# Patient Record
Sex: Male | Born: 1940 | Race: White | Hispanic: No | Marital: Married | Smoking: Former smoker
Health system: Southern US, Community
[De-identification: ages and names within clinical notes are randomized; demographics above are authoritative.]

## PROBLEM LIST (undated history)

## (undated) DIAGNOSIS — K219 Gastro-esophageal reflux disease without esophagitis: Secondary | ICD-10-CM

## (undated) DIAGNOSIS — K222 Esophageal obstruction: Secondary | ICD-10-CM

## (undated) DIAGNOSIS — M1990 Unspecified osteoarthritis, unspecified site: Secondary | ICD-10-CM

## (undated) DIAGNOSIS — M8580 Other specified disorders of bone density and structure, unspecified site: Secondary | ICD-10-CM

## (undated) DIAGNOSIS — K635 Polyp of colon: Secondary | ICD-10-CM

## (undated) DIAGNOSIS — G4730 Sleep apnea, unspecified: Secondary | ICD-10-CM

## (undated) DIAGNOSIS — H269 Unspecified cataract: Secondary | ICD-10-CM

## (undated) DIAGNOSIS — G2581 Restless legs syndrome: Secondary | ICD-10-CM

## (undated) DIAGNOSIS — G43909 Migraine, unspecified, not intractable, without status migrainosus: Secondary | ICD-10-CM

## (undated) DIAGNOSIS — K649 Unspecified hemorrhoids: Secondary | ICD-10-CM

## (undated) DIAGNOSIS — C4490 Unspecified malignant neoplasm of skin, unspecified: Secondary | ICD-10-CM

## (undated) DIAGNOSIS — E785 Hyperlipidemia, unspecified: Secondary | ICD-10-CM

## (undated) DIAGNOSIS — D509 Iron deficiency anemia, unspecified: Secondary | ICD-10-CM

## (undated) HISTORY — DX: Migraine, unspecified, not intractable, without status migrainosus: G43.909

## (undated) HISTORY — DX: Iron deficiency anemia, unspecified: D50.9

## (undated) HISTORY — DX: Gastro-esophageal reflux disease without esophagitis: K21.9

## (undated) HISTORY — DX: Other specified disorders of bone density and structure, unspecified site: M85.80

## (undated) HISTORY — DX: Unspecified cataract: H26.9

## (undated) HISTORY — DX: Unspecified hemorrhoids: K64.9

## (undated) HISTORY — PX: OTHER SURGICAL HISTORY: SHX169

## (undated) HISTORY — DX: Unspecified malignant neoplasm of skin, unspecified: C44.90

## (undated) HISTORY — PX: EYE SURGERY: SHX253

## (undated) HISTORY — PX: BREAST BIOPSY: SHX20

## (undated) HISTORY — DX: Restless legs syndrome: G25.81

## (undated) HISTORY — DX: Hyperlipidemia, unspecified: E78.5

## (undated) HISTORY — PX: ROTATOR CUFF REPAIR: SHX139

## (undated) HISTORY — PX: INGUINAL HERNIA REPAIR: SUR1180

## (undated) HISTORY — DX: Esophageal obstruction: K22.2

## (undated) HISTORY — DX: Polyp of colon: K63.5

## (undated) HISTORY — DX: Unspecified osteoarthritis, unspecified site: M19.90

---

## 1998-11-13 DIAGNOSIS — K635 Polyp of colon: Secondary | ICD-10-CM

## 1998-11-13 HISTORY — DX: Polyp of colon: K63.5

## 2004-11-13 DIAGNOSIS — K649 Unspecified hemorrhoids: Secondary | ICD-10-CM

## 2004-11-13 HISTORY — DX: Unspecified hemorrhoids: K64.9

## 2006-03-20 DIAGNOSIS — G2581 Restless legs syndrome: Secondary | ICD-10-CM

## 2006-03-20 DIAGNOSIS — D509 Iron deficiency anemia, unspecified: Secondary | ICD-10-CM

## 2006-03-20 HISTORY — DX: Iron deficiency anemia, unspecified: D50.9

## 2006-03-20 HISTORY — DX: Restless legs syndrome: G25.81

## 2011-01-02 DIAGNOSIS — H269 Unspecified cataract: Secondary | ICD-10-CM

## 2011-01-02 HISTORY — DX: Unspecified cataract: H26.9

## 2012-01-04 DIAGNOSIS — C4490 Unspecified malignant neoplasm of skin, unspecified: Secondary | ICD-10-CM

## 2012-01-04 HISTORY — DX: Unspecified malignant neoplasm of skin, unspecified: C44.90

## 2012-01-08 DIAGNOSIS — Z125 Encounter for screening for malignant neoplasm of prostate: Secondary | ICD-10-CM | POA: Diagnosis not present

## 2012-01-08 DIAGNOSIS — D509 Iron deficiency anemia, unspecified: Secondary | ICD-10-CM | POA: Diagnosis not present

## 2012-01-08 DIAGNOSIS — E7800 Pure hypercholesterolemia, unspecified: Secondary | ICD-10-CM | POA: Diagnosis not present

## 2012-01-24 DIAGNOSIS — K219 Gastro-esophageal reflux disease without esophagitis: Secondary | ICD-10-CM | POA: Diagnosis not present

## 2012-01-24 DIAGNOSIS — J3489 Other specified disorders of nose and nasal sinuses: Secondary | ICD-10-CM | POA: Diagnosis not present

## 2012-01-24 DIAGNOSIS — M899 Disorder of bone, unspecified: Secondary | ICD-10-CM | POA: Diagnosis not present

## 2012-01-24 DIAGNOSIS — D509 Iron deficiency anemia, unspecified: Secondary | ICD-10-CM | POA: Diagnosis not present

## 2012-01-24 DIAGNOSIS — M949 Disorder of cartilage, unspecified: Secondary | ICD-10-CM | POA: Diagnosis not present

## 2012-01-24 DIAGNOSIS — E7800 Pure hypercholesterolemia, unspecified: Secondary | ICD-10-CM | POA: Diagnosis not present

## 2012-01-24 DIAGNOSIS — R209 Unspecified disturbances of skin sensation: Secondary | ICD-10-CM | POA: Diagnosis not present

## 2012-02-08 DIAGNOSIS — D485 Neoplasm of uncertain behavior of skin: Secondary | ICD-10-CM | POA: Diagnosis not present

## 2012-02-08 DIAGNOSIS — L578 Other skin changes due to chronic exposure to nonionizing radiation: Secondary | ICD-10-CM | POA: Diagnosis not present

## 2012-02-08 DIAGNOSIS — L821 Other seborrheic keratosis: Secondary | ICD-10-CM | POA: Diagnosis not present

## 2012-02-08 DIAGNOSIS — L570 Actinic keratosis: Secondary | ICD-10-CM | POA: Diagnosis not present

## 2012-02-12 DIAGNOSIS — L570 Actinic keratosis: Secondary | ICD-10-CM | POA: Diagnosis not present

## 2012-02-12 DIAGNOSIS — D2330 Other benign neoplasm of skin of unspecified part of face: Secondary | ICD-10-CM | POA: Diagnosis not present

## 2012-02-29 DIAGNOSIS — L570 Actinic keratosis: Secondary | ICD-10-CM | POA: Diagnosis not present

## 2012-04-11 DIAGNOSIS — M545 Low back pain: Secondary | ICD-10-CM | POA: Diagnosis not present

## 2012-04-11 DIAGNOSIS — M999 Biomechanical lesion, unspecified: Secondary | ICD-10-CM | POA: Diagnosis not present

## 2012-04-29 DIAGNOSIS — M999 Biomechanical lesion, unspecified: Secondary | ICD-10-CM | POA: Diagnosis not present

## 2012-04-29 DIAGNOSIS — M546 Pain in thoracic spine: Secondary | ICD-10-CM | POA: Diagnosis not present

## 2012-05-28 DIAGNOSIS — M818 Other osteoporosis without current pathological fracture: Secondary | ICD-10-CM | POA: Diagnosis not present

## 2012-05-28 DIAGNOSIS — Z7983 Long term (current) use of bisphosphonates: Secondary | ICD-10-CM | POA: Diagnosis not present

## 2012-05-28 DIAGNOSIS — Z5181 Encounter for therapeutic drug level monitoring: Secondary | ICD-10-CM | POA: Diagnosis not present

## 2012-05-28 DIAGNOSIS — M8440XA Pathological fracture, unspecified site, initial encounter for fracture: Secondary | ICD-10-CM | POA: Diagnosis not present

## 2012-05-28 DIAGNOSIS — Z713 Dietary counseling and surveillance: Secondary | ICD-10-CM | POA: Diagnosis not present

## 2012-05-28 DIAGNOSIS — M8448XA Pathological fracture, other site, initial encounter for fracture: Secondary | ICD-10-CM | POA: Diagnosis not present

## 2012-06-22 DIAGNOSIS — S61209A Unspecified open wound of unspecified finger without damage to nail, initial encounter: Secondary | ICD-10-CM | POA: Diagnosis not present

## 2012-07-01 DIAGNOSIS — T148XXA Other injury of unspecified body region, initial encounter: Secondary | ICD-10-CM | POA: Diagnosis not present

## 2012-07-01 DIAGNOSIS — T63481A Toxic effect of venom of other arthropod, accidental (unintentional), initial encounter: Secondary | ICD-10-CM | POA: Diagnosis not present

## 2012-07-01 DIAGNOSIS — T6391XA Toxic effect of contact with unspecified venomous animal, accidental (unintentional), initial encounter: Secondary | ICD-10-CM | POA: Diagnosis not present

## 2012-07-01 DIAGNOSIS — T63461A Toxic effect of venom of wasps, accidental (unintentional), initial encounter: Secondary | ICD-10-CM | POA: Diagnosis not present

## 2012-07-15 DIAGNOSIS — Z7982 Long term (current) use of aspirin: Secondary | ICD-10-CM | POA: Diagnosis not present

## 2012-07-15 DIAGNOSIS — M79609 Pain in unspecified limb: Secondary | ICD-10-CM | POA: Diagnosis not present

## 2012-07-15 DIAGNOSIS — X58XXXA Exposure to other specified factors, initial encounter: Secondary | ICD-10-CM | POA: Diagnosis not present

## 2012-07-15 DIAGNOSIS — S5000XA Contusion of unspecified elbow, initial encounter: Secondary | ICD-10-CM | POA: Diagnosis not present

## 2012-07-15 DIAGNOSIS — I10 Essential (primary) hypertension: Secondary | ICD-10-CM | POA: Diagnosis not present

## 2012-07-15 DIAGNOSIS — S40029A Contusion of unspecified upper arm, initial encounter: Secondary | ICD-10-CM | POA: Diagnosis not present

## 2012-07-15 DIAGNOSIS — Z79899 Other long term (current) drug therapy: Secondary | ICD-10-CM | POA: Diagnosis not present

## 2012-08-05 DIAGNOSIS — D509 Iron deficiency anemia, unspecified: Secondary | ICD-10-CM | POA: Diagnosis not present

## 2012-08-05 DIAGNOSIS — Z125 Encounter for screening for malignant neoplasm of prostate: Secondary | ICD-10-CM | POA: Diagnosis not present

## 2012-08-05 DIAGNOSIS — C4442 Squamous cell carcinoma of skin of scalp and neck: Secondary | ICD-10-CM | POA: Diagnosis not present

## 2012-08-05 DIAGNOSIS — E559 Vitamin D deficiency, unspecified: Secondary | ICD-10-CM | POA: Diagnosis not present

## 2012-08-05 DIAGNOSIS — K219 Gastro-esophageal reflux disease without esophagitis: Secondary | ICD-10-CM | POA: Diagnosis not present

## 2012-08-05 DIAGNOSIS — E7800 Pure hypercholesterolemia, unspecified: Secondary | ICD-10-CM | POA: Diagnosis not present

## 2012-08-07 DIAGNOSIS — L578 Other skin changes due to chronic exposure to nonionizing radiation: Secondary | ICD-10-CM | POA: Diagnosis not present

## 2012-08-07 DIAGNOSIS — L570 Actinic keratosis: Secondary | ICD-10-CM | POA: Diagnosis not present

## 2012-08-07 DIAGNOSIS — L821 Other seborrheic keratosis: Secondary | ICD-10-CM | POA: Diagnosis not present

## 2012-08-07 DIAGNOSIS — D485 Neoplasm of uncertain behavior of skin: Secondary | ICD-10-CM | POA: Diagnosis not present

## 2012-08-09 DIAGNOSIS — L988 Other specified disorders of the skin and subcutaneous tissue: Secondary | ICD-10-CM | POA: Diagnosis not present

## 2012-09-03 DIAGNOSIS — H26499 Other secondary cataract, unspecified eye: Secondary | ICD-10-CM | POA: Diagnosis not present

## 2012-09-03 DIAGNOSIS — H43819 Vitreous degeneration, unspecified eye: Secondary | ICD-10-CM | POA: Diagnosis not present

## 2012-09-03 DIAGNOSIS — H04129 Dry eye syndrome of unspecified lacrimal gland: Secondary | ICD-10-CM | POA: Diagnosis not present

## 2012-09-03 DIAGNOSIS — D3130 Benign neoplasm of unspecified choroid: Secondary | ICD-10-CM | POA: Diagnosis not present

## 2012-09-13 DIAGNOSIS — Z23 Encounter for immunization: Secondary | ICD-10-CM | POA: Diagnosis not present

## 2012-12-10 DIAGNOSIS — R209 Unspecified disturbances of skin sensation: Secondary | ICD-10-CM | POA: Diagnosis not present

## 2013-01-23 DIAGNOSIS — L578 Other skin changes due to chronic exposure to nonionizing radiation: Secondary | ICD-10-CM | POA: Diagnosis not present

## 2013-01-31 DIAGNOSIS — M546 Pain in thoracic spine: Secondary | ICD-10-CM | POA: Diagnosis not present

## 2013-01-31 DIAGNOSIS — M999 Biomechanical lesion, unspecified: Secondary | ICD-10-CM | POA: Diagnosis not present

## 2013-02-11 DIAGNOSIS — L570 Actinic keratosis: Secondary | ICD-10-CM | POA: Diagnosis not present

## 2013-04-29 DIAGNOSIS — M546 Pain in thoracic spine: Secondary | ICD-10-CM | POA: Diagnosis not present

## 2013-04-29 DIAGNOSIS — M999 Biomechanical lesion, unspecified: Secondary | ICD-10-CM | POA: Diagnosis not present

## 2013-05-15 DIAGNOSIS — N63 Unspecified lump in unspecified breast: Secondary | ICD-10-CM | POA: Diagnosis not present

## 2013-05-15 DIAGNOSIS — M84429A Pathological fracture, unspecified humerus, initial encounter for fracture: Secondary | ICD-10-CM | POA: Diagnosis not present

## 2013-05-15 DIAGNOSIS — R079 Chest pain, unspecified: Secondary | ICD-10-CM | POA: Diagnosis not present

## 2013-05-15 DIAGNOSIS — M818 Other osteoporosis without current pathological fracture: Secondary | ICD-10-CM | POA: Diagnosis not present

## 2013-05-19 LAB — HM DEXA SCAN

## 2013-05-22 DIAGNOSIS — R079 Chest pain, unspecified: Secondary | ICD-10-CM | POA: Diagnosis not present

## 2013-05-26 DIAGNOSIS — H43819 Vitreous degeneration, unspecified eye: Secondary | ICD-10-CM | POA: Diagnosis not present

## 2013-05-29 DIAGNOSIS — Z5181 Encounter for therapeutic drug level monitoring: Secondary | ICD-10-CM | POA: Diagnosis not present

## 2013-05-29 DIAGNOSIS — Z7983 Long term (current) use of bisphosphonates: Secondary | ICD-10-CM | POA: Diagnosis not present

## 2013-05-29 DIAGNOSIS — M810 Age-related osteoporosis without current pathological fracture: Secondary | ICD-10-CM | POA: Diagnosis not present

## 2013-05-29 DIAGNOSIS — M8448XA Pathological fracture, other site, initial encounter for fracture: Secondary | ICD-10-CM | POA: Diagnosis not present

## 2013-06-05 DIAGNOSIS — M810 Age-related osteoporosis without current pathological fracture: Secondary | ICD-10-CM | POA: Diagnosis not present

## 2013-06-05 DIAGNOSIS — M8448XA Pathological fracture, other site, initial encounter for fracture: Secondary | ICD-10-CM | POA: Diagnosis not present

## 2013-06-05 DIAGNOSIS — Z7983 Long term (current) use of bisphosphonates: Secondary | ICD-10-CM | POA: Diagnosis not present

## 2013-06-16 DIAGNOSIS — D509 Iron deficiency anemia, unspecified: Secondary | ICD-10-CM | POA: Diagnosis not present

## 2013-06-16 DIAGNOSIS — E7800 Pure hypercholesterolemia, unspecified: Secondary | ICD-10-CM | POA: Diagnosis not present

## 2013-06-16 DIAGNOSIS — Z125 Encounter for screening for malignant neoplasm of prostate: Secondary | ICD-10-CM | POA: Diagnosis not present

## 2013-06-16 DIAGNOSIS — K219 Gastro-esophageal reflux disease without esophagitis: Secondary | ICD-10-CM | POA: Diagnosis not present

## 2013-06-24 DIAGNOSIS — M999 Biomechanical lesion, unspecified: Secondary | ICD-10-CM | POA: Diagnosis not present

## 2013-06-24 DIAGNOSIS — M545 Low back pain: Secondary | ICD-10-CM | POA: Diagnosis not present

## 2013-07-01 DIAGNOSIS — H43819 Vitreous degeneration, unspecified eye: Secondary | ICD-10-CM | POA: Diagnosis not present

## 2013-07-01 DIAGNOSIS — H04129 Dry eye syndrome of unspecified lacrimal gland: Secondary | ICD-10-CM | POA: Diagnosis not present

## 2013-07-01 DIAGNOSIS — H01009 Unspecified blepharitis unspecified eye, unspecified eyelid: Secondary | ICD-10-CM | POA: Diagnosis not present

## 2013-07-08 DIAGNOSIS — E7800 Pure hypercholesterolemia, unspecified: Secondary | ICD-10-CM | POA: Diagnosis not present

## 2013-07-08 DIAGNOSIS — Z125 Encounter for screening for malignant neoplasm of prostate: Secondary | ICD-10-CM | POA: Diagnosis not present

## 2013-07-28 DIAGNOSIS — L821 Other seborrheic keratosis: Secondary | ICD-10-CM | POA: Diagnosis not present

## 2013-07-28 DIAGNOSIS — D485 Neoplasm of uncertain behavior of skin: Secondary | ICD-10-CM | POA: Diagnosis not present

## 2013-07-28 DIAGNOSIS — L578 Other skin changes due to chronic exposure to nonionizing radiation: Secondary | ICD-10-CM | POA: Diagnosis not present

## 2013-07-28 DIAGNOSIS — L570 Actinic keratosis: Secondary | ICD-10-CM | POA: Diagnosis not present

## 2013-07-31 DIAGNOSIS — M999 Biomechanical lesion, unspecified: Secondary | ICD-10-CM | POA: Diagnosis not present

## 2013-07-31 DIAGNOSIS — M545 Low back pain: Secondary | ICD-10-CM | POA: Diagnosis not present

## 2013-08-01 DIAGNOSIS — L570 Actinic keratosis: Secondary | ICD-10-CM | POA: Diagnosis not present

## 2013-08-19 DIAGNOSIS — Z23 Encounter for immunization: Secondary | ICD-10-CM | POA: Diagnosis not present

## 2013-10-13 ENCOUNTER — Encounter: Payer: Self-pay | Admitting: Adult Health

## 2013-10-13 ENCOUNTER — Ambulatory Visit (INDEPENDENT_AMBULATORY_CARE_PROVIDER_SITE_OTHER): Payer: Medicare Other | Admitting: Adult Health

## 2013-10-13 VITALS — BP 116/78 | HR 73 | Temp 97.5°F | Resp 14 | Ht 70.0 in | Wt 213.0 lb

## 2013-10-13 DIAGNOSIS — E785 Hyperlipidemia, unspecified: Secondary | ICD-10-CM | POA: Diagnosis not present

## 2013-10-13 DIAGNOSIS — Z7689 Persons encountering health services in other specified circumstances: Secondary | ICD-10-CM | POA: Insufficient documentation

## 2013-10-13 MED ORDER — CVS OMEGA-3 PO CAPS: ORAL_CAPSULE | ORAL | Status: DC

## 2013-10-13 NOTE — Assessment & Plan Note (Addendum)
Reviewed past medical, surgical, family and social hx. Request records from previous PCP in [place]. Normal exam.

## 2013-10-13 NOTE — Progress Notes (Signed)
Subjective:    Patient ID: Jamie Zhang, male    DOB: 1941/10/16, 72 y.o.   MRN: [id number]  HPI  [name] presents to clinic to establish care. He recently moved to this area from [place] with his wife. Currently feeling well. Reports hx of mild HLD.    Past Medical History  Diagnosis Date  . Cataract 01/02/2011    Bilateral - s/p excision  . Skin cancer 01/04/2012    Basal cell nose, squamous cell scalp - Dr. Herma Ard  . Iron deficiency anemia 03/20/2006    Donating too much blood  . Migraine     No longer has problems  . HLD (hyperlipidemia)   . Colon polyps 2000    Hyperplastic - Repeat colonoscopy 2015  . Schatzki's ring   . GERD (gastroesophageal reflux disease)     Pantoprazole  . Osteopenia     on Fosamax  . Hemorrhoids 2006  . RLS (restless legs syndrome) 03/20/2006    2/2 iron deficiency     Past Surgical History  Procedure Laterality Date  . Cataract surgery Bilateral   . Rotator cuff repair Left   . Breast biopsy Left     benign  . Inguinal hernia repair Left      Family History  Problem Relation Age of Onset  . Alcohol abuse Mother   . Cancer Father 66    Throat Cancer - died  . Alcohol abuse Father   . Cancer Sister 56    Breast Cancer     History   Social History  . Marital Status: Married    Spouse Name: N/A    Number of Children: 2  . Years of Education: 12   Occupational History  . Manager     Worked for [employer]  . Real Estate   . Manager for [employer]    Social History Main Topics  . Smoking status: Former Smoker -- 2.00 packs/day for 21 years    Types: Cigarettes  . Smokeless tobacco: Not on file  . Alcohol Use: No     Comment: Occasional wine glass  . Drug Use: No  . Sexual Activity: Not on file   Other Topics Concern  . Not on file   Social History Narrative   [name] grew up in [place], [place]. He joined the [employer] right out of [employer] and served for 2.5 years then was active in [employer]. 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 He then worked in [employer] and afterward became a Production designer, theatre/television/film for [employer]. He recently moved to [place] with his wife. They have two daughters that are still living in [place]. As a hobby he enjoys woodwork mainly with cabinet work.      Review of Systems  Constitutional: Negative.   HENT: Negative.   Eyes: Negative.   Respiratory: Negative.   Cardiovascular: Negative.   Gastrointestinal: Negative.   Endocrine: Negative.   Genitourinary: Negative.   Musculoskeletal: Negative.   Skin: Negative.   Allergic/Immunologic: Negative.   Neurological: Negative.   Hematological: Negative.   Psychiatric/Behavioral: Negative.        Objective:   Physical Exam  Constitutional: He is oriented to person, place, and time. He appears well-developed and well-nourished. No distress.  HENT:  Head: Normocephalic and atraumatic.  Nose: Nose normal.  Mouth/Throat: Oropharynx is clear and moist.  Neck: Normal range of motion. Neck supple.  Cardiovascular:  Normal rate, regular rhythm, normal heart sounds and intact distal pulses.  Exam reveals no gallop and no friction rub.   No murmur heard. Pulmonary/Chest: Effort normal and breath sounds normal. No respiratory distress. He has no wheezes. He has no rales.  Abdominal: Soft. Bowel sounds are normal.  Musculoskeletal: Normal range of motion. He exhibits no edema.  Neurological: He is alert and oriented to person, place, and time.  Skin: Skin is warm and dry.  Psychiatric: He has a normal mood and affect. His behavior is normal. Thought content normal.   BP 116/78  Pulse 73  Temp(Src) 97.5 F (36.4 C) (Oral)  Resp 14  Ht 5\' 10"  (1.778 m)  Wt 213 lb (96.616 kg)  BMI 30.56 kg/m2  SpO2 97%        Assessment & Plan:

## 2013-10-13 NOTE — Patient Instructions (Signed)
   Thank you for choosing [facility] [facility] for your health care needs.  Below is information on activating your MyChart [employer]. The activation code is located at the end of this form.  Please return in February for your cholesterol check.

## 2013-10-13 NOTE — Assessment & Plan Note (Signed)
Labs done in August 2014 shows mild elevation of triglycerides. Start omega 3 fatty acid. Return in February for follow up and lab check of cholesterol. Continue to follow healthy, low saturated fat diet. Avoid fried foods, concentrated sweets.

## 2013-10-13 NOTE — Progress Notes (Signed)
Pre visit review using our clinic review tool, if applicable. No additional management support is needed unless otherwise documented below in the visit note. 

## 2013-10-15 DIAGNOSIS — D485 Neoplasm of uncertain behavior of skin: Secondary | ICD-10-CM | POA: Diagnosis not present

## 2013-10-15 DIAGNOSIS — Z85828 Personal history of other malignant neoplasm of skin: Secondary | ICD-10-CM | POA: Diagnosis not present

## 2013-10-15 DIAGNOSIS — L570 Actinic keratosis: Secondary | ICD-10-CM | POA: Diagnosis not present

## 2013-11-28 DIAGNOSIS — H43399 Other vitreous opacities, unspecified eye: Secondary | ICD-10-CM | POA: Diagnosis not present

## 2013-12-15 ENCOUNTER — Telehealth: Payer: Self-pay | Admitting: *Deleted

## 2013-12-15 ENCOUNTER — Encounter: Payer: Self-pay | Admitting: Adult Health

## 2013-12-15 ENCOUNTER — Other Ambulatory Visit (INDEPENDENT_AMBULATORY_CARE_PROVIDER_SITE_OTHER): Payer: Medicare Other

## 2013-12-15 DIAGNOSIS — E785 Hyperlipidemia, unspecified: Secondary | ICD-10-CM

## 2013-12-15 LAB — LIPID PANEL
CHOLESTEROL: 191 mg/dL (ref 0–200)
HDL: 49 mg/dL (ref >39.00)
LDL Cholesterol: 104 mg/dL — ABNORMAL HIGH (ref 0–99)
TRIGLYCERIDES: 189 mg/dL — AB (ref 0.0–149.0)
Total CHOL/HDL Ratio: 4
VLDL: 37.8 mg/dL (ref 0.0–40.0)

## 2013-12-15 NOTE — Telephone Encounter (Signed)
Noted and done

## 2013-12-15 NOTE — Telephone Encounter (Signed)
Patient came in this morning, what labs does he need?

## 2013-12-15 NOTE — Telephone Encounter (Signed)
Lipids. Thank you

## 2013-12-19 ENCOUNTER — Encounter: Payer: Self-pay | Admitting: Adult Health

## 2013-12-20 ENCOUNTER — Other Ambulatory Visit: Payer: Self-pay | Admitting: Adult Health

## 2013-12-20 DIAGNOSIS — K649 Unspecified hemorrhoids: Secondary | ICD-10-CM

## 2013-12-22 DIAGNOSIS — C44611 Basal cell carcinoma of skin of unspecified upper limb, including shoulder: Secondary | ICD-10-CM | POA: Diagnosis not present

## 2013-12-23 ENCOUNTER — Encounter: Payer: Self-pay | Admitting: Adult Health

## 2013-12-25 DIAGNOSIS — K644 Residual hemorrhoidal skin tags: Secondary | ICD-10-CM | POA: Diagnosis not present

## 2013-12-25 DIAGNOSIS — R198 Other specified symptoms and signs involving the digestive system and abdomen: Secondary | ICD-10-CM | POA: Diagnosis not present

## 2013-12-29 ENCOUNTER — Ambulatory Visit (INDEPENDENT_AMBULATORY_CARE_PROVIDER_SITE_OTHER): Payer: Medicare Other | Admitting: Adult Health

## 2013-12-29 ENCOUNTER — Encounter: Payer: Self-pay | Admitting: Adult Health

## 2013-12-29 VITALS — BP 128/94 | HR 64 | Temp 97.9°F | Resp 14 | Wt 214.0 lb

## 2013-12-29 DIAGNOSIS — M25559 Pain in unspecified hip: Secondary | ICD-10-CM

## 2013-12-29 DIAGNOSIS — M7070 Other bursitis of hip, unspecified hip: Secondary | ICD-10-CM | POA: Insufficient documentation

## 2013-12-29 DIAGNOSIS — M25551 Pain in right hip: Secondary | ICD-10-CM

## 2013-12-29 NOTE — Progress Notes (Signed)
Patient ID: Jamie Zhang, male   DOB: Sep 07, 1941, 73 y.o.   MRN: [id number]    Subjective:    Patient ID: Jamie Zhang, male    DOB: Mar 22, 1941, 73 y.o.   MRN: [id number]  HPI  Jamie Zhang presents with 1 month history of right hip pain, buttocks. He saw his chiropractor who recommended he be evaluated for possible referral to Ortho. He denies injury to the area. There is slight radiation of pain in the buttocks area. Has taken tylenol and some ibuprofen. He reports currently not taking any medications.   Past Medical History  Diagnosis Date  . Cataract 01/02/2011    Bilateral - s/p excision  . Skin cancer 01/04/2012    Basal cell nose, squamous cell scalp - Dr. Ula Lingo  . Iron deficiency anemia 03/20/2006    Donating too much blood  . Migraine     No longer has problems  . HLD (hyperlipidemia)   . Colon polyps 2000    Hyperplastic - Repeat colonoscopy 2015  . Schatzki's ring   . GERD (gastroesophageal reflux disease)     Pantoprazole  . Osteopenia     on Fosamax  . Hemorrhoids 2006  . RLS (restless legs syndrome) 03/20/2006    2/2 iron deficiency    Current Outpatient Prescriptions on File Prior to Visit  Medication Sig Dispense Refill  . alendronate (FOSAMAX) 70 MG tablet Take 70 mg by mouth once a week. Take with a full glass of water on an empty stomach.      . cholecalciferol (VITAMIN D) 400 UNITS TABS tablet Take 2,000 Units by mouth daily.      . Flax Oil-Fish Oil-Borage Oil ([facility] OMEGA-3) CAPS Take per instructions  30 capsule  6  . fluticasone (FLONASE) 50 MCG/ACT nasal spray Place into both nostrils daily.      . pantoprazole (PROTONIX) 40 MG tablet Take 40 mg by mouth daily.       No current facility-administered medications on file prior to visit.     Review of Systems  Musculoskeletal:       Right hip, buttocks pain  All other systems reviewed and are negative.       Objective:  BP 128/94  Pulse 64  Temp(Src) 97.9 F (36.6 C) (Oral)  Resp 14  Wt 214 lb (97.07  kg)  SpO2 95%   Physical Exam  Constitutional: He is oriented to person, place, and time. No distress.  Cardiovascular: Normal rate and regular rhythm.   Pulmonary/Chest: Effort normal. No respiratory distress.  Musculoskeletal: Normal range of motion. He exhibits tenderness. He exhibits no edema.  Point tenderness right, middle, lateral side of buttocks, greater trochanter area  Neurological: He is alert and oriented to person, place, and time.  Skin: Skin is warm and dry.  Psychiatric: He has a normal mood and affect. His behavior is normal. Judgment and thought content normal.       Assessment & Plan:    1. Right hip pain Suspect piriformis syndrome vs. Greater trochanter bursitis. Was seen by his chiropractor who recommended he be evaluated for referral to Ortho. Refer to Dr. Sharlet Salina. Instructed pt to apply ice, take ibuprofen for inflammation, piriformis stretches. Appt with Dr. Sharlet Salina on March 2 at 8 am.

## 2013-12-29 NOTE — Patient Instructions (Signed)
  Apply ice to the affected area 3-4 times a day. Do this for 15 minutes at a time. You may alternate with heat  Ibuprofen 400 mg every 6 hours as needed for pain or discomfort.  We have set you up to see Dr. Sharlet Salina. Please see Amber for the appointment details.  Stretching exercises to help release the Piriformis muscle

## 2013-12-29 NOTE — Progress Notes (Signed)
Pre visit review using our clinic review tool, if applicable. No additional management support is needed unless otherwise documented below in the visit note. 

## 2014-01-13 DIAGNOSIS — M76899 Other specified enthesopathies of unspecified lower limb, excluding foot: Secondary | ICD-10-CM | POA: Diagnosis not present

## 2014-01-14 ENCOUNTER — Other Ambulatory Visit: Payer: Self-pay | Admitting: *Deleted

## 2014-01-14 MED ORDER — PANTOPRAZOLE SODIUM 40 MG PO TBEC: 40.0000 mg | DELAYED_RELEASE_TABLET | Freq: Every day | ORAL | Status: DC

## 2014-01-14 MED ORDER — ALENDRONATE SODIUM 70 MG PO TABS: 70.0000 mg | ORAL_TABLET | ORAL | Status: DC

## 2014-01-14 MED ORDER — FLUTICASONE PROPIONATE 50 MCG/ACT NA SUSP: 2.0000 | Freq: Every day | NASAL | Status: DC

## 2014-01-28 ENCOUNTER — Other Ambulatory Visit: Payer: Self-pay | Admitting: *Deleted

## 2014-01-28 MED ORDER — FLUTICASONE PROPIONATE 50 MCG/ACT NA SUSP
2.0000 | Freq: Every day | NASAL | Status: DC
Start: 2014-01-28 — End: 2014-06-24

## 2014-01-28 MED ORDER — PANTOPRAZOLE SODIUM 40 MG PO TBEC: 40.0000 mg | DELAYED_RELEASE_TABLET | Freq: Every day | ORAL | Status: DC

## 2014-01-28 MED ORDER — ALENDRONATE SODIUM 70 MG PO TABS: 70.0000 mg | ORAL_TABLET | ORAL | Status: DC

## 2014-03-13 ENCOUNTER — Emergency Department: Payer: Self-pay | Admitting: Emergency Medicine

## 2014-03-13 DIAGNOSIS — I714 Abdominal aortic aneurysm, without rupture, unspecified: Secondary | ICD-10-CM | POA: Diagnosis not present

## 2014-03-13 DIAGNOSIS — N201 Calculus of ureter: Secondary | ICD-10-CM | POA: Diagnosis not present

## 2014-03-13 DIAGNOSIS — Z79899 Other long term (current) drug therapy: Secondary | ICD-10-CM | POA: Diagnosis not present

## 2014-03-13 DIAGNOSIS — K429 Umbilical hernia without obstruction or gangrene: Secondary | ICD-10-CM | POA: Diagnosis not present

## 2014-03-13 DIAGNOSIS — I10 Essential (primary) hypertension: Secondary | ICD-10-CM | POA: Diagnosis not present

## 2014-03-13 LAB — COMPREHENSIVE METABOLIC PANEL
ANION GAP: 7 (ref 7–16)
AST: 21 U/L (ref 15–37)
Albumin: 3.8 g/dL (ref 3.4–5.0)
Alkaline Phosphatase: 53 U/L
BUN: 24 mg/dL — ABNORMAL HIGH (ref 7–18)
Bilirubin,Total: 1.5 mg/dL — ABNORMAL HIGH (ref 0.2–1.0)
CALCIUM: 9.1 mg/dL (ref 8.5–10.1)
CREATININE: 1.15 mg/dL (ref 0.60–1.30)
Chloride: 108 mmol/L — ABNORMAL HIGH (ref 98–107)
Co2: 25 mmol/L (ref 21–32)
EGFR (Non-African Amer.): >60
GLUCOSE: 153 mg/dL — AB (ref 65–99)
Osmolality: 286 (ref 275–301)
POTASSIUM: 3.8 mmol/L (ref 3.5–5.1)
SGPT (ALT): 38 U/L (ref 12–78)
Sodium: 140 mmol/L (ref 136–145)
TOTAL PROTEIN: 7.1 g/dL (ref 6.4–8.2)

## 2014-03-13 LAB — URINALYSIS, COMPLETE
Bacteria: NONE SEEN
Bilirubin,UR: NEGATIVE
GLUCOSE, UR: NEGATIVE mg/dL (ref 0–75)
KETONE: NEGATIVE
Leukocyte Esterase: NEGATIVE
Nitrite: NEGATIVE
PH: 6 (ref 4.5–8.0)
Protein: NEGATIVE
RBC,UR: 98 /HPF (ref 0–5)
SPECIFIC GRAVITY: 1.02 (ref 1.003–1.030)
SQUAMOUS EPITHELIAL: NONE SEEN
WBC UR: 1 /HPF (ref 0–5)

## 2014-03-13 LAB — CBC WITH DIFFERENTIAL/PLATELET
Basophil #: 0.1 10*3/uL (ref 0.0–0.1)
Basophil %: 1 %
EOS PCT: 0.9 %
Eosinophil #: 0.1 10*3/uL (ref 0.0–0.7)
HCT: 42.5 % (ref 40.0–52.0)
HGB: 14.6 g/dL (ref 13.0–18.0)
LYMPHS PCT: 12.9 %
Lymphocyte #: 1.1 10*3/uL (ref 1.0–3.6)
MCH: 32.1 pg (ref 26.0–34.0)
MCHC: 34.5 g/dL (ref 32.0–36.0)
MCV: 93 fL (ref 80–100)
Monocyte #: 0.6 x10 3/mm (ref 0.2–1.0)
Monocyte %: 6.8 %
NEUTROS PCT: 78.4 %
Neutrophil #: 6.4 10*3/uL (ref 1.4–6.5)
Platelet: 204 10*3/uL (ref 150–440)
RBC: 4.56 10*6/uL (ref 4.40–5.90)
RDW: 14 % (ref 11.5–14.5)
WBC: 8.2 10*3/uL (ref 3.8–10.6)

## 2014-03-13 LAB — LIPASE, BLOOD: Lipase: 228 U/L (ref 73–393)

## 2014-03-16 DIAGNOSIS — Z85828 Personal history of other malignant neoplasm of skin: Secondary | ICD-10-CM | POA: Diagnosis not present

## 2014-03-16 DIAGNOSIS — C44611 Basal cell carcinoma of skin of unspecified upper limb, including shoulder: Secondary | ICD-10-CM | POA: Diagnosis not present

## 2014-03-16 DIAGNOSIS — D485 Neoplasm of uncertain behavior of skin: Secondary | ICD-10-CM | POA: Diagnosis not present

## 2014-03-20 ENCOUNTER — Ambulatory Visit: Payer: No Typology Code available for payment source | Admitting: Adult Health

## 2014-04-01 ENCOUNTER — Encounter: Payer: Self-pay | Admitting: Adult Health

## 2014-04-01 ENCOUNTER — Ambulatory Visit (INDEPENDENT_AMBULATORY_CARE_PROVIDER_SITE_OTHER): Payer: Medicare Other | Admitting: Adult Health

## 2014-04-01 ENCOUNTER — Other Ambulatory Visit: Payer: Medicare Other

## 2014-04-01 VITALS — BP 116/68 | HR 69 | Temp 98.1°F | Resp 14 | Ht 70.0 in | Wt 223.8 lb

## 2014-04-01 DIAGNOSIS — I714 Abdominal aortic aneurysm, without rupture, unspecified: Secondary | ICD-10-CM | POA: Insufficient documentation

## 2014-04-01 DIAGNOSIS — E781 Pure hyperglyceridemia: Secondary | ICD-10-CM | POA: Diagnosis not present

## 2014-04-01 DIAGNOSIS — R209 Unspecified disturbances of skin sensation: Secondary | ICD-10-CM | POA: Diagnosis not present

## 2014-04-01 DIAGNOSIS — G629 Polyneuropathy, unspecified: Secondary | ICD-10-CM | POA: Insufficient documentation

## 2014-04-01 DIAGNOSIS — R202 Paresthesia of skin: Secondary | ICD-10-CM

## 2014-04-01 NOTE — Patient Instructions (Signed)
  Return for fasting blood work.  We will contact you with results once they are available

## 2014-04-01 NOTE — Progress Notes (Signed)
   Subjective:    Patient ID: Jamie Zhang, male    DOB: August 19, 1941, 73 y.o.   MRN: [id number]  HPI Pt is a pleasant 73 y/o male who presents for f/u HLD. His triglycerides were elevated in December. He was instructed to watch diet and increase physical activity. Reports visit to the ED on 03/13/14 for kidney stone which he passed. CT scan incidental finding 3.7 x 3.9 cm abdominal aortic aneurysm and atherosclerotic calcifications within the abdominal aorta and iliac vessels.   Per medical records from previous PCP in [place], pt with hypercalciuria. He has been taking calcium supplements with vitamin D 2/2 hx of osteoporosis. Up until now pt had not had any hx of kidney stones.  He reports having tingling of both feet. No hx of diabetes. Has had hx of iron deficiency anemia in the past 2/2 frequent blood donation. He has not been donating as often.   Review of Systems  Constitutional: Negative.   HENT: Negative.   Eyes: Negative.   Respiratory: Negative.   Cardiovascular: Negative.   Gastrointestinal: Negative.   Endocrine: Negative.   Genitourinary: Negative.   Musculoskeletal: Negative.   Skin: Negative.   Allergic/Immunologic: Negative.   Neurological: Negative.  Numbness: tingling in extremities.  Hematological: Negative.   Psychiatric/Behavioral: Negative.        Objective:   Physical Exam  Constitutional: He is oriented to person, place, and time. No distress.  Overweight, pleasant 73 y/o male  HENT:  Head: Normocephalic and atraumatic.  Eyes: Conjunctivae and EOM are normal.  Cardiovascular: Normal rate, regular rhythm, normal heart sounds and intact distal pulses.  Exam reveals no gallop and no friction rub.   No murmur heard. Pulmonary/Chest: Effort normal and breath sounds normal. No respiratory distress. He has no wheezes. He has no rales.  Musculoskeletal: Normal range of motion.  Neurological: He is alert and oriented to person, place, and time.  Skin: Skin is  warm and dry.  Psychiatric: He has a normal mood and affect. His behavior is normal. Judgment and thought content normal.   BP 116/68  Pulse 69  Temp(Src) 98.1 F (36.7 C) (Oral)  Resp 14  Ht 5\' 10"  (1.778 m)  Wt 223 lb 12 oz (101.492 kg)  BMI 32.10 kg/m2  SpO2 97%     Assessment & Plan:   1. Hypertriglyceridemia Recent vacation with reported dietary indiscretions. He is not fasting this morning. He will return for lipid panel.  - Lipid panel; Future  2. Tingling in extremities Hx of iron deficiency. I will check labs and if indicated check his iron and ferritin. Check b12 - CBC with Differential - Vitamin B12 - Comprehensive metabolic panel  3. Abdominal aortic aneurysm Incidental finding of a 3.7 x 3.9 cm aneurysm. Surveillance every 6-12 months with 4cm aneurysm. His is just shy of this size. Ultrasound/CT in 6 months. Aggressive CV risk management - statin and beta blocker if his b/p and pulse will tolerate. Continue to follow.

## 2014-04-01 NOTE — Progress Notes (Signed)
Pre visit review using our clinic review tool, if applicable. No additional management support is needed unless otherwise documented below in the visit note. 

## 2014-04-03 ENCOUNTER — Other Ambulatory Visit (INDEPENDENT_AMBULATORY_CARE_PROVIDER_SITE_OTHER): Payer: Medicare Other

## 2014-04-03 DIAGNOSIS — E781 Pure hyperglyceridemia: Secondary | ICD-10-CM

## 2014-04-03 LAB — CBC WITH DIFFERENTIAL/PLATELET
Basophils Absolute: 0.1 10*3/uL (ref 0.0–0.1)
Basophils Relative: 0.8 % (ref 0.0–3.0)
EOS ABS: 0.1 10*3/uL (ref 0.0–0.7)
Eosinophils Relative: 1.8 % (ref 0.0–5.0)
HEMATOCRIT: 46.2 % (ref 39.0–52.0)
HEMOGLOBIN: 15.6 g/dL (ref 13.0–17.0)
LYMPHS ABS: 1.5 10*3/uL (ref 0.7–4.0)
Lymphocytes Relative: 17.7 % (ref 12.0–46.0)
MCHC: 33.8 g/dL (ref 30.0–36.0)
MCV: 93.7 fl (ref 78.0–100.0)
Monocytes Absolute: 0.7 10*3/uL (ref 0.1–1.0)
Monocytes Relative: 8.6 % (ref 3.0–12.0)
NEUTROS ABS: 6 10*3/uL (ref 1.4–7.7)
Neutrophils Relative %: 71.1 % (ref 43.0–77.0)
Platelets: 199 10*3/uL (ref 150.0–400.0)
RBC: 4.92 Mil/uL (ref 4.22–5.81)
RDW: 13.7 % (ref 11.5–15.5)
WBC: 8.4 10*3/uL (ref 4.0–10.5)

## 2014-04-03 LAB — COMPREHENSIVE METABOLIC PANEL
ALK PHOS: 38 U/L — AB (ref 39–117)
ALT: 30 U/L (ref 0–53)
AST: 25 U/L (ref 0–37)
Albumin: 4.3 g/dL (ref 3.5–5.2)
BILIRUBIN TOTAL: 2.5 mg/dL — AB (ref 0.2–1.2)
BUN: 19 mg/dL (ref 6–23)
CALCIUM: 9.4 mg/dL (ref 8.4–10.5)
CO2: 25 mEq/L (ref 19–32)
CREATININE: 1.1 mg/dL (ref 0.4–1.5)
Chloride: 104 mEq/L (ref 96–112)
GFR: 71.04 mL/min (ref >60.00)
Glucose, Bld: 100 mg/dL — ABNORMAL HIGH (ref 70–99)
Potassium: 4.2 mEq/L (ref 3.5–5.1)
Sodium: 139 mEq/L (ref 135–145)
Total Protein: 7.2 g/dL (ref 6.0–8.3)

## 2014-04-03 LAB — LIPID PANEL
Cholesterol: 202 mg/dL — ABNORMAL HIGH (ref 0–200)
HDL: 47.2 mg/dL (ref >39.00)
LDL Cholesterol: 127 mg/dL — ABNORMAL HIGH (ref 0–99)
Total CHOL/HDL Ratio: 4
Triglycerides: 139 mg/dL (ref 0.0–149.0)
VLDL: 27.8 mg/dL (ref 0.0–40.0)

## 2014-04-03 LAB — VITAMIN B12: Vitamin B-12: 502 pg/mL (ref 211–911)

## 2014-04-06 ENCOUNTER — Encounter: Payer: Self-pay | Admitting: Adult Health

## 2014-04-06 ENCOUNTER — Other Ambulatory Visit: Payer: Self-pay | Admitting: Adult Health

## 2014-04-06 DIAGNOSIS — R17 Unspecified jaundice: Secondary | ICD-10-CM

## 2014-04-07 ENCOUNTER — Ambulatory Visit: Payer: Self-pay | Admitting: Gastroenterology

## 2014-04-07 DIAGNOSIS — D126 Benign neoplasm of colon, unspecified: Secondary | ICD-10-CM | POA: Diagnosis not present

## 2014-04-07 DIAGNOSIS — Z09 Encounter for follow-up examination after completed treatment for conditions other than malignant neoplasm: Secondary | ICD-10-CM | POA: Diagnosis not present

## 2014-04-07 DIAGNOSIS — K644 Residual hemorrhoidal skin tags: Secondary | ICD-10-CM | POA: Diagnosis not present

## 2014-04-07 DIAGNOSIS — Z86010 Personal history of colonic polyps: Secondary | ICD-10-CM | POA: Diagnosis not present

## 2014-04-07 DIAGNOSIS — K621 Rectal polyp: Secondary | ICD-10-CM | POA: Diagnosis not present

## 2014-04-07 DIAGNOSIS — M1990 Unspecified osteoarthritis, unspecified site: Secondary | ICD-10-CM | POA: Diagnosis not present

## 2014-04-07 DIAGNOSIS — Z79899 Other long term (current) drug therapy: Secondary | ICD-10-CM | POA: Diagnosis not present

## 2014-04-07 DIAGNOSIS — D129 Benign neoplasm of anus and anal canal: Secondary | ICD-10-CM | POA: Diagnosis not present

## 2014-04-07 DIAGNOSIS — Z87442 Personal history of urinary calculi: Secondary | ICD-10-CM | POA: Diagnosis not present

## 2014-04-07 DIAGNOSIS — K219 Gastro-esophageal reflux disease without esophagitis: Secondary | ICD-10-CM | POA: Diagnosis not present

## 2014-04-07 DIAGNOSIS — D128 Benign neoplasm of rectum: Secondary | ICD-10-CM | POA: Diagnosis not present

## 2014-04-07 DIAGNOSIS — Z1211 Encounter for screening for malignant neoplasm of colon: Secondary | ICD-10-CM | POA: Diagnosis not present

## 2014-04-07 DIAGNOSIS — K620 Anal polyp: Secondary | ICD-10-CM | POA: Diagnosis not present

## 2014-04-07 DIAGNOSIS — Z85828 Personal history of other malignant neoplasm of skin: Secondary | ICD-10-CM | POA: Diagnosis not present

## 2014-04-07 DIAGNOSIS — K648 Other hemorrhoids: Secondary | ICD-10-CM | POA: Diagnosis not present

## 2014-04-07 LAB — HM COLONOSCOPY

## 2014-04-08 LAB — PATHOLOGY REPORT

## 2014-04-13 ENCOUNTER — Other Ambulatory Visit (INDEPENDENT_AMBULATORY_CARE_PROVIDER_SITE_OTHER): Payer: Medicare Other

## 2014-04-13 DIAGNOSIS — R17 Unspecified jaundice: Secondary | ICD-10-CM

## 2014-04-14 ENCOUNTER — Encounter: Payer: Self-pay | Admitting: Adult Health

## 2014-04-14 LAB — BILIRUBIN,DIRECT & INDIRECT (FRACTIONATED)
Bilirubin, Direct: 0.2 mg/dL (ref 0.0–0.3)
Indirect Bilirubin: 0.9 mg/dL (ref 0.2–1.2)

## 2014-04-14 LAB — BILIRUBIN, TOTAL: Total Bilirubin: 1.1 mg/dL (ref 0.2–1.2)

## 2014-04-29 ENCOUNTER — Encounter: Payer: Self-pay | Admitting: *Deleted

## 2014-04-29 DIAGNOSIS — C44611 Basal cell carcinoma of skin of unspecified upper limb, including shoulder: Secondary | ICD-10-CM | POA: Diagnosis not present

## 2014-05-27 ENCOUNTER — Encounter: Payer: Self-pay | Admitting: Adult Health

## 2014-06-17 ENCOUNTER — Other Ambulatory Visit: Payer: Self-pay | Admitting: Adult Health

## 2014-06-24 ENCOUNTER — Other Ambulatory Visit: Payer: Self-pay | Admitting: Adult Health

## 2014-06-24 ENCOUNTER — Encounter: Payer: Self-pay | Admitting: Adult Health

## 2014-07-01 ENCOUNTER — Encounter: Payer: Self-pay | Admitting: Adult Health

## 2014-08-26 DIAGNOSIS — Z85828 Personal history of other malignant neoplasm of skin: Secondary | ICD-10-CM | POA: Diagnosis not present

## 2014-08-26 DIAGNOSIS — L570 Actinic keratosis: Secondary | ICD-10-CM | POA: Diagnosis not present

## 2014-08-26 DIAGNOSIS — Q828 Other specified congenital malformations of skin: Secondary | ICD-10-CM | POA: Diagnosis not present

## 2014-08-26 DIAGNOSIS — X32XXXA Exposure to sunlight, initial encounter: Secondary | ICD-10-CM | POA: Diagnosis not present

## 2014-08-26 DIAGNOSIS — L821 Other seborrheic keratosis: Secondary | ICD-10-CM | POA: Diagnosis not present

## 2014-08-26 DIAGNOSIS — D485 Neoplasm of uncertain behavior of skin: Secondary | ICD-10-CM | POA: Diagnosis not present

## 2014-09-03 ENCOUNTER — Other Ambulatory Visit: Payer: Self-pay | Admitting: *Deleted

## 2014-09-03 MED ORDER — ALENDRONATE SODIUM 70 MG PO TABS: ORAL_TABLET | ORAL | Status: DC

## 2014-09-23 ENCOUNTER — Ambulatory Visit: Payer: Self-pay | Admitting: Adult Health

## 2014-09-23 DIAGNOSIS — K449 Diaphragmatic hernia without obstruction or gangrene: Secondary | ICD-10-CM | POA: Diagnosis not present

## 2014-09-23 DIAGNOSIS — I714 Abdominal aortic aneurysm, without rupture: Secondary | ICD-10-CM | POA: Diagnosis not present

## 2014-09-23 DIAGNOSIS — Z5189 Encounter for other specified aftercare: Secondary | ICD-10-CM | POA: Diagnosis not present

## 2014-09-24 ENCOUNTER — Telehealth: Payer: Self-pay | Admitting: Internal Medicine

## 2014-09-24 DIAGNOSIS — I714 Abdominal aortic aneurysm, without rupture, unspecified: Secondary | ICD-10-CM

## 2014-09-24 NOTE — Telephone Encounter (Signed)
Annual CT to follow his aneurysms is unchanged .  Continue annual CTs

## 2014-09-24 NOTE — Assessment & Plan Note (Signed)
Unchanged by Nov 2015 CT   hiatal hernia  noted

## 2014-09-24 NOTE — Telephone Encounter (Signed)
Sent my chart with results. 

## 2014-10-13 ENCOUNTER — Encounter: Payer: Self-pay | Admitting: Adult Health

## 2014-11-02 ENCOUNTER — Ambulatory Visit (INDEPENDENT_AMBULATORY_CARE_PROVIDER_SITE_OTHER): Payer: Medicare Other | Admitting: Internal Medicine

## 2014-11-02 ENCOUNTER — Encounter: Payer: Self-pay | Admitting: Internal Medicine

## 2014-11-02 VITALS — BP 110/66 | HR 65 | Temp 98.5°F | Resp 14 | Wt 222.5 lb

## 2014-11-02 DIAGNOSIS — K219 Gastro-esophageal reflux disease without esophagitis: Secondary | ICD-10-CM

## 2014-11-02 DIAGNOSIS — E785 Hyperlipidemia, unspecified: Secondary | ICD-10-CM

## 2014-11-02 DIAGNOSIS — I714 Abdominal aortic aneurysm, without rupture, unspecified: Secondary | ICD-10-CM

## 2014-11-02 DIAGNOSIS — Z8619 Personal history of other infectious and parasitic diseases: Secondary | ICD-10-CM | POA: Diagnosis not present

## 2014-11-02 DIAGNOSIS — G629 Polyneuropathy, unspecified: Secondary | ICD-10-CM | POA: Diagnosis not present

## 2014-11-02 DIAGNOSIS — M8580 Other specified disorders of bone density and structure, unspecified site: Secondary | ICD-10-CM | POA: Diagnosis not present

## 2014-11-02 DIAGNOSIS — G609 Hereditary and idiopathic neuropathy, unspecified: Secondary | ICD-10-CM | POA: Diagnosis not present

## 2014-11-02 LAB — HEMOGLOBIN A1C: Hgb A1c MFr Bld: 6.1 % (ref 4.6–6.5)

## 2014-11-02 MED ORDER — FAMOTIDINE 40 MG PO TABS: 40.0000 mg | ORAL_TABLET | Freq: Every day | ORAL | Status: DC

## 2014-11-02 NOTE — Patient Instructions (Addendum)
We are trying famotidine instead of protonix for GERD .  rx sent to [facility],  One tablet daily,  or twice daily if needed for evening symptoms  I recommend getting the majority of your calcium and Vitamin D  through diet rather than supplements given the recent association of calcium supplements with increased coronary artery calcium scores (You need 1200 mg daily )   Unsweetened almond/coconut milk is a great low calorie low carb, cholesterol free  way to increase your dietary calcium and vitamin D.  Try the [employer]  brand

## 2014-11-02 NOTE — Progress Notes (Signed)
Patient ID: Jamie Zhang, male   DOB: [birth year], 73 y.o.   MRN: [id number]  Patient Active Problem List   Diagnosis Date Noted  . Osteopenia determined by x-ray 11/03/2014  . GERD (gastroesophageal reflux disease) 11/03/2014  . History of varicella infection 11/02/2014  . Abdominal aortic aneurysm 04/01/2014  . Peripheral neuropathy 04/01/2014  . Bursitis of hip 12/29/2013  . Encounter to establish care 10/13/2013  . HLD (hyperlipidemia) 10/13/2013    Subjective:  CC:   Chief Complaint  Patient presents with  . Follow-up    last seen for hypertriglyceridemia    HPI:   Jamie Zhang is a 73 y.o. male who presents for Follow up on several chronic issues including mild hypertriglyceridemia, managed with fish oil, chronic GERD,  Osteopenia, And peripheral neuropathy  Patient Active Problem List   Diagnosis Date Noted  . History of varicella infection 11/02/2014  . Abdominal aortic aneurysm 04/01/2014  . Peripheral neuropathy 04/01/2014  . Bursitis of hip 12/29/2013  . Encounter to establish care 10/13/2013  . HLD (hyperlipidemia) 10/13/2013   GERD: He has been taking protonix for years and is concerned about its effects on his osteopenia due to impaired calcium and vitmain D absorption requiring supplementation.   Osteopenia:  The osteopenia  was found during workup for shoulder pain by a physician he was referred to by his former PCP, at  the [facility].  Multiple vertebral compression fractures were apparently found and alendroante was prescribed. He has been taking it for over 2 years. Records are not available. Next DEXA due in 2016  Renal calculi: He has a history of passing a kidney stone .  He subsequently Reduced calcium capsules to 2 daily,    Wife has healthcare POA,  Followed by daughter    Past Medical History  Diagnosis Date  . Cataract 01/02/2011    Bilateral - s/p excision  . Skin cancer 01/04/2012    Basal cell nose, squamous cell scalp - Dr. Ula Lingo   . Iron deficiency anemia 03/20/2006    Donating too much blood  . Migraine     No longer has problems  . HLD (hyperlipidemia)   . Colon polyps 2000    Hyperplastic - Repeat colonoscopy 2015  . Schatzki's ring   . GERD (gastroesophageal reflux disease)     Pantoprazole  . Osteopenia     on Fosamax  . Hemorrhoids 2006  . RLS (restless legs syndrome) 03/20/2006    2/2 iron deficiency    Past Surgical History  Procedure Laterality Date  . Cataract surgery Bilateral   . Rotator cuff repair Left   . Breast biopsy Left     benign  . Inguinal hernia repair Left        The following portions of the patient's history were reviewed and updated as appropriate: Allergies, current medications, and problem list.    Review of Systems:   Patient denies headache, fevers, malaise, unintentional weight loss, skin rash, eye pain, sinus congestion and sinus pain, sore throat, dysphagia,  hemoptysis , cough, dyspnea, wheezing, chest pain, palpitations, orthopnea, edema, abdominal pain, nausea, melena, diarrhea, constipation, flank pain, dysuria, hematuria, urinary  Frequency, nocturia, numbness, tingling, seizures,  Focal weakness, Loss of consciousness,  Tremor, insomnia, depression, anxiety, and suicidal ideation.     History   Social History  . Marital Status: Married    Spouse Name: N/A    Number of Children: 2  . Years of Education: 12   Occupational History  . 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 Real Estate   . Manager for [employer]    Social History Main Topics  . Smoking status: Former Smoker -- 2.00 packs/day for 21 years    Types: Cigarettes  . Smokeless tobacco: Not on file  . Alcohol Use: No     Comment: Occasional wine glass  . Drug Use: No  . Sexual Activity: Not on file   Other Topics Concern  . Not on file   Social History Narrative   Mr. Bonsall grew up in [place], [place]. He joined the [employer] right out of [employer] and served for 2.5 years then was  active in [employer]. 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 He then worked in [employer] and afterward became a Freight forwarder for [employer]. He recently moved to [place] with his wife. They have two daughters that are still living in [place]. As a hobby he enjoys woodwork mainly with cabinet work.     Objective:  Filed Vitals:   11/02/14 1123  BP: 110/66  Pulse: 65  Temp: 98.5 F (36.9 C)  Resp: 14     General appearance: alert, cooperative and appears stated age Ears: normal TM's and external ear canals both ears Throat: lips, mucosa, and tongue normal; teeth and gums normal Neck: no adenopathy, no carotid bruit, supple, symmetrical, trachea midline and thyroid not enlarged, symmetric, no tenderness/mass/nodules Back: symmetric, no curvature. ROM normal. No CVA tenderness. Lungs: clear to auscultation bilaterally Heart: regular rate and rhythm, S1, S2 normal, no murmur, click, rub or gallop Abdomen: soft, non-tender; bowel sounds normal; no masses,  no organomegaly Pulses: 2+ and symmetric Skin: Skin color, texture, turgor normal. No rashes or lesions Lymph nodes: Cervical, supraclavicular, and axillary nodes normal.  Assessment and Plan:  Abdominal aortic aneurysm He has had no change in the size of his AAA by follow up CT .  He has no history of hypertension or tobacco abuse. Calcifications discussed,  He does not want to use statins at this time.   Lab Results  Component Value Date   CHOL 202* 04/03/2014   HDL 47.20 04/03/2014   LDLCALC 127* 04/03/2014   TRIG 139.0 04/03/2014   CHOLHDL 4 04/03/2014     Peripheral neuropathy stable for the past year  Prior workup was B12 level only, checking thyroid, A1c and ruling out syphilis  No results found for: RPR Lab Results  Component Value Date   TSH 3.290 11/02/2014   Lab Results  Component Value Date   HGBA1C 6.1 11/02/2014   .  Osteopenia  determined by x-ray Records requested, patient is taking alendronate, prescribed by another physician prior to move to [facility], since 2014. Discussed the current controversies surrounding the risks and benefits of calcium supplementation.  Encouraged him to increase dietary calcium through natural foods including almond/coconut milk  GERD (gastroesophageal reflux disease) Offered trial of famotidine 40 mg daily and suspension of PPI Given his concern about impact on bone healh.   HLD (hyperlipidemia) LDL and triglycerides are at goal on current medications. He has no side effects and liver enzymes are normal. No changes today.  Lab Results  Component Value Date   CHOL 202* 04/03/2014   HDL 47.20 04/03/2014   LDLCALC 127* 04/03/2014   TRIG 139.0 04/03/2014   CHOLHDL 4 04/03/2014      A total of 40 minutes was spent with patient more than half of which  was spent in counseling patient on the above mentioned issues , reviewing and explaining recent labs and imaging studies done, and coordination of care.   Updated Medication List Outpatient Encounter Prescriptions as of 11/02/2014  Medication Sig  . alendronate (FOSAMAX) 70 MG tablet TAKE 1 TABLET ONE TIME A WEEK. TAKE WITH A FULL GLASS OF WATER ON AN EMPTY STOMACH.  . Calcium Citrate-Vitamin D (CITRACAL + D PO) Take 650 mg by mouth daily. 2 tablets daily  . cholecalciferol (VITAMIN D) 400 UNITS TABS tablet Take 1,000 Units by mouth daily.   . Flax Oil-Fish Oil-Borage Oil (CVS OMEGA-3) CAPS Take per instructions  . fluticasone (FLONASE) 50 MCG/ACT nasal spray USE 2 SPRAYS IN EACH NOSTRIL EVERY DAY  . pantoprazole (PROTONIX) 40 MG tablet Take 1 tablet (40 mg total) by mouth daily.  . famotidine (PEPCID) 40 MG tablet Take 1 tablet (40 mg total) by mouth daily.     Orders Placed This Encounter  Procedures  . T4 AND TSH  . RPR  . Hemoglobin A1c    Return in about 6 months (around 05/04/2015).

## 2014-11-02 NOTE — Assessment & Plan Note (Addendum)
stable for the past year  Prior workup was B12 level only, checking thyroid, A1c and ruling out syphilis  No results found for: RPR Lab Results  Component Value Date   TSH 3.290 11/02/2014   Lab Results  Component Value Date   HGBA1C 6.1 11/02/2014   .

## 2014-11-02 NOTE — Assessment & Plan Note (Addendum)
He has had no change in the size of his AAA by follow up CT .  He has no history of hypertension or tobacco abuse. Calcifications discussed,  He does not want to use statins at this time.   Lab Results  Component Value Date   CHOL 202* 04/03/2014   HDL 47.20 04/03/2014   LDLCALC 127* 04/03/2014   TRIG 139.0 04/03/2014   CHOLHDL 4 04/03/2014

## 2014-11-02 NOTE — Progress Notes (Signed)
Pre visit review using our clinic review tool, if applicable. No additional management support is needed unless otherwise documented below in the visit note. 

## 2014-11-03 DIAGNOSIS — M810 Age-related osteoporosis without current pathological fracture: Secondary | ICD-10-CM | POA: Insufficient documentation

## 2014-11-03 DIAGNOSIS — M85859 Other specified disorders of bone density and structure, unspecified thigh: Secondary | ICD-10-CM | POA: Insufficient documentation

## 2014-11-03 DIAGNOSIS — K219 Gastro-esophageal reflux disease without esophagitis: Secondary | ICD-10-CM | POA: Insufficient documentation

## 2014-11-03 LAB — T4 AND TSH
T4 TOTAL: 7.7 ug/dL (ref 4.5–12.0)
TSH: 3.29 u[IU]/mL (ref 0.450–4.500)

## 2014-11-03 LAB — RPR

## 2014-11-03 NOTE — Assessment & Plan Note (Signed)
LDL and triglycerides are at goal on current medications. He has no side effects and liver enzymes are normal. No changes today.  Lab Results  Component Value Date   CHOL 202* 04/03/2014   HDL 47.20 04/03/2014   LDLCALC 127* 04/03/2014   TRIG 139.0 04/03/2014   CHOLHDL 4 04/03/2014

## 2014-11-03 NOTE — Assessment & Plan Note (Signed)
Offered trial of famotidine 40 mg daily and suspension of PPI Given his concern about impact on bone healh.

## 2014-11-03 NOTE — Assessment & Plan Note (Signed)
Records requested, patient is taking alendronate, prescribed by another physician prior to move to [facility], since 2014. Discussed the current controversies surrounding the risks and benefits of calcium supplementation.  Encouraged him to increase dietary calcium through natural foods including almond/coconut milk

## 2014-11-05 ENCOUNTER — Encounter: Payer: Self-pay | Admitting: Internal Medicine

## 2014-11-07 ENCOUNTER — Encounter: Payer: Self-pay | Admitting: Internal Medicine

## 2014-11-07 DIAGNOSIS — E669 Obesity, unspecified: Secondary | ICD-10-CM

## 2014-11-07 DIAGNOSIS — R7303 Prediabetes: Secondary | ICD-10-CM

## 2014-11-07 DIAGNOSIS — R7301 Impaired fasting glucose: Secondary | ICD-10-CM

## 2014-11-09 NOTE — Telephone Encounter (Signed)
Referral is in process as requested to the [facility] nutritional counsellor,.

## 2014-11-16 DIAGNOSIS — L821 Other seborrheic keratosis: Secondary | ICD-10-CM | POA: Diagnosis not present

## 2014-11-16 DIAGNOSIS — X32XXXA Exposure to sunlight, initial encounter: Secondary | ICD-10-CM | POA: Diagnosis not present

## 2014-11-16 DIAGNOSIS — Z85828 Personal history of other malignant neoplasm of skin: Secondary | ICD-10-CM | POA: Diagnosis not present

## 2014-11-16 DIAGNOSIS — L570 Actinic keratosis: Secondary | ICD-10-CM | POA: Diagnosis not present

## 2014-11-19 ENCOUNTER — Ambulatory Visit: Payer: Medicare Other | Admitting: Dietician

## 2014-12-23 DIAGNOSIS — Z961 Presence of intraocular lens: Secondary | ICD-10-CM | POA: Diagnosis not present

## 2015-05-04 ENCOUNTER — Ambulatory Visit (INDEPENDENT_AMBULATORY_CARE_PROVIDER_SITE_OTHER): Payer: Medicare Other | Admitting: Internal Medicine

## 2015-05-04 ENCOUNTER — Encounter: Payer: Self-pay | Admitting: Internal Medicine

## 2015-05-04 VITALS — BP 104/60 | HR 66 | Temp 98.1°F | Resp 12 | Ht 70.0 in | Wt 196.0 lb

## 2015-05-04 DIAGNOSIS — M810 Age-related osteoporosis without current pathological fracture: Secondary | ICD-10-CM | POA: Diagnosis not present

## 2015-05-04 DIAGNOSIS — M8580 Other specified disorders of bone density and structure, unspecified site: Secondary | ICD-10-CM | POA: Diagnosis not present

## 2015-05-04 DIAGNOSIS — Z23 Encounter for immunization: Secondary | ICD-10-CM

## 2015-05-04 DIAGNOSIS — R739 Hyperglycemia, unspecified: Secondary | ICD-10-CM

## 2015-05-04 DIAGNOSIS — E663 Overweight: Secondary | ICD-10-CM

## 2015-05-04 DIAGNOSIS — E785 Hyperlipidemia, unspecified: Secondary | ICD-10-CM

## 2015-05-04 DIAGNOSIS — R7301 Impaired fasting glucose: Secondary | ICD-10-CM

## 2015-05-04 MED ORDER — FLUTICASONE PROPIONATE 50 MCG/ACT NA SUSP: 2.0000 | Freq: Every day | NASAL | Status: DC

## 2015-05-04 NOTE — Progress Notes (Signed)
Pre-visit discussion using our clinic review tool. No additional management support is needed unless otherwise documented below in the visit note.  

## 2015-05-04 NOTE — Assessment & Plan Note (Signed)
Has been taking alendronate for  Four years.  Last DEXA was 3 years.

## 2015-05-04 NOTE — Progress Notes (Signed)
Subjective:  Patient ID: Jamie Zhang, male    DOB: [birth year]  Age: 74 y.o. MRN: [id number]  CC: The primary encounter diagnosis was Osteoporosis. Diagnoses of Osteopenia determined by x-ray, Hyperglycemia, Need for prophylactic vaccination against Streptococcus pneumoniae (pneumococcus), Impaired fasting glucose, Overweight (BMI 25.0-29.9), and HLD (hyperlipidemia) were also pertinent to this visit.  HPI Jamie Zhang presents for follow up impaired fasting glucose, elevated A1c. Since his last visit in December he has lost 26 lbs and has been following a low GI diet and exercising regularly .  He feels great, is fasting , and has no new isues.   Outpatient Prescriptions Prior to Visit  Medication Sig Dispense Refill  . alendronate (FOSAMAX) 70 MG tablet TAKE 1 TABLET ONE TIME A WEEK. TAKE WITH A FULL GLASS OF WATER ON AN EMPTY STOMACH. 12 tablet 1  . Calcium Citrate-Vitamin D (CITRACAL + D PO) Take 650 mg by mouth daily. 2 tablets daily    . Flax Oil-Fish Oil-Borage Oil ([facility] OMEGA-3) CAPS Take per instructions 30 capsule 6  . fluticasone (FLONASE) 50 MCG/ACT nasal spray USE 2 SPRAYS IN EACH NOSTRIL EVERY DAY 48 g 1  . cholecalciferol (VITAMIN D) 400 UNITS TABS tablet Take 1,000 Units by mouth daily.     . famotidine (PEPCID) 40 MG tablet Take 1 tablet (40 mg total) by mouth daily. (Patient not taking: Reported on 05/04/2015) 90 tablet 1  . pantoprazole (PROTONIX) 40 MG tablet Take 1 tablet (40 mg total) by mouth daily. (Patient not taking: Reported on 05/04/2015) 90 tablet 1   No facility-administered medications prior to visit.    Review of Systems;  Patient denies headache, fevers, malaise, unintentional weight loss, skin rash, eye pain, sinus congestion and sinus pain, sore throat, dysphagia,  hemoptysis , cough, dyspnea, wheezing, chest pain, palpitations, orthopnea, edema, abdominal pain, nausea, melena, diarrhea, constipation, flank pain, dysuria, hematuria, urinary  Frequency,  nocturia, numbness, tingling, seizures,  Focal weakness, Loss of consciousness,  Tremor, insomnia, depression, anxiety, and suicidal ideation.      Objective:  BP 104/60 mmHg  Pulse 66  Temp(Src) 98.1 F (36.7 C) (Oral)  Resp 12  Ht 5\' 10"  (1.778 m)  Wt 196 lb (88.905 kg)  BMI 28.12 kg/m2  SpO2 98%  BP Readings from Last 3 Encounters:  05/04/15 104/60  11/02/14 110/66  04/01/14 116/68    Wt Readings from Last 3 Encounters:  05/04/15 196 lb (88.905 kg)  11/02/14 222 lb 8 oz (100.925 kg)  04/01/14 223 lb 12 oz (101.492 kg)    General appearance: alert, cooperative and appears stated age Ears: normal TM's and external ear canals both ears Throat: lips, mucosa, and tongue normal; teeth and gums normal Neck: no adenopathy, no carotid bruit, supple, symmetrical, trachea midline and thyroid not enlarged, symmetric, no tenderness/mass/nodules Back: symmetric, no curvature. ROM normal. No CVA tenderness. Lungs: clear to auscultation bilaterally Heart: regular rate and rhythm, S1, S2 normal, no murmur, click, rub or gallop Abdomen: soft, non-tender; bowel sounds normal; no masses,  no organomegaly Pulses: 2+ and symmetric Skin: Skin color, texture, turgor normal. No rashes or lesions Lymph nodes: Cervical, supraclavicular, and axillary nodes normal.  Lab Results  Component Value Date   HGBA1C 5.6 05/05/2015   HGBA1C 6.1 11/02/2014    Lab Results  Component Value Date   CREATININE 1.05 05/05/2015   CREATININE 1.1 04/03/2014   CREATININE 1.15 03/13/2014    Lab Results  Component Value Date   WBC 8.4 04/03/2014  HGB 15.6 04/03/2014   HCT 46.2 04/03/2014   PLT 199.0 04/03/2014   GLUCOSE 91 05/05/2015   CHOL 165 05/05/2015   TRIG 91.0 05/05/2015   HDL 50.90 05/05/2015   LDLDIRECT 99.0 05/05/2015   LDLCALC 96 05/05/2015   ALT 16 05/05/2015   AST 18 05/05/2015   NA 139 05/05/2015   K 4.0 05/05/2015   CL 106 05/05/2015   CREATININE 1.05 05/05/2015   BUN 21  05/05/2015   CO2 27 05/05/2015   TSH 3.290 11/02/2014   HGBA1C 5.6 05/05/2015    No results found.  Assessment & Plan:   Problem List Items Addressed This Visit    HLD (hyperlipidemia)    LDL and triglycerides are at goal on current medications. He has no side effects and liver enzymes are normal. No changes today.  Lab Results  Component Value Date   CHOL 165 05/05/2015   HDL 50.90 05/05/2015   LDLCALC 96 05/05/2015   LDLDIRECT 99.0 05/05/2015   TRIG 91.0 05/05/2015   CHOLHDL 3 05/05/2015            Impaired fasting glucose    He has lowered his a1c to a normal range by losing weight and reducing the sugar in his diet.       Overweight (BMI 25.0-29.9)   Osteopenia determined by x-ray    Has been taking alendronate for  Four years.  Last DEXA was 3 years.       Relevant Orders   DG Bone Density    Other Visit Diagnoses    Osteoporosis    -  Primary    Hyperglycemia        Relevant Orders    Comprehensive metabolic panel (Completed)    Hemoglobin A1c (Completed)    LDL cholesterol, direct (Completed)    Lipid panel (Completed)    Need for prophylactic vaccination against Streptococcus pneumoniae (pneumococcus)        Relevant Orders    Pneumococcal conjugate vaccine 13-valent (Completed)     A total of 25 minutes was spent with patient more than half of which was spent in counseling patient on the above mentioned issues , reviewing and explaining recent labs and imaging studies done, and coordination of care.   I have discontinued Mr. Steely cholecalciferol, pantoprazole, and famotidine. I have also changed his fluticasone. Additionally, I am having him maintain his [facility] OMEGA-3, Calcium Citrate-Vitamin D (CITRACAL + D PO), and alendronate.  Meds ordered this encounter  Medications  . fluticasone (FLONASE) 50 MCG/ACT nasal spray    Sig: Place 2 sprays into both nostrils daily.    Dispense:  48 g    Refill:  2    Medications Discontinued During This  Encounter  Medication Reason  . pantoprazole (PROTONIX) 40 MG tablet Patient Preference  . famotidine (PEPCID) 40 MG tablet Patient Preference  . cholecalciferol (VITAMIN D) 400 UNITS TABS tablet Patient Preference  . fluticasone (FLONASE) 50 MCG/ACT nasal spray Reorder    Follow-up: No Follow-up on file.   Crecencio Mc, MD

## 2015-05-04 NOTE — Patient Instructions (Signed)
You have done extremely well!  Return for fasting labs   See you in 6 months

## 2015-05-05 ENCOUNTER — Other Ambulatory Visit (INDEPENDENT_AMBULATORY_CARE_PROVIDER_SITE_OTHER): Payer: Medicare Other

## 2015-05-05 ENCOUNTER — Encounter: Payer: Self-pay | Admitting: Internal Medicine

## 2015-05-05 DIAGNOSIS — R739 Hyperglycemia, unspecified: Secondary | ICD-10-CM

## 2015-05-05 DIAGNOSIS — E663 Overweight: Secondary | ICD-10-CM | POA: Insufficient documentation

## 2015-05-05 DIAGNOSIS — E785 Hyperlipidemia, unspecified: Secondary | ICD-10-CM | POA: Diagnosis not present

## 2015-05-05 DIAGNOSIS — R7303 Prediabetes: Secondary | ICD-10-CM | POA: Insufficient documentation

## 2015-05-05 LAB — LIPID PANEL
CHOLESTEROL: 165 mg/dL (ref 0–200)
HDL: 50.9 mg/dL (ref >39.00)
LDL CALC: 96 mg/dL (ref 0–99)
NonHDL: 114.1
TRIGLYCERIDES: 91 mg/dL (ref 0.0–149.0)
Total CHOL/HDL Ratio: 3
VLDL: 18.2 mg/dL (ref 0.0–40.0)

## 2015-05-05 LAB — COMPREHENSIVE METABOLIC PANEL
ALBUMIN: 4.2 g/dL (ref 3.5–5.2)
ALT: 16 U/L (ref 0–53)
AST: 18 U/L (ref 0–37)
Alkaline Phosphatase: 40 U/L (ref 39–117)
BILIRUBIN TOTAL: 2 mg/dL — AB (ref 0.2–1.2)
BUN: 21 mg/dL (ref 6–23)
CO2: 27 mEq/L (ref 19–32)
Calcium: 9.3 mg/dL (ref 8.4–10.5)
Chloride: 106 mEq/L (ref 96–112)
Creatinine, Ser: 1.05 mg/dL (ref 0.40–1.50)
GFR: 73.17 mL/min (ref >60.00)
GLUCOSE: 91 mg/dL (ref 70–99)
POTASSIUM: 4 meq/L (ref 3.5–5.1)
Sodium: 139 mEq/L (ref 135–145)
Total Protein: 6.7 g/dL (ref 6.0–8.3)

## 2015-05-05 LAB — HEMOGLOBIN A1C: Hgb A1c MFr Bld: 5.6 % (ref 4.6–6.5)

## 2015-05-05 LAB — LDL CHOLESTEROL, DIRECT: LDL DIRECT: 99 mg/dL

## 2015-05-05 NOTE — Assessment & Plan Note (Signed)
LDL and triglycerides are at goal on current medications. He has no side effects and liver enzymes are normal. No changes today.  Lab Results  Component Value Date   CHOL 165 05/05/2015   HDL 50.90 05/05/2015   LDLCALC 96 05/05/2015   LDLDIRECT 99.0 05/05/2015   TRIG 91.0 05/05/2015   CHOLHDL 3 05/05/2015

## 2015-05-05 NOTE — Assessment & Plan Note (Signed)
He has lowered his a1c to a normal range by losing weight and reducing the sugar in his diet.

## 2015-06-16 ENCOUNTER — Ambulatory Visit
Admission: RE | Admit: 2015-06-16 | Discharge: 2015-06-16 | Disposition: A | Discharge status: Home / Self Care | Payer: Medicare Other | Source: Ambulatory Visit | Attending: Internal Medicine | Admitting: Internal Medicine

## 2015-06-16 DIAGNOSIS — M85852 Other specified disorders of bone density and structure, left thigh: Secondary | ICD-10-CM | POA: Diagnosis not present

## 2015-06-16 DIAGNOSIS — M8580 Other specified disorders of bone density and structure, unspecified site: Secondary | ICD-10-CM | POA: Diagnosis not present

## 2015-06-20 ENCOUNTER — Encounter: Payer: Self-pay | Admitting: Internal Medicine

## 2015-06-21 ENCOUNTER — Encounter: Payer: Self-pay | Admitting: Internal Medicine

## 2015-08-03 DIAGNOSIS — D485 Neoplasm of uncertain behavior of skin: Secondary | ICD-10-CM | POA: Diagnosis not present

## 2015-08-03 DIAGNOSIS — D225 Melanocytic nevi of trunk: Secondary | ICD-10-CM | POA: Diagnosis not present

## 2015-08-03 DIAGNOSIS — D0461 Carcinoma in situ of skin of right upper limb, including shoulder: Secondary | ICD-10-CM | POA: Diagnosis not present

## 2015-08-03 DIAGNOSIS — X32XXXA Exposure to sunlight, initial encounter: Secondary | ICD-10-CM | POA: Diagnosis not present

## 2015-08-03 DIAGNOSIS — Z85828 Personal history of other malignant neoplasm of skin: Secondary | ICD-10-CM | POA: Diagnosis not present

## 2015-08-03 DIAGNOSIS — D2272 Melanocytic nevi of left lower limb, including hip: Secondary | ICD-10-CM | POA: Diagnosis not present

## 2015-08-03 DIAGNOSIS — D2262 Melanocytic nevi of left upper limb, including shoulder: Secondary | ICD-10-CM | POA: Diagnosis not present

## 2015-08-03 DIAGNOSIS — L570 Actinic keratosis: Secondary | ICD-10-CM | POA: Diagnosis not present

## 2015-08-07 ENCOUNTER — Encounter: Payer: Self-pay | Admitting: Internal Medicine

## 2015-08-09 ENCOUNTER — Other Ambulatory Visit: Payer: Self-pay

## 2015-08-09 MED ORDER — ALENDRONATE SODIUM 70 MG PO TABS: ORAL_TABLET | ORAL | Status: DC

## 2015-08-20 DIAGNOSIS — Z23 Encounter for immunization: Secondary | ICD-10-CM | POA: Diagnosis not present

## 2015-08-30 DIAGNOSIS — L905 Scar conditions and fibrosis of skin: Secondary | ICD-10-CM | POA: Diagnosis not present

## 2015-08-30 DIAGNOSIS — D0461 Carcinoma in situ of skin of right upper limb, including shoulder: Secondary | ICD-10-CM | POA: Diagnosis not present

## 2015-09-11 ENCOUNTER — Other Ambulatory Visit: Payer: Self-pay | Admitting: Internal Medicine

## 2015-11-03 ENCOUNTER — Encounter: Payer: Self-pay | Admitting: Internal Medicine

## 2015-11-03 ENCOUNTER — Ambulatory Visit (INDEPENDENT_AMBULATORY_CARE_PROVIDER_SITE_OTHER): Payer: Medicare Other | Admitting: Internal Medicine

## 2015-11-03 VITALS — BP 112/70 | HR 60 | Temp 97.8°F | Resp 12 | Ht 70.0 in | Wt 198.5 lb

## 2015-11-03 DIAGNOSIS — R419 Unspecified symptoms and signs involving cognitive functions and awareness: Secondary | ICD-10-CM

## 2015-11-03 DIAGNOSIS — Z125 Encounter for screening for malignant neoplasm of prostate: Secondary | ICD-10-CM

## 2015-11-03 DIAGNOSIS — E559 Vitamin D deficiency, unspecified: Secondary | ICD-10-CM | POA: Diagnosis not present

## 2015-11-03 DIAGNOSIS — E785 Hyperlipidemia, unspecified: Secondary | ICD-10-CM | POA: Diagnosis not present

## 2015-11-03 DIAGNOSIS — Z0000 Encounter for general adult medical examination without abnormal findings: Secondary | ICD-10-CM

## 2015-11-03 DIAGNOSIS — R5383 Other fatigue: Secondary | ICD-10-CM

## 2015-11-03 DIAGNOSIS — R7301 Impaired fasting glucose: Secondary | ICD-10-CM | POA: Diagnosis not present

## 2015-11-03 DIAGNOSIS — R4189 Other symptoms and signs involving cognitive functions and awareness: Secondary | ICD-10-CM | POA: Diagnosis not present

## 2015-11-03 DIAGNOSIS — G6289 Other specified polyneuropathies: Secondary | ICD-10-CM | POA: Diagnosis not present

## 2015-11-03 DIAGNOSIS — I714 Abdominal aortic aneurysm, without rupture, unspecified: Secondary | ICD-10-CM

## 2015-11-03 DIAGNOSIS — K5909 Other constipation: Secondary | ICD-10-CM

## 2015-11-03 DIAGNOSIS — Z1159 Encounter for screening for other viral diseases: Secondary | ICD-10-CM

## 2015-11-03 LAB — COMPREHENSIVE METABOLIC PANEL
ALT: 14 U/L (ref 0–53)
AST: 15 U/L (ref 0–37)
Albumin: 4.3 g/dL (ref 3.5–5.2)
Alkaline Phosphatase: 48 U/L (ref 39–117)
BILIRUBIN TOTAL: 1.6 mg/dL — AB (ref 0.2–1.2)
BUN: 27 mg/dL — AB (ref 6–23)
CO2: 28 meq/L (ref 19–32)
Calcium: 9.6 mg/dL (ref 8.4–10.5)
Chloride: 106 mEq/L (ref 96–112)
Creatinine, Ser: 0.95 mg/dL (ref 0.40–1.50)
GFR: 82.02 mL/min (ref >60.00)
GLUCOSE: 90 mg/dL (ref 70–99)
Potassium: 4.5 mEq/L (ref 3.5–5.1)
SODIUM: 142 meq/L (ref 135–145)
TOTAL PROTEIN: 6.8 g/dL (ref 6.0–8.3)

## 2015-11-03 LAB — LIPID PANEL
CHOL/HDL RATIO: 3
Cholesterol: 187 mg/dL (ref 0–200)
HDL: 54.4 mg/dL (ref >39.00)
LDL CALC: 118 mg/dL — AB (ref 0–99)
NONHDL: 132.3
TRIGLYCERIDES: 71 mg/dL (ref 0.0–149.0)
VLDL: 14.2 mg/dL (ref 0.0–40.0)

## 2015-11-03 LAB — CBC WITH DIFFERENTIAL/PLATELET
BASOS ABS: 0 10*3/uL (ref 0.0–0.1)
Basophils Relative: 0.5 % (ref 0.0–3.0)
EOS ABS: 0.1 10*3/uL (ref 0.0–0.7)
Eosinophils Relative: 1.5 % (ref 0.0–5.0)
HEMATOCRIT: 47 % (ref 39.0–52.0)
HEMOGLOBIN: 15.6 g/dL (ref 13.0–17.0)
LYMPHS PCT: 18.5 % (ref 12.0–46.0)
Lymphs Abs: 1.2 10*3/uL (ref 0.7–4.0)
MCHC: 33.1 g/dL (ref 30.0–36.0)
MCV: 88.7 fl (ref 78.0–100.0)
MONOS PCT: 8.5 % (ref 3.0–12.0)
Monocytes Absolute: 0.6 10*3/uL (ref 0.1–1.0)
NEUTROS ABS: 4.6 10*3/uL (ref 1.4–7.7)
Neutrophils Relative %: 71 % (ref 43.0–77.0)
PLATELETS: 196 10*3/uL (ref 150.0–400.0)
RBC: 5.3 Mil/uL (ref 4.22–5.81)
RDW: 14.4 % (ref 11.5–15.5)
WBC: 6.5 10*3/uL (ref 4.0–10.5)

## 2015-11-03 LAB — TSH: TSH: 1.54 u[IU]/mL (ref 0.35–4.50)

## 2015-11-03 LAB — VITAMIN D 25 HYDROXY (VIT D DEFICIENCY, FRACTURES): VITD: 31.72 ng/mL (ref 30.00–100.00)

## 2015-11-03 LAB — MAGNESIUM: MAGNESIUM: 2.3 mg/dL (ref 1.5–2.5)

## 2015-11-03 LAB — PSA, MEDICARE: PSA: 0.86 ng/ml (ref 0.10–4.00)

## 2015-11-03 LAB — VITAMIN B12: Vitamin B-12: 481 pg/mL (ref 211–911)

## 2015-11-03 LAB — LDL CHOLESTEROL, DIRECT: LDL DIRECT: 121 mg/dL

## 2015-11-03 LAB — HEMOGLOBIN A1C: Hgb A1c MFr Bld: 5.6 % (ref 4.6–6.5)

## 2015-11-03 NOTE — Assessment & Plan Note (Addendum)
Symptoms started after surgery for Morton's Nueromas 25 yrs ago  Not recent progression  Foot exam notable for loss of sensation on palmar surface of both feet,

## 2015-11-03 NOTE — Patient Instructions (Signed)

## 2015-11-03 NOTE — Progress Notes (Signed)
Pre-visit discussion using our clinic review tool. No additional management support is needed unless otherwise documented below in the visit note.  

## 2015-11-03 NOTE — Progress Notes (Signed)
Patient ID: RAESHON GANTT, male    DOB: Oct 18, 1941  Age: 74 y.o. MRN: [id number]  The patient is here for annual Medicare wellness examination and management of other chronic and acute problems.    Last colonoscopy in 2015: tubular adenomas  PSA DUE  The risk factors are reflected in the social history.  The roster of all physicians providing medical care to patient - is listed in the Snapshot section of the chart.  Activities of daily living:  The patient is 100% independent in all ADLs: dressing, toileting, feeding as well as independent mobility  Home safety : The patient has smoke detectors in the home. They wear seatbelts.  There are no firearms at home. There is no violence in the home.   There is no risks for hepatitis, STDs or HIV. There is no   history of blood transfusion. They have no travel history to infectious disease endemic areas of the world.  The patient has seen their dentist in the last six month. They have seen their eye doctor in the last year. They admit to slight hearing difficulty with regard to whispered voices and some television programs.  They have deferred audiologic testing in the last year.  They do not  have excessive sun exposure. Discussed the need for sun protection: hats, long sleeves and use of sunscreen if there is significant sun exposure.   Diet: the importance of a healthy diet is discussed. They do have a healthy diet.  The benefits of regular aerobic exercise were discussed. She walks 4 times per week ,  20 minutes.   Depression screen: there are no signs or vegative symptoms of depression- irritability, change in appetite, anhedonia, sadness/tearfullness.  Cognitive assessment: the patient manages all their financial and personal affairs and is actively engaged. They could relate day,date,year and events; recalled 2/3 objects at 3 minutes; performed clock-face test normally.  The following portions of the patient's history were reviewed and updated  as appropriate: allergies, current medications, past family history, past medical history,  past surgical history, past social history  and problem list.  Visual acuity was not assessed per patient preference since she has regular follow up with her ophthalmologist. Hearing and body mass index were assessed and reviewed.   During the course of the visit the patient was educated and counseled about appropriate screening and preventive services including : fall prevention , diabetes screening, nutrition counseling, colorectal cancer screening, and recommended immunizations.    CC: The primary encounter diagnosis was Prostate cancer screening. 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     History Trevonne has a past medical history of Cataract (01/02/2011); Skin cancer (01/04/2012); Iron deficiency anemia (03/20/2006); Migraine; HLD (hyperlipidemia); Colon polyps (2000); Schatzki's ring; GERD (gastroesophageal reflux disease); Osteopenia; Hemorrhoids (2006); and RLS (restless legs syndrome) (03/20/2006).   He has past surgical history that includes Cataract Surgery (Bilateral); Rotator cuff repair (Left); Breast biopsy (Left); and Inguinal hernia repair (Left).   His family history includes Alcohol abuse in his father and mother; Cancer (age of onset: 29) in his sister; Cancer (age of onset: 49) in his father.He reports that he has quit smoking. His smoking use included Cigarettes. He has a 42 pack-year smoking history. He does not have any smokeless tobacco history on  file. He reports that he does not drink alcohol or use illicit drugs.  Outpatient Prescriptions Prior to Visit  Medication Sig Dispense  Refill  . alendronate (FOSAMAX) 70 MG tablet TAKE 1 TABLET ONE TIME A WEEK. TAKE WITH A FULL GLASS OF WATER ON AN EMPTY STOMACH. 12 tablet 5  . Calcium Citrate-Vitamin D (CITRACAL + D PO) Take 650 mg by mouth daily. 2 tablets daily    . famotidine (PEPCID) 40 MG tablet TAKE 1 TABLET EVERY DAY 90 tablet 1  . Flax Oil-Fish Oil-Borage Oil ([employer] OMEGA-3) CAPS Take per instructions 30 capsule 6  . fluticasone (FLONASE) 50 MCG/ACT nasal spray Place 2 sprays into both nostrils daily. 48 g 2   No facility-administered medications prior to visit.    Review of Systems   Patient denies headache, fevers, malaise, unintentional weight loss, skin rash, eye pain, sinus congestion and sinus pain, sore throat, dysphagia,  hemoptysis , cough, dyspnea, wheezing, chest pain, palpitations, orthopnea, edema, abdominal pain, nausea, melena, diarrhea, constipation, flank pain, dysuria, hematuria, urinary  Frequency, nocturia, numbness, tingling, seizures,  Focal weakness, Loss of consciousness,  Tremor, insomnia, depression, anxiety, and suicidal ideation.      Objective:  BP 112/70 mmHg  Pulse 60  Temp(Src) 97.8 F (36.6 C) (Oral)  Resp 12  Ht 5\' 10"  (1.778 m)  Wt 198 lb 8 oz (90.039 kg)  BMI 28.48 kg/m2  SpO2 98%  Physical Exam   .General appearance: alert, cooperative and appears stated age Ears: normal TM's and external ear canals both ears Throat: lips, mucosa, and tongue normal; teeth and gums normal Neck: no adenopathy, no carotid bruit, supple, symmetrical, trachea midline and thyroid not enlarged, symmetric, no tenderness/mass/nodules Back: symmetric, no curvature. ROM normal. No CVA tenderness. Lungs: clear to auscultation bilaterally Heart: regular rate and rhythm, S1, S2 normal, no murmur, click, rub or gallop Abdomen: soft, non-tender; bowel sounds normal; no masses,  no organomegaly Pulses: 2+ and symmetric Skin: Skin color, texture, turgor normal. No rashes or lesions Lymph nodes:  Cervical, supraclavicular, and axillary nodes normal.   Assessment & Plan:   Problem List Items Addressed This Visit    HLD (hyperlipidemia)    LDL and triglycerides are at goal without  medications.  Lab Results  Component Value Date   CHOL 187 11/03/2015   HDL 54.40 11/03/2015   LDLCALC 118* 11/03/2015   LDLDIRECT 121.0 11/03/2015   TRIG 71.0 11/03/2015   CHOLHDL 3 11/03/2015              Abdominal aortic aneurysm [facility])    He is due for annual surveillance.       Relevant Orders   CT Abdomen Pelvis Wo Contrast   Peripheral neuropathy    Symptoms started after surgery for Morton's Nueromas 25 yrs ago  Not recent progression  Foot exam notable for loss of sensation on palmar surface of both feet,       Impaired fasting glucose    He has lowered his a1c to a normal range by losing weight and reducing the sugar in his diet.   Lab Results  Component Value Date   HGBA1C 5.6 11/03/2015         Relevant Orders   Hemoglobin A1c (Completed)   Encounter for preventive health examination    Annual comprehensive preventive exam was done as well as an evaluation and management of chronic conditions .  During the course of the visit the patient was educated and counseled about appropriate screening and  preventive services including :  diabetes screening, lipid analysis with projected  10 year  risk for CAD,  nutrition counseling, colorectal cancer screening, and recommended immunizations.  Printed recommendations for health maintenance screenings was given.   Lab Results  Component Value Date   PSA 0.86 11/03/2015          Other Visit Diagnoses    Prostate cancer screening    -  Primary    Relevant Orders    PSA, Medicare (Completed)    Need for hepatitis C screening test        Relevant Orders    Hepatitis C antibody (Completed)    Vitamin D deficiency        Relevant Orders    VITAMIN D 25 Hydroxy (Vit-D Deficiency, Fractures) (Completed)    Hyperlipidemia         Relevant Orders    LDL cholesterol, direct (Completed)    Lipid panel (Completed)    Cognitive complaints        Relevant Orders    TSH (Completed)    B12 (Completed)    RPR (Completed)    Other constipation        Relevant Orders    Comprehensive metabolic panel (Completed)    Magnesium (Completed)    Other fatigue        Relevant Orders    CBC with Differential/Platelet (Completed)       I am having Mr. Spragg maintain his [employer] OMEGA-3, Calcium Citrate-Vitamin D (CITRACAL + D PO), fluticasone, alendronate, famotidine, Vitamin D3, and magnesium oxide.  Meds ordered this encounter  Medications  . Cholecalciferol (VITAMIN D3) 10000 UNITS TABS    Sig: Take 1 tablet by mouth daily.  . magnesium oxide (MAG-OX) 400 MG tablet    Sig: Take 400 mg by mouth daily.    There are no discontinued medications.  Follow-up: Return in about 3 months (around 02/01/2016).   Crecencio Mc, MD

## 2015-11-04 LAB — RPR

## 2015-11-04 LAB — HEPATITIS C ANTIBODY: HCV AB: NEGATIVE

## 2015-11-05 DIAGNOSIS — Z0000 Encounter for general adult medical examination without abnormal findings: Secondary | ICD-10-CM | POA: Insufficient documentation

## 2015-11-05 NOTE — Assessment & Plan Note (Signed)
He is due for annual surveillance.

## 2015-11-05 NOTE — Assessment & Plan Note (Addendum)
Annual comprehensive preventive exam was done as well as an evaluation and management of chronic conditions .  During the course of the visit the patient was educated and counseled about appropriate screening and preventive services including :  diabetes screening, lipid analysis with projected  10 year  risk for CAD,  nutrition counseling, colorectal cancer screening, and recommended immunizations.  Printed recommendations for health maintenance screenings was given.   Lab Results  Component Value Date   PSA 0.86 11/03/2015

## 2015-11-05 NOTE — Assessment & Plan Note (Signed)
LDL and triglycerides are at goal without  medications.  Lab Results  Component Value Date   CHOL 187 11/03/2015   HDL 54.40 11/03/2015   LDLCALC 118* 11/03/2015   LDLDIRECT 121.0 11/03/2015   TRIG 71.0 11/03/2015   CHOLHDL 3 11/03/2015

## 2015-11-05 NOTE — Assessment & Plan Note (Signed)
He has lowered his a1c to a normal range by losing weight and reducing the sugar in his diet.   Lab Results  Component Value Date   HGBA1C 5.6 11/03/2015

## 2015-11-07 ENCOUNTER — Encounter: Payer: Self-pay | Admitting: Internal Medicine

## 2015-11-09 ENCOUNTER — Other Ambulatory Visit: Payer: Self-pay | Admitting: Internal Medicine

## 2015-11-09 DIAGNOSIS — I714 Abdominal aortic aneurysm, without rupture, unspecified: Secondary | ICD-10-CM

## 2015-11-11 ENCOUNTER — Ambulatory Visit
Admission: RE | Admit: 2015-11-11 | Discharge: 2015-11-11 | Disposition: A | Discharge status: Home / Self Care | Payer: Medicare Other | Source: Ambulatory Visit | Attending: Internal Medicine | Admitting: Internal Medicine

## 2015-11-11 DIAGNOSIS — K5730 Diverticulosis of large intestine without perforation or abscess without bleeding: Secondary | ICD-10-CM | POA: Insufficient documentation

## 2015-11-11 DIAGNOSIS — K449 Diaphragmatic hernia without obstruction or gangrene: Secondary | ICD-10-CM | POA: Diagnosis not present

## 2015-11-11 DIAGNOSIS — N400 Enlarged prostate without lower urinary tract symptoms: Secondary | ICD-10-CM | POA: Insufficient documentation

## 2015-11-11 DIAGNOSIS — I714 Abdominal aortic aneurysm, without rupture, unspecified: Secondary | ICD-10-CM

## 2015-11-11 MED ORDER — IOHEXOL 350 MG/ML SOLN
100.0000 mL | Freq: Once | INTRAVENOUS | Status: AC | PRN
  Administered 2015-11-11: 100 mL via INTRAVENOUS

## 2015-11-15 ENCOUNTER — Encounter: Payer: Self-pay | Admitting: Internal Medicine

## 2015-11-15 DIAGNOSIS — I700 Atherosclerosis of aorta: Secondary | ICD-10-CM | POA: Insufficient documentation

## 2015-12-01 ENCOUNTER — Ambulatory Visit: Payer: Medicare Other | Admitting: Internal Medicine

## 2015-12-01 DIAGNOSIS — X32XXXA Exposure to sunlight, initial encounter: Secondary | ICD-10-CM | POA: Diagnosis not present

## 2015-12-01 DIAGNOSIS — D485 Neoplasm of uncertain behavior of skin: Secondary | ICD-10-CM | POA: Diagnosis not present

## 2015-12-01 DIAGNOSIS — C44519 Basal cell carcinoma of skin of other part of trunk: Secondary | ICD-10-CM | POA: Diagnosis not present

## 2015-12-01 DIAGNOSIS — D0439 Carcinoma in situ of skin of other parts of face: Secondary | ICD-10-CM | POA: Diagnosis not present

## 2015-12-01 DIAGNOSIS — Z85828 Personal history of other malignant neoplasm of skin: Secondary | ICD-10-CM | POA: Diagnosis not present

## 2015-12-01 DIAGNOSIS — D225 Melanocytic nevi of trunk: Secondary | ICD-10-CM | POA: Diagnosis not present

## 2015-12-01 DIAGNOSIS — L570 Actinic keratosis: Secondary | ICD-10-CM | POA: Diagnosis not present

## 2015-12-03 ENCOUNTER — Ambulatory Visit (INDEPENDENT_AMBULATORY_CARE_PROVIDER_SITE_OTHER): Payer: Medicare Other | Admitting: Internal Medicine

## 2015-12-03 ENCOUNTER — Encounter: Payer: Self-pay | Admitting: Internal Medicine

## 2015-12-03 VITALS — BP 104/60 | HR 54 | Temp 97.6°F | Resp 12 | Ht 70.0 in | Wt 195.5 lb

## 2015-12-03 DIAGNOSIS — I700 Atherosclerosis of aorta: Secondary | ICD-10-CM | POA: Diagnosis not present

## 2015-12-03 DIAGNOSIS — E785 Hyperlipidemia, unspecified: Secondary | ICD-10-CM | POA: Diagnosis not present

## 2015-12-03 DIAGNOSIS — Z79899 Other long term (current) drug therapy: Secondary | ICD-10-CM | POA: Diagnosis not present

## 2015-12-03 DIAGNOSIS — I714 Abdominal aortic aneurysm, without rupture, unspecified: Secondary | ICD-10-CM

## 2015-12-03 DIAGNOSIS — K5730 Diverticulosis of large intestine without perforation or abscess without bleeding: Secondary | ICD-10-CM | POA: Diagnosis not present

## 2015-12-03 MED ORDER — ATORVASTATIN CALCIUM 20 MG PO TABS: 20.0000 mg | ORAL_TABLET | Freq: Every day | ORAL | Status: DC

## 2015-12-03 NOTE — Progress Notes (Signed)
Pre-visit discussion using our clinic review tool. No additional management support is needed unless otherwise documented below in the visit note.  

## 2015-12-03 NOTE — Progress Notes (Signed)
Subjective:  Patient ID: Jamie Zhang, male    DOB: 1941-05-03  Age: 75 y.o. MRN: [id number]  CC: The primary encounter diagnosis was Long-term use of high-risk medication. Diagnoses of Hyperlipidemia, Atherosclerosis of aorta without gangrene ([place]), Diverticulosis of colon without hemorrhage, and Abdominal aortic aneurysm (AAA) without rupture ([place]) were also pertinent to this visit.  HPI Jamie Zhang presents for discussion of mulitple abnormalities seen on  recent CT done of abdomen and  pelvis for annual surveillance of  AAA .  Atherosclerotic placque was noted in all major arteries and atherosclerotic ulceration noted in the aorta.  Diverticulosis was also found.     A total of 40 minutes was spent with patient and wife  In reviewing the results of the CT scan in detail and both conditions were discussed in detail.    Discussed need for statin therapy, high potency .     Outpatient Prescriptions Prior to Visit  Medication Sig Dispense Refill  . alendronate (FOSAMAX) 70 MG tablet TAKE 1 TABLET ONE TIME A WEEK. TAKE WITH A FULL GLASS OF WATER ON AN EMPTY STOMACH. 12 tablet 5  . Calcium Citrate-Vitamin D (CITRACAL + D PO) Take 650 mg by mouth daily. 2 tablets daily    . Cholecalciferol (VITAMIN D3) 10000 UNITS TABS Take 1 tablet by mouth daily.    . famotidine (PEPCID) 40 MG tablet TAKE 1 TABLET EVERY DAY 90 tablet 1  . Flax Oil-Fish Oil-Borage Oil ([employer] OMEGA-3) CAPS Take per instructions 30 capsule 6  . fluticasone (FLONASE) 50 MCG/ACT nasal spray PLACE 2 SPRAYS INTO BOTH NOSTRILS DAILY. 48 g 2  . magnesium oxide (MAG-OX) 400 MG tablet Take 400 mg by mouth daily.     No facility-administered medications prior to visit.    Review of Systems;  Patient denies headache, fevers, malaise, unintentional weight loss, skin rash, eye pain, sinus congestion and sinus pain, sore throat, dysphagia,  hemoptysis , cough, dyspnea, wheezing, chest pain, palpitations, orthopnea, edema, abdominal pain,  nausea, melena, diarrhea, constipation, flank pain, dysuria, hematuria, urinary  Frequency, nocturia, numbness, tingling, seizures,  Focal weakness, Loss of consciousness,  Tremor, insomnia, depression, anxiety, and suicidal ideation.      Objective:  BP 104/60 mmHg  Pulse 54  Temp(Src) 97.6 F (36.4 C) (Oral)  Resp 12  Ht 5\' 10"  (1.778 m)  Wt 195 lb 8 oz (88.678 kg)  BMI 28.05 kg/m2  SpO2 95%  BP Readings from Last 3 Encounters:  12/03/15 104/60  11/03/15 112/70  05/04/15 104/60    Wt Readings from Last 3 Encounters:  12/03/15 195 lb 8 oz (88.678 kg)  11/03/15 198 lb 8 oz (90.039 kg)  05/04/15 196 lb (88.905 kg)    General appearance: alert, cooperative and appears stated age Lungs: clear to auscultation bilaterally Heart: regular rate and rhythm, S1, S2 normal, no murmur, click, rub or gallop Abdomen: soft, non-tender; bowel sounds normal; no masses,  no organomegaly Pulses: 2+ and symmetric Skin: Skin color, texture, turgor normal. No rashes or lesions Lymph nodes: Cervical, supraclavicular, and axillary nodes normal.  Lab Results  Component Value Date   HGBA1C 5.6 11/03/2015   HGBA1C 5.6 05/05/2015   HGBA1C 6.1 11/02/2014    Lab Results  Component Value Date   CREATININE 0.95 11/03/2015   CREATININE 1.05 05/05/2015   CREATININE 1.1 04/03/2014    Lab Results  Component Value Date   WBC 6.5 11/03/2015   HGB 15.6 11/03/2015   HCT 47.0 11/03/2015  PLT 196.0 11/03/2015   GLUCOSE 90 11/03/2015   CHOL 187 11/03/2015   TRIG 71.0 11/03/2015   HDL 54.40 11/03/2015   LDLDIRECT 121.0 11/03/2015   LDLCALC 118* 11/03/2015   ALT 14 11/03/2015   AST 15 11/03/2015   NA 142 11/03/2015   K 4.5 11/03/2015   CL 106 11/03/2015   CREATININE 0.95 11/03/2015   BUN 27* 11/03/2015   CO2 28 11/03/2015   TSH 1.54 11/03/2015   PSA 0.86 11/03/2015   HGBA1C 5.6 11/03/2015    Ct Angio Abd/pel W/ And/or W/o  11/11/2015  CLINICAL DATA:  Evaluate abdominal aortic  aneurysm. EXAM: CT ANGIOGRAPHY ABDOMEN AND PELVIS WITH CONTRAST TECHNIQUE: Multidetector CT imaging of the abdomen and pelvis was performed using the standard protocol during bolus administration of intravenous contrast. Multiplanar reconstructed images including MIPs were obtained and reviewed to evaluate the vascular anatomy. CONTRAST:  181mL OMNIPAQUE IOHEXOL 350 MG/ML SOLN COMPARISON:  CT abdomen pelvis - 09/23/2014; 03/13/2014 FINDINGS: Vascular Findings: Abdominal aorta: Large amount of mixed calcified and noncalcified slightly irregular atherosclerotic plaque throughout the abdominal aorta, not resulting in a hemodynamically significant stenosis. There are several tandem penetrating atherosclerotic ulcers involving primarily the right lateral aspect of the abdominal wall with dominant ulcer measuring approximately 2.2 by 0.8 cm (best seen on coronal image 79, series 9). A nonocclusive intimal web is seen within distal aspect of the abdominal aorta (image 85, series 4). No abdominal aortic dissection or periaortic stranding. Minimal increase in size of infrarenal abdominal aortic aneurysm now measuring approximately 3.3 x 3.4 x 3.5 cm (as measured in greatest oblique axial (image 86, series 4), coronal (image 79, series 9) and sagittal (image 100, series 10) dimensions respectively, previously, 3.4 x 3.2 x 3.2 cm. The aneurysm begins at the level of the take-off of the most caudal right renal artery and extends to involve the bilateral common iliac arteries. Celiac artery: Widely patent without hemodynamically significant narrowing. Conventional branching pattern. SMA: There is a minimal amount of eccentric mixed calcified and noncalcified atherosclerotic plaque involving the origin proximal aspect of the SMA, not resulting in hemodynamically significant stenosis. A replaced right hepatic artery arises from the proximal SMA. The distal tributaries the SMA are widely patent without discrete intraluminal filling  defect to suggest distal embolism. Right Renal artery: Duplicated ; co-dominant. Both right-sided renal arteries are widely patent without hemodynamically significant narrowing. No vessel irregularity to suggest FMD. Left Renal artery: Solitary; widely patent without hemodynamically significant narrowing. No vessel irregularity to suggest FMD. IMA: Widely patent without hemodynamically significant narrowing. Right-sided pelvic vasculature: There is a moderate amount of eccentric mixed calcified and noncalcified atherosclerotic plaque within the right common and external iliac arteries, not resulting in hemodynamically significant stenosis. Note is made of a intimal web projecting into the lumen of the right common iliac artery (image 106, series 4), not resulting in hemodynamically significant stenosis. The right common iliac artery is mildly ectatic measuring 1.2 cm in diameter (image 110, series 4), minimally increased in size since the 03/2014 examination, previously, 1.8 cm. The right internal iliac artery is diseased though patent and of normal caliber. Left-sided pelvic vasculature: There is a moderate amount of eccentric mixed calcified and noncalcified atherosclerotic plaque throughout the left common iliac artery, not resulting in hemodynamically significant stenosis. There is a intimal web involving the left common iliac artery (axial image 109, series 4, coronal image 82, series 9), not resulting in hemodynamically significant stenosis. The left common iliac artery is mildly ectatic, measuring  1.9 cm in diameter, previously, 1.7 cm. The left internal iliac artery is disease though patent and of normal caliber. Review of the MIP images confirms the above findings. -------------------------------------------------------------------------------- Nonvascular Findings: Normal hepatic contour. No discrete hepatic lesions. Normal appearance of the gallbladder given degree distention. No radiopaque gallstones. No  intra extrahepatic bili duct dilatation. No ascites. There is symmetric enhancement and excretion of the bilateral kidneys. No definite renal stones in this postcontrast examination. No discrete renal lesions. No urinary obstruction or perinephric stranding. Normal appearance of the bilateral adrenal glands, pancreas and spleen. Moderate to large size hiatal hernia. Scattered colonic diverticulosis without evidence of diverticulitis. The bowel is otherwise normal in course and caliber without wall thickening. Normal appearance of the terminal ileum and retrocecal appendix. No pneumoperitoneum, pneumatosis or portal venous gas. No bulky retroperitoneal, mesenteric, pelvic or inguinal lymphadenopathy. The prostate is borderline enlarged with mass effect upon the undersurface of the urinary bladder. There is mild thickening of the urinary bladder wall, potentially accentuated due to underdistention. Several phleboliths are seen within the lower pelvis bilaterally, right greater than left. No free fluid in the pelvic cul-de-sac. Limited visualization of the lower thorax is negative for focal airspace opacity or pleural effusion. Normal heart size.  No pericardial effusion. No acute or aggressive osseous abnormalities. 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 Small mesenteric fat containing periumbilical hernia. Post left-sided inguinal hernia repair with tiny residual left-sided mesenteric fat containing inguinal hernia. IMPRESSION: Vascular Impression: 1. Minimal enlargement of infrarenal abdominal aortic aneurysm, currently measuring 3.3 x 3.4 x 3.5 cm, previously, 3.4 x 3.2 x 3.2 cm. 2. There is a large amount of eccentric mixed calcified and noncalcified atherosclerotic plaque throughout the abdominal aorta extending to involve the bilateral common iliac arteries with associated penetrating atherosclerotic ulcers and intimal webs, not resulting in a hemodynamically significant stenosis. No abdominal aortic  dissection or periaortic stranding. 3. 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 4. Mild ectasia of the bilateral common iliac arteries, the right measuring 2.1 cm, the left measuring 1.9 cm. Nonvascular Impression: 1. Colonic diverticulosis without evidence of diverticulitis. 2. Moderate to large-sized hiatal hernia. 3. Prostate is borderline enlarged with mass effect upon the undersurface of the urinary bladder. Electronically Signed   By: Sandi Mariscal M.D.   On: 11/11/2015 15:47    Assessment & Plan:   Problem List Items Addressed This Visit    Abdominal aortic aneurysm ([place])    No significant change was seen. BP is well controlled and statin therapy initiated.  given the atherosclerotic ulcerations and need for vascular follow up,  Referral to Dr Ella Jubilee was dicussed.       Relevant Medications   atorvastatin (LIPITOR) 20 MG tablet   Atherosclerosis of aorta without gangrene ([place])    Advised to start statin therapy and resume aspirin.  Risks and benefits of statins discussed.       Relevant Medications   atorvastatin (LIPITOR) 20 MG tablet   Diverticulosis of colon without hemorrhage    Discussed diagnosis with patient, long term complications, and management        Other Visit Diagnoses    Long-term use of high-risk medication    -  Primary    Relevant Orders    Comprehensive metabolic panel    Hyperlipidemia        Relevant Medications    atorvastatin (LIPITOR) 20 MG tablet    Other Relevant Orders    LDL cholesterol, direct       I  am having Mr. Diamant start on atorvastatin. I am also having him maintain his [employer] OMEGA-3, Calcium Citrate-Vitamin D (CITRACAL + D PO), alendronate, famotidine, Vitamin D3, magnesium oxide, and fluticasone.  Meds ordered this encounter  Medications  . atorvastatin (LIPITOR) 20 MG tablet    Sig: Take 1 tablet (20 mg total) by mouth daily.    Dispense:  90 tablet    Refill:  1    There are no discontinued  medications.  Follow-up: Return in about 3 years (around 12/02/2018), or cmeet, nonfasting .   Crecencio Mc, MD

## 2015-12-03 NOTE — Patient Instructions (Addendum)
I am sending you to Dr Delana Meyer to follow up on your aortic aneurysm .  He is a vascular surgeon   I am recommending you start a high potency cholesterol medication .   You can also take a supplement called CoQ 10 if you develop muscle soreness   Return in 3-4 weeks for liver enzyme testing,  And then every 6 months thereafter   Please resume your baby aspirin (81 mg) daily

## 2015-12-05 DIAGNOSIS — K5730 Diverticulosis of large intestine without perforation or abscess without bleeding: Secondary | ICD-10-CM | POA: Insufficient documentation

## 2015-12-05 NOTE — Assessment & Plan Note (Signed)
No significant change was seen. BP is well controlled and statin therapy initiated.  given the atherosclerotic ulcerations and need for vascular follow up,  Referral to Dr Ella Jubilee was dicussed.

## 2015-12-05 NOTE — Assessment & Plan Note (Signed)
Discussed diagnosis with patient, long term complications, and management

## 2015-12-05 NOTE — Assessment & Plan Note (Signed)
Advised to start statin therapy and resume aspirin.  Risks and benefits of statins discussed.

## 2015-12-08 ENCOUNTER — Ambulatory Visit: Payer: Medicare Other

## 2015-12-24 ENCOUNTER — Other Ambulatory Visit (INDEPENDENT_AMBULATORY_CARE_PROVIDER_SITE_OTHER): Payer: Medicare Other

## 2015-12-24 DIAGNOSIS — E785 Hyperlipidemia, unspecified: Secondary | ICD-10-CM | POA: Diagnosis not present

## 2015-12-24 DIAGNOSIS — Z79899 Other long term (current) drug therapy: Secondary | ICD-10-CM | POA: Diagnosis not present

## 2015-12-24 LAB — COMPREHENSIVE METABOLIC PANEL
ALK PHOS: 39 U/L (ref 39–117)
ALT: 15 U/L (ref 0–53)
AST: 17 U/L (ref 0–37)
Albumin: 4.2 g/dL (ref 3.5–5.2)
BUN: 28 mg/dL — AB (ref 6–23)
CHLORIDE: 107 meq/L (ref 96–112)
CO2: 30 meq/L (ref 19–32)
Calcium: 9.9 mg/dL (ref 8.4–10.5)
Creatinine, Ser: 1.04 mg/dL (ref 0.40–1.50)
GFR: 73.86 mL/min (ref >60.00)
GLUCOSE: 92 mg/dL (ref 70–99)
POTASSIUM: 4.4 meq/L (ref 3.5–5.1)
SODIUM: 142 meq/L (ref 135–145)
TOTAL PROTEIN: 6.9 g/dL (ref 6.0–8.3)
Total Bilirubin: 1.7 mg/dL — ABNORMAL HIGH (ref 0.2–1.2)

## 2015-12-24 LAB — LDL CHOLESTEROL, DIRECT: Direct LDL: 47 mg/dL

## 2015-12-26 ENCOUNTER — Encounter: Payer: Self-pay | Admitting: Internal Medicine

## 2015-12-31 DIAGNOSIS — D0439 Carcinoma in situ of skin of other parts of face: Secondary | ICD-10-CM | POA: Diagnosis not present

## 2015-12-31 DIAGNOSIS — L905 Scar conditions and fibrosis of skin: Secondary | ICD-10-CM | POA: Diagnosis not present

## 2016-01-07 DIAGNOSIS — C44519 Basal cell carcinoma of skin of other part of trunk: Secondary | ICD-10-CM | POA: Diagnosis not present

## 2016-01-11 DIAGNOSIS — H26493 Other secondary cataract, bilateral: Secondary | ICD-10-CM | POA: Diagnosis not present

## 2016-02-02 ENCOUNTER — Ambulatory Visit (INDEPENDENT_AMBULATORY_CARE_PROVIDER_SITE_OTHER): Payer: Medicare Other | Admitting: Internal Medicine

## 2016-02-02 ENCOUNTER — Encounter: Payer: Self-pay | Admitting: Internal Medicine

## 2016-02-02 VITALS — BP 98/60 | HR 53 | Temp 97.4°F | Resp 12 | Ht 70.0 in | Wt 192.5 lb

## 2016-02-02 DIAGNOSIS — I700 Atherosclerosis of aorta: Secondary | ICD-10-CM | POA: Diagnosis not present

## 2016-02-02 DIAGNOSIS — M545 Low back pain, unspecified: Secondary | ICD-10-CM

## 2016-02-02 DIAGNOSIS — E785 Hyperlipidemia, unspecified: Secondary | ICD-10-CM

## 2016-02-02 DIAGNOSIS — I714 Abdominal aortic aneurysm, without rupture, unspecified: Secondary | ICD-10-CM

## 2016-02-02 DIAGNOSIS — R351 Nocturia: Secondary | ICD-10-CM

## 2016-02-02 DIAGNOSIS — N401 Enlarged prostate with lower urinary tract symptoms: Secondary | ICD-10-CM

## 2016-02-02 MED ORDER — DICLOFENAC SODIUM 75 MG PO TBEC: 75.0000 mg | DELAYED_RELEASE_TABLET | Freq: Two times a day (BID) | ORAL | Status: DC

## 2016-02-02 NOTE — Progress Notes (Signed)
Subjective:  Patient ID: Jamie Zhang, male    DOB: 1941/03/08  Age: 75 y.o. MRN: [id number]  CC: The primary encounter diagnosis was Abdominal aortic aneurysm (AAA) without rupture ([place]). 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  HPI HULISES FIGGINS presents for follow up on low back pain and atherosclerosis noted on recent CT scan which noted an abdominal aortic aneursym.  At his last visit he was advised to start stain therapy.  He has been taking atorvastatin for primary prevention given CT findings and is tolerating the medication.   Low back pain ;  Recurrent, aggravated by golfing,  Bending over.  Does not radiate down either leg .Denies leg weakness,  Numbness or bowel/bladder incontinence.   Nocturia x 3   Outpatient Prescriptions Prior to Visit  Medication Sig Dispense Refill  . alendronate (FOSAMAX) 70 MG tablet TAKE 1 TABLET ONE TIME A WEEK. TAKE WITH A FULL GLASS OF WATER ON AN EMPTY STOMACH. 12 tablet 5  . atorvastatin (LIPITOR) 20 MG tablet Take 1 tablet (20 mg total) by mouth daily. 90 tablet 1  . Calcium Citrate-Vitamin D (CITRACAL + D PO) Take 650 mg by mouth daily. 2 tablets daily    . Cholecalciferol (VITAMIN D3) 10000 UNITS TABS Take 1 tablet by mouth daily.    . famotidine (PEPCID) 40 MG tablet TAKE 1 TABLET EVERY DAY 90 tablet 1  . Flax Oil-Fish Oil-Borage Oil ([employer] OMEGA-3) CAPS Take per instructions 30 capsule 6  . fluticasone (FLONASE) 50 MCG/ACT nasal spray PLACE 2 SPRAYS INTO BOTH NOSTRILS DAILY. 48 g 2  . magnesium oxide (MAG-OX) 400 MG tablet Take 400 mg by mouth daily.     No facility-administered medications prior to visit.    Review of Systems;  Patient denies headache, fevers, malaise, unintentional weight loss, skin rash, eye pain, sinus congestion and sinus pain, sore throat, dysphagia,  hemoptysis ,  cough, dyspnea, wheezing, chest pain, palpitations, orthopnea, edema, abdominal pain, nausea, melena, diarrhea, constipation, flank pain, dysuria, hematuria, urinary  Frequency, nocturia, numbness, tingling, seizures,  Focal weakness, Loss of consciousness,  Tremor, insomnia, depression, anxiety, and suicidal ideation.      Objective:  BP 98/60 mmHg  Pulse 53  Temp(Src) 97.4 F (36.3 C) (Oral)  Resp 12  Ht 5\' 10"  (1.778 m)  Wt 192 lb 8 oz (87.317 kg)  BMI 27.62 kg/m2  SpO2 97%  BP Readings from Last 3 Encounters:  02/02/16 98/60  12/03/15 104/60  11/03/15 112/70    Wt Readings from Last 3 Encounters:  02/02/16 192 lb 8 oz (87.317 kg)  12/03/15 195 lb 8 oz (88.678 kg)  11/03/15 198 lb 8 oz (90.039 kg)    General appearance: alert, cooperative and appears stated age Ears: normal TM's and external ear canals both ears Throat: lips, mucosa, and tongue normal; teeth and gums normal Neck: no adenopathy, no carotid bruit, supple, symmetrical, trachea midline and thyroid not enlarged, symmetric, no tenderness/mass/nodules Back: symmetric, no curvature. ROM normal. No CVA tenderness. Lungs: clear to auscultation bilaterally Heart: regular rate and rhythm, S1, S2 normal, no murmur, click, rub or gallop Abdomen: soft, non-tender; bowel sounds normal; no masses,  no organomegaly Pulses: 2+ and symmetric Skin: Skin color, texture, turgor normal. No rashes or lesions Lymph nodes: Cervical, supraclavicular, and axillary nodes normal.  Lab Results  Component Value Date   HGBA1C 5.6 11/03/2015  HGBA1C 5.6 05/05/2015   HGBA1C 6.1 11/02/2014    Lab Results  Component Value Date   CREATININE 1.04 12/24/2015   CREATININE 0.95 11/03/2015   CREATININE 1.05 05/05/2015    Lab Results  Component Value Date   WBC 6.5 11/03/2015   HGB 15.6 11/03/2015   HCT 47.0 11/03/2015   PLT 196.0 11/03/2015   GLUCOSE 92 12/24/2015   CHOL 187 11/03/2015   TRIG 71.0 11/03/2015   HDL 54.40  11/03/2015   LDLDIRECT 47.0 12/24/2015   LDLCALC 118* 11/03/2015   ALT 15 12/24/2015   AST 17 12/24/2015   NA 142 12/24/2015   K 4.4 12/24/2015   CL 107 12/24/2015   CREATININE 1.04 12/24/2015   BUN 28* 12/24/2015   CO2 30 12/24/2015   TSH 1.54 11/03/2015   PSA 0.86 11/03/2015   HGBA1C 5.6 11/03/2015    Ct Angio Abd/pel W/ And/or W/o  11/11/2015  CLINICAL DATA:  Evaluate abdominal aortic aneurysm. EXAM: CT ANGIOGRAPHY ABDOMEN AND PELVIS WITH CONTRAST TECHNIQUE: Multidetector CT imaging of the abdomen and pelvis was performed using the standard protocol during bolus administration of intravenous contrast. Multiplanar reconstructed images including MIPs were obtained and reviewed to evaluate the vascular anatomy. CONTRAST:  176mL OMNIPAQUE IOHEXOL 350 MG/ML SOLN COMPARISON:  CT abdomen pelvis - 09/23/2014; 03/13/2014 FINDINGS: Vascular Findings: Abdominal aorta: Large amount of mixed calcified and noncalcified slightly irregular atherosclerotic plaque throughout the abdominal aorta, not resulting in a hemodynamically significant stenosis. There are several tandem penetrating atherosclerotic ulcers involving primarily the right lateral aspect of the abdominal wall with dominant ulcer measuring approximately 2.2 by 0.8 cm (best seen on coronal image 79, series 9). A nonocclusive intimal web is seen within distal aspect of the abdominal aorta (image 85, series 4). No abdominal aortic dissection or periaortic stranding. Minimal increase in size of infrarenal abdominal aortic aneurysm now measuring approximately 3.3 x 3.4 x 3.5 cm (as measured in greatest oblique axial (image 86, series 4), coronal (image 79, series 9) and sagittal (image 100, series 10) dimensions respectively, previously, 3.4 x 3.2 x 3.2 cm. The aneurysm begins at the level of the take-off of the most caudal right renal artery and extends to involve the bilateral common iliac arteries. Celiac artery: Widely patent without  hemodynamically significant narrowing. Conventional branching pattern. SMA: There is a minimal amount of eccentric mixed calcified and noncalcified atherosclerotic plaque involving the origin proximal aspect of the SMA, not resulting in hemodynamically significant stenosis. A replaced right hepatic artery arises from the proximal SMA. The distal tributaries the SMA are widely patent without discrete intraluminal filling defect to suggest distal embolism. Right Renal artery: Duplicated ; co-dominant. Both right-sided renal arteries are widely patent without hemodynamically significant narrowing. No vessel irregularity to suggest FMD. Left Renal artery: Solitary; widely patent without hemodynamically significant narrowing. No vessel irregularity to suggest FMD. IMA: Widely patent without hemodynamically significant narrowing. Right-sided pelvic vasculature: There is a moderate amount of eccentric mixed calcified and noncalcified atherosclerotic plaque within the right common and external iliac arteries, not resulting in hemodynamically significant stenosis. Note is made of a intimal web projecting into the lumen of the right common iliac artery (image 106, series 4), not resulting in hemodynamically significant stenosis. The right common iliac artery is mildly ectatic measuring 1.2 cm in diameter (image 110, series 4), minimally increased in size since the 03/2014 examination, previously, 1.8 cm. The right internal iliac artery is diseased though patent and of normal caliber. Left-sided pelvic vasculature: There is a  moderate amount of eccentric mixed calcified and noncalcified atherosclerotic plaque throughout the left common iliac artery, not resulting in hemodynamically significant stenosis. There is a intimal web involving the left common iliac artery (axial image 109, series 4, coronal image 82, series 9), not resulting in hemodynamically significant stenosis. The left common iliac artery is mildly ectatic,  measuring 1.9 cm in diameter, previously, 1.7 cm. The left internal iliac artery is disease though patent and of normal caliber. Review of the MIP images confirms the above findings. -------------------------------------------------------------------------------- Nonvascular Findings: Normal hepatic contour. No discrete hepatic lesions. Normal appearance of the gallbladder given degree distention. No radiopaque gallstones. No intra extrahepatic bili duct dilatation. No ascites. There is symmetric enhancement and excretion of the bilateral kidneys. No definite renal stones in this postcontrast examination. No discrete renal lesions. No urinary obstruction or perinephric stranding. Normal appearance of the bilateral adrenal glands, pancreas and spleen. Moderate to large size hiatal hernia. Scattered colonic diverticulosis without evidence of diverticulitis. The bowel is otherwise normal in course and caliber without wall thickening. Normal appearance of the terminal ileum and retrocecal appendix. No pneumoperitoneum, pneumatosis or portal venous gas. No bulky retroperitoneal, mesenteric, pelvic or inguinal lymphadenopathy. The prostate is borderline enlarged with mass effect upon the undersurface of the urinary bladder. There is mild thickening of the urinary bladder wall, potentially accentuated due to underdistention. Several phleboliths are seen within the lower pelvis bilaterally, right greater than left. No free fluid in the pelvic cul-de-sac. Limited visualization of the lower thorax is negative for focal airspace opacity or pleural effusion. Normal heart size.  No pericardial effusion. No acute or aggressive osseous abnormalities. 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 Small mesenteric fat containing periumbilical hernia. Post left-sided inguinal hernia repair with tiny residual left-sided mesenteric fat containing inguinal hernia. IMPRESSION: Vascular Impression: 1. Minimal enlargement of infrarenal  abdominal aortic aneurysm, currently measuring 3.3 x 3.4 x 3.5 cm, previously, 3.4 x 3.2 x 3.2 cm. 2. There is a large amount of eccentric mixed calcified and noncalcified atherosclerotic plaque throughout the abdominal aorta extending to involve the bilateral common iliac arteries with associated penetrating atherosclerotic ulcers and intimal webs, not resulting in a hemodynamically significant stenosis. No abdominal aortic dissection or periaortic stranding. 3. 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 4. Mild ectasia of the bilateral common iliac arteries, the right measuring 2.1 cm, the left measuring 1.9 cm. Nonvascular Impression: 1. Colonic diverticulosis without evidence of diverticulitis. 2. Moderate to large-sized hiatal hernia. 3. Prostate is borderline enlarged with mass effect upon the undersurface of the urinary bladder. Electronically Signed   By: Sandi Mariscal M.D.   On: 11/11/2015 15:47    Assessment & Plan:   Problem List Items Addressed This Visit    HLD (hyperlipidemia)    toleraitng lipitor ,  Started for CT findignsi of atherosclerosis  Lab Results  Component Value Date   CHOL 187 11/03/2015   HDL 54.40 11/03/2015   LDLCALC 118* 11/03/2015   LDLDIRECT 47.0 12/24/2015   TRIG 71.0 11/03/2015   CHOLHDL 3 11/03/2015   Lab Results  Component Value Date   ALT 15 12/24/2015   AST 17 12/24/2015   ALKPHOS 39 12/24/2015   BILITOT 1.7* 12/24/2015         Abdominal aortic aneurysm ([place]) - Primary    Discussed referral to [facility] for 4 cm AA noted on CT scan initially in 2015      Relevant Orders   Ambulatory referral to Vascular Surgery   Atherosclerosis of aorta  without gangrene ([place])    Now on statin therapy.    Referral to Vascular surgery for monitoring, of  aortic aneurysm      Low back pain without sciatica    No pathologic or worrisome features .  Trial of diclofenac and tylenol.      Relevant Medications   diclofenac (VOLTAREN) 75 MG  EC tablet   BPH associated with nocturia    Seen on Dec 2016 Ct,  With nocturia x 3.  He has deferred therapy and referral to Urology         A total of 25 minutes of face to face time was spent with patient more than half of which was spent in counselling about the above mentioned conditions  and coordination of care  I am having Mr. Schneekloth start on diclofenac. I am also having him maintain his [employer] OMEGA-3, Calcium Citrate-Vitamin D (CITRACAL + D PO), alendronate, famotidine, Vitamin D3, magnesium oxide, fluticasone, and atorvastatin.  Meds ordered this encounter  Medications  . diclofenac (VOLTAREN) 75 MG EC tablet    Sig: Take 1 tablet (75 mg total) by mouth 2 (two) times daily.    Dispense:  60 tablet    Refill:  2    There are no discontinued medications.  Follow-up: No Follow-up on file.   Crecencio Mc, MD

## 2016-02-02 NOTE — Assessment & Plan Note (Addendum)
Discussed referral to [facility] for 4 cm AA noted on CT scan initially in 2015

## 2016-02-02 NOTE — Progress Notes (Signed)
Pre-visit discussion using our clinic review tool. No additional management support is needed unless otherwise documented below in the visit note.  

## 2016-02-02 NOTE — Patient Instructions (Signed)
Diclofenac can be taken up to 2 times daily and combined with tylenol.  maxiumum daily tylenol dose is 2000 mg daily  I am referring you to Dr Dew/Schnier (vascular surgery) for follow up on your aneurysm    Back Injury Prevention Back injuries can be very painful. They can also be difficult to heal. After having one back injury, you are more likely to injure your back again. It is important to learn how to avoid injuring or re-injuring your back. The following tips can help you to prevent a back injury. WHAT SHOULD I KNOW ABOUT PHYSICAL FITNESS?  Exercise for 30 minutes per day on most days of the week or as directed by your health care provider. Make sure to:  Do aerobic exercises, such as walking, jogging, biking, or swimming.  Do exercises that increase balance and strength, such as tai chi and yoga. These can decrease your risk of falling and injuring your back.  Do stretching exercises to help with flexibility.  Try to develop strong abdominal muscles. Your abdominal muscles provide a lot of the support that is needed by your back.  Maintain a healthy weight. This helps to decrease your risk of a back injury. WHAT SHOULD I KNOW ABOUT MY DIET?  Talk with your health care provider about your overall diet. Take supplements and vitamins only as directed by your health care provider.  Talk with your health care provider about how much calcium and vitamin D you need each day. These nutrients help to prevent weakening of the bones (osteoporosis). Osteoporosis can cause broken (fractured) bones, which lead to back pain.  Include good sources of calcium in your diet, such as dairy products, green leafy vegetables, and products that have had calcium added to them (fortified).  Include good sources of vitamin D in your diet, such as milk and foods that are fortified with vitamin D. WHAT SHOULD I KNOW ABOUT MY POSTURE?  Sit up straight and stand up straight. Avoid leaning forward when you  sit or hunching over when you stand.  Choose chairs that have good low-back (lumbar) support.  If you work at a desk, sit close to it so you do not need to lean over. Keep your chin tucked in. Keep your neck drawn back, and keep your elbows bent at a right angle. Your arms should look like the letter "L."  Sit high and close to the steering wheel when you drive. Add a lumbar support to your car seat, if needed.  Avoid sitting or standing in one position for very long. Take breaks to get up, stretch, and walk around at least one time every hour. Take breaks every hour if you are driving for long periods of time.  Sleep on your side with your knees slightly bent, or sleep on your back with a pillow under your knees. Do not lie on the front of your body to sleep. WHAT SHOULD I KNOW ABOUT LIFTING, TWISTING, AND REACHING? Lifting and Heavy Lifting  Avoid heavy lifting, especially repetitive heavy lifting. If you must do heavy lifting:  Stretch before lifting.  Work slowly.  Rest between lifts.  Use a tool such as a cart or a dolly to move objects if one is available.  Make several small trips instead of carrying one heavy load.  Ask for help when you need it, especially when moving big objects.  Follow these steps when lifting:  Stand with your feet shoulder-width apart.  Get as close to the object as  you can. Do not try to pick up a heavy object that is far from your body.  Use handles or lifting straps if they are available.  Bend at your knees. Squat down, but keep your heels off the floor.  Keep your shoulders pulled back, your chin tucked in, and your back straight.  Lift the object slowly while you tighten the muscles in your legs, abdomen, and buttocks. Keep the object as close to the center of your body as possible.  Follow these steps when putting down a heavy load:  Stand with your feet shoulder-width apart.  Lower the object slowly while you tighten the muscles in  your legs, abdomen, and buttocks. Keep the object as close to the center of your body as possible.  Keep your shoulders pulled back, your chin tucked in, and your back straight.  Bend at your knees. Squat down, but keep your heels off the floor.  Use handles or lifting straps if they are available. Twisting and Reaching  Avoid lifting heavy objects above your waist.  Do not twist at your waist while you are lifting or carrying a load. If you need to turn, move your feet.  Do not bend over without bending at your knees.  Avoid reaching over your head, across a table, or for an object on a high surface. WHAT ARE SOME OTHER TIPS?  Avoid wet floors and icy ground. Keep sidewalks clear of ice to prevent falls.  Do not sleep on a mattress that is too soft or too hard.  Keep items that are used frequently within easy reach.  Put heavier objects on shelves at waist level, and put lighter objects on lower or higher shelves.  Find ways to decrease your stress, such as exercise, massage, or relaxation techniques. Stress can build up in your muscles. Tense muscles are more vulnerable to injury.  Talk with your health care provider if you feel anxious or depressed. These conditions can make back pain worse.  Wear flat heel shoes with cushioned soles.  Avoid sudden movements.  Use both shoulder straps when carrying a backpack.  Do not use any tobacco products, including cigarettes, chewing tobacco, or electronic cigarettes. If you need help quitting, ask your health care provider.   This information is not intended to replace advice given to you by your health care provider. Make sure you discuss any questions you have with your health care provider.   Document Released: 12/07/2004 Document Revised: 03/16/2015 Document Reviewed: 11/03/2014 Elsevier Interactive Patient Education [employer].

## 2016-02-05 DIAGNOSIS — R351 Nocturia: Secondary | ICD-10-CM

## 2016-02-05 DIAGNOSIS — N401 Enlarged prostate with lower urinary tract symptoms: Secondary | ICD-10-CM | POA: Insufficient documentation

## 2016-02-05 DIAGNOSIS — G8929 Other chronic pain: Secondary | ICD-10-CM | POA: Insufficient documentation

## 2016-02-05 DIAGNOSIS — N138 Other obstructive and reflux uropathy: Secondary | ICD-10-CM | POA: Insufficient documentation

## 2016-02-05 DIAGNOSIS — M545 Low back pain, unspecified: Secondary | ICD-10-CM | POA: Insufficient documentation

## 2016-02-05 NOTE — Assessment & Plan Note (Signed)
toleraitng lipitor ,  Started for CT findignsi of atherosclerosis  Lab Results  Component Value Date   CHOL 187 11/03/2015   HDL 54.40 11/03/2015   LDLCALC 118* 11/03/2015   LDLDIRECT 47.0 12/24/2015   TRIG 71.0 11/03/2015   CHOLHDL 3 11/03/2015   Lab Results  Component Value Date   ALT 15 12/24/2015   AST 17 12/24/2015   ALKPHOS 39 12/24/2015   BILITOT 1.7* 12/24/2015

## 2016-02-05 NOTE — Assessment & Plan Note (Signed)
Now on statin therapy.    Referral to Vascular surgery for monitoring, of  aortic aneurysm

## 2016-02-05 NOTE — Assessment & Plan Note (Signed)
Seen on Dec 2016 Ct,  With nocturia x 3.  He has deferred therapy and referral to Urology

## 2016-02-05 NOTE — Assessment & Plan Note (Signed)
No pathologic or worrisome features .  Trial of diclofenac and tylenol.

## 2016-03-08 DIAGNOSIS — I714 Abdominal aortic aneurysm, without rupture: Secondary | ICD-10-CM | POA: Diagnosis not present

## 2016-03-08 DIAGNOSIS — E785 Hyperlipidemia, unspecified: Secondary | ICD-10-CM | POA: Diagnosis not present

## 2016-04-06 DIAGNOSIS — L821 Other seborrheic keratosis: Secondary | ICD-10-CM | POA: Diagnosis not present

## 2016-04-06 DIAGNOSIS — X32XXXA Exposure to sunlight, initial encounter: Secondary | ICD-10-CM | POA: Diagnosis not present

## 2016-04-06 DIAGNOSIS — Z85828 Personal history of other malignant neoplasm of skin: Secondary | ICD-10-CM | POA: Diagnosis not present

## 2016-04-06 DIAGNOSIS — L570 Actinic keratosis: Secondary | ICD-10-CM | POA: Diagnosis not present

## 2016-04-06 DIAGNOSIS — Z08 Encounter for follow-up examination after completed treatment for malignant neoplasm: Secondary | ICD-10-CM | POA: Diagnosis not present

## 2016-04-07 ENCOUNTER — Other Ambulatory Visit: Payer: Self-pay | Admitting: Internal Medicine

## 2016-05-24 ENCOUNTER — Encounter: Payer: Self-pay | Admitting: Internal Medicine

## 2016-05-24 NOTE — Telephone Encounter (Signed)
Please change patient appointment.

## 2016-06-01 ENCOUNTER — Ambulatory Visit: Payer: Medicare Other | Admitting: Internal Medicine

## 2016-08-04 ENCOUNTER — Ambulatory Visit: Payer: Medicare Other | Admitting: Internal Medicine

## 2016-08-04 DIAGNOSIS — H43813 Vitreous degeneration, bilateral: Secondary | ICD-10-CM | POA: Diagnosis not present

## 2016-08-14 ENCOUNTER — Ambulatory Visit (INDEPENDENT_AMBULATORY_CARE_PROVIDER_SITE_OTHER): Payer: Medicare Other | Admitting: Internal Medicine

## 2016-08-14 ENCOUNTER — Encounter: Payer: Self-pay | Admitting: Internal Medicine

## 2016-08-14 VITALS — BP 112/68 | HR 56 | Temp 97.6°F | Resp 12 | Ht 70.0 in | Wt 195.0 lb

## 2016-08-14 DIAGNOSIS — E785 Hyperlipidemia, unspecified: Secondary | ICD-10-CM | POA: Diagnosis not present

## 2016-08-14 DIAGNOSIS — N401 Enlarged prostate with lower urinary tract symptoms: Secondary | ICD-10-CM

## 2016-08-14 DIAGNOSIS — R7301 Impaired fasting glucose: Secondary | ICD-10-CM | POA: Diagnosis not present

## 2016-08-14 DIAGNOSIS — R351 Nocturia: Secondary | ICD-10-CM

## 2016-08-14 DIAGNOSIS — I714 Abdominal aortic aneurysm, without rupture, unspecified: Secondary | ICD-10-CM

## 2016-08-14 DIAGNOSIS — Z23 Encounter for immunization: Secondary | ICD-10-CM

## 2016-08-14 DIAGNOSIS — E663 Overweight: Secondary | ICD-10-CM

## 2016-08-14 DIAGNOSIS — E7800 Pure hypercholesterolemia, unspecified: Secondary | ICD-10-CM

## 2016-08-14 LAB — COMPREHENSIVE METABOLIC PANEL
ALBUMIN: 4.2 g/dL (ref 3.5–5.2)
ALK PHOS: 43 U/L (ref 39–117)
ALT: 21 U/L (ref 0–53)
AST: 20 U/L (ref 0–37)
BUN: 22 mg/dL (ref 6–23)
CO2: 29 mEq/L (ref 19–32)
Calcium: 9.1 mg/dL (ref 8.4–10.5)
Chloride: 107 mEq/L (ref 96–112)
Creatinine, Ser: 0.9 mg/dL (ref 0.40–1.50)
GFR: 87.12 mL/min (ref >60.00)
Glucose, Bld: 88 mg/dL (ref 70–99)
POTASSIUM: 4.4 meq/L (ref 3.5–5.1)
Sodium: 141 mEq/L (ref 135–145)
TOTAL PROTEIN: 6.8 g/dL (ref 6.0–8.3)
Total Bilirubin: 1.6 mg/dL — ABNORMAL HIGH (ref 0.2–1.2)

## 2016-08-14 LAB — LIPID PANEL
CHOLESTEROL: 115 mg/dL (ref 0–200)
HDL: 54.5 mg/dL (ref >39.00)
LDL Cholesterol: 47 mg/dL (ref 0–99)
NonHDL: 60.34
Total CHOL/HDL Ratio: 2
Triglycerides: 67 mg/dL (ref 0.0–149.0)
VLDL: 13.4 mg/dL (ref 0.0–40.0)

## 2016-08-14 LAB — HEMOGLOBIN A1C: HEMOGLOBIN A1C: 5.6 % (ref 4.6–6.5)

## 2016-08-14 MED ORDER — ALENDRONATE SODIUM 70 MG PO TABS: ORAL_TABLET | ORAL | 5 refills | Status: DC

## 2016-08-14 MED ORDER — FAMOTIDINE 40 MG PO TABS: 40.0000 mg | ORAL_TABLET | Freq: Every day | ORAL | 1 refills | Status: DC

## 2016-08-14 NOTE — Progress Notes (Signed)
Pre-visit discussion using our clinic review tool. No additional management support is needed unless otherwise documented below in the visit note.  

## 2016-08-14 NOTE — Progress Notes (Signed)
Subjective:  Patient ID: Jamie Zhang, male    DOB: [birth year]  Age: 75 y.o. MRN: [id number]  CC: The primary encounter diagnosis was Hyperlipidemia LDL goal <100. Diagnoses of Impaired fasting glucose, Encounter for immunization, Overweight (BMI 25.0-29.9), Pure hypercholesterolemia, Abdominal aortic aneurysm (AAA) without rupture ([place]), and BPH associated with nocturia were also pertinent to this visit.  HPI THURSTON SHEETS presents for follow up on IPG and hyperlipidemia.  Last seen in March .  Has  Gained 3 lbs  BP fine  Back spasm every 6 months,  Right hip now bothering  him laterally,  Ok with walking on treadmill .  Used diclofenac for a few days  Uses the gym at [facility],    Has appt for repeat imaging of AAA ., has statin and asa   Need records form Vascular   Fasting today   3 nighttime voids.  , less if he drinks a beer in the afternoon .  Lab Results  Component Value Date   HGBA1C 5.6 08/14/2016        Lab Results  Component Value Date   HGBA1C 5.6 08/14/2016       Outpatient Medications Prior to Visit  Medication Sig Dispense Refill  . atorvastatin (LIPITOR) 20 MG tablet TAKE 1 TABLET EVERY DAY 90 tablet 1  . Calcium Citrate-Vitamin D (CITRACAL + D PO) Take 650 mg by mouth daily. 2 tablets daily    . Cholecalciferol (VITAMIN D3) 10000 UNITS TABS Take 1 tablet by mouth daily.    . diclofenac (VOLTAREN) 75 MG EC tablet Take 1 tablet (75 mg total) by mouth 2 (two) times daily. 60 tablet 2  . fluticasone (FLONASE) 50 MCG/ACT nasal spray PLACE 2 SPRAYS INTO BOTH NOSTRILS DAILY. 48 g 2  . alendronate (FOSAMAX) 70 MG tablet TAKE 1 TABLET ONE TIME A WEEK. TAKE WITH A FULL GLASS OF WATER ON AN EMPTY STOMACH. 12 tablet 5  . famotidine (PEPCID) 40 MG tablet TAKE 1 TABLET EVERY DAY 90 tablet 1  . Flax Oil-Fish Oil-Borage Oil ([employer] OMEGA-3) CAPS Take per instructions (Patient not taking: Reported on 08/14/2016) 30 capsule 6  . magnesium oxide (MAG-OX) 400 MG tablet Take  400 mg by mouth daily.     No facility-administered medications prior to visit.     Review of Systems;  Patient denies headache, fevers, malaise, unintentional weight loss, skin rash, eye pain, sinus congestion and sinus pain, sore throat, dysphagia,  hemoptysis , cough, dyspnea, wheezing, chest pain, palpitations, orthopnea, edema, abdominal pain, nausea, melena, diarrhea, constipation, flank pain, dysuria, hematuria, urinary  Frequency, nocturia, numbness, tingling, seizures,  Focal weakness, Loss of consciousness,  Tremor, insomnia, depression, anxiety, and suicidal ideation.      Objective:  BP 112/68   Pulse (!) 56   Temp 97.6 F (36.4 C) (Oral)   Resp 12   Ht 5\' 10"  (1.778 m)   Wt 195 lb (88.5 kg)   SpO2 98%   BMI 27.98 kg/m   BP Readings from Last 3 Encounters:  08/14/16 112/68  02/02/16 98/60  12/03/15 104/60    Wt Readings from Last 3 Encounters:  08/14/16 195 lb (88.5 kg)  02/02/16 192 lb 8 oz (87.3 kg)  12/03/15 195 lb 8 oz (88.7 kg)    General appearance: alert, cooperative and appears stated age Ears: normal TM's and external ear canals both ears Throat: lips, mucosa, and tongue normal; teeth and gums normal Neck: no adenopathy, no carotid bruit, supple, symmetrical, trachea midline  and thyroid not enlarged, symmetric, no tenderness/mass/nodules Back: symmetric, no curvature. ROM normal. No CVA tenderness. Lungs: clear to auscultation bilaterally Heart: regular rate and rhythm, S1, S2 normal, no murmur, click, rub or gallop Abdomen: soft, non-tender; bowel sounds normal; no masses,  no organomegaly Pulses: 2+ and symmetric Skin: Skin color, texture, turgor normal. No rashes or lesions Lymph nodes: Cervical, supraclavicular, and axillary nodes normal.  Lab Results  Component Value Date   HGBA1C 5.6 08/14/2016   HGBA1C 5.6 11/03/2015   HGBA1C 5.6 05/05/2015    Lab Results  Component Value Date   CREATININE 0.90 08/14/2016   CREATININE 1.04  12/24/2015   CREATININE 0.95 11/03/2015    Lab Results  Component Value Date   WBC 6.5 11/03/2015   HGB 15.6 11/03/2015   HCT 47.0 11/03/2015   PLT 196.0 11/03/2015   GLUCOSE 88 08/14/2016   CHOL 115 08/14/2016   TRIG 67.0 08/14/2016   HDL 54.50 08/14/2016   LDLDIRECT 47.0 12/24/2015   LDLCALC 47 08/14/2016   ALT 21 08/14/2016   AST 20 08/14/2016   NA 141 08/14/2016   K 4.4 08/14/2016   CL 107 08/14/2016   CREATININE 0.90 08/14/2016   BUN 22 08/14/2016   CO2 29 08/14/2016   TSH 1.54 11/03/2015   PSA 0.86 11/03/2015   HGBA1C 5.6 08/14/2016    Ct Angio Abd/pel W/ And/or W/o  Result Date: 11/11/2015 CLINICAL DATA:  Evaluate abdominal aortic aneurysm. EXAM: CT ANGIOGRAPHY ABDOMEN AND PELVIS WITH CONTRAST TECHNIQUE: Multidetector CT imaging of the abdomen and pelvis was performed using the standard protocol during bolus administration of intravenous contrast. Multiplanar reconstructed images including MIPs were obtained and reviewed to evaluate the vascular anatomy. CONTRAST:  15mL OMNIPAQUE IOHEXOL 350 MG/ML SOLN COMPARISON:  CT abdomen pelvis - 09/23/2014; 03/13/2014 FINDINGS: Vascular Findings: Abdominal aorta: Large amount of mixed calcified and noncalcified slightly irregular atherosclerotic plaque throughout the abdominal aorta, not resulting in a hemodynamically significant stenosis. There are several tandem penetrating atherosclerotic ulcers involving primarily the right lateral aspect of the abdominal wall with dominant ulcer measuring approximately 2.2 by 0.8 cm (best seen on coronal image 79, series 9). A nonocclusive intimal web is seen within distal aspect of the abdominal aorta (image 85, series 4). No abdominal aortic dissection or periaortic stranding. Minimal increase in size of infrarenal abdominal aortic aneurysm now measuring approximately 3.3 x 3.4 x 3.5 cm (as measured in greatest oblique axial (image 86, series 4), coronal (image 79, series 9) and sagittal (image  100, series 10) dimensions respectively, previously, 3.4 x 3.2 x 3.2 cm. The aneurysm begins at the level of the take-off of the most caudal right renal artery and extends to involve the bilateral common iliac arteries. Celiac artery: Widely patent without hemodynamically significant narrowing. Conventional branching pattern. SMA: There is a minimal amount of eccentric mixed calcified and noncalcified atherosclerotic plaque involving the origin proximal aspect of the SMA, not resulting in hemodynamically significant stenosis. A replaced right hepatic artery arises from the proximal SMA. The distal tributaries the SMA are widely patent without discrete intraluminal filling defect to suggest distal embolism. Right Renal artery: Duplicated ; co-dominant. Both right-sided renal arteries are widely patent without hemodynamically significant narrowing. No vessel irregularity to suggest FMD. Left Renal artery: Solitary; widely patent without hemodynamically significant narrowing. No vessel irregularity to suggest FMD. IMA: Widely patent without hemodynamically significant narrowing. Right-sided pelvic vasculature: There is a moderate amount of eccentric mixed calcified and noncalcified atherosclerotic plaque within the right common and  external iliac arteries, not resulting in hemodynamically significant stenosis. Note is made of a intimal web projecting into the lumen of the right common iliac artery (image 106, series 4), not resulting in hemodynamically significant stenosis. The right common iliac artery is mildly ectatic measuring 1.2 cm in diameter (image 110, series 4), minimally increased in size since the 03/2014 examination, previously, 1.8 cm. The right internal iliac artery is diseased though patent and of normal caliber. Left-sided pelvic vasculature: There is a moderate amount of eccentric mixed calcified and noncalcified atherosclerotic plaque throughout the left common iliac artery, not resulting in  hemodynamically significant stenosis. There is a intimal web involving the left common iliac artery (axial image 109, series 4, coronal image 82, series 9), not resulting in hemodynamically significant stenosis. The left common iliac artery is mildly ectatic, measuring 1.9 cm in diameter, previously, 1.7 cm. The left internal iliac artery is disease though patent and of normal caliber. Review of the MIP images confirms the above findings. -------------------------------------------------------------------------------- Nonvascular Findings: Normal hepatic contour. No discrete hepatic lesions. Normal appearance of the gallbladder given degree distention. No radiopaque gallstones. No intra extrahepatic bili duct dilatation. No ascites. There is symmetric enhancement and excretion of the bilateral kidneys. No definite renal stones in this postcontrast examination. No discrete renal lesions. No urinary obstruction or perinephric stranding. Normal appearance of the bilateral adrenal glands, pancreas and spleen. Moderate to large size hiatal hernia. Scattered colonic diverticulosis without evidence of diverticulitis. The bowel is otherwise normal in course and caliber without wall thickening. Normal appearance of the terminal ileum and retrocecal appendix. No pneumoperitoneum, pneumatosis or portal venous gas. No bulky retroperitoneal, mesenteric, pelvic or inguinal lymphadenopathy. The prostate is borderline enlarged with mass effect upon the undersurface of the urinary bladder. There is mild thickening of the urinary bladder wall, potentially accentuated due to underdistention. Several phleboliths are seen within the lower pelvis bilaterally, right greater than left. No free fluid in the pelvic cul-de-sac. Limited visualization of the lower thorax is negative for focal airspace opacity or pleural effusion. Normal heart size.  No pericardial effusion. No acute or aggressive osseous abnormalities. Mild-to-moderate  multilevel lumbar spine DDD. Small mesenteric fat containing periumbilical hernia. Post left-sided inguinal hernia repair with tiny residual left-sided mesenteric fat containing inguinal hernia. IMPRESSION: Vascular Impression: 1. Minimal enlargement of infrarenal abdominal aortic aneurysm, currently measuring 3.3 x 3.4 x 3.5 cm, previously, 3.4 x 3.2 x 3.2 cm. 2. There is a large amount of eccentric mixed calcified and noncalcified atherosclerotic plaque throughout the abdominal aorta extending to involve the bilateral common iliac arteries with associated penetrating atherosclerotic ulcers and intimal webs, not resulting in a hemodynamically significant stenosis. No abdominal aortic dissection or periaortic stranding. 3. 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 4. Mild ectasia of the bilateral common iliac arteries, the right measuring 2.1 cm, the left measuring 1.9 cm. Nonvascular Impression: 1. Colonic diverticulosis without evidence of diverticulitis. 2. Moderate to large-sized hiatal hernia. 3. Prostate is borderline enlarged with mass effect upon the undersurface of the urinary bladder. Electronically Signed   By: Sandi Mariscal M.D.   On: 11/11/2015 15:47    Assessment & Plan:   Problem List Items Addressed This Visit    HLD (hyperlipidemia)    toleraitng lipitor ,  Started for CT findings of atherosclerosis  Lab Results  Component Value Date   CHOL 115 08/14/2016   HDL 54.50 08/14/2016   LDLCALC 47 08/14/2016   LDLDIRECT 47.0 12/24/2015   TRIG 67.0 08/14/2016  CHOLHDL 2 08/14/2016   Lab Results  Component Value Date   ALT 21 08/14/2016   AST 20 08/14/2016   ALKPHOS 43 08/14/2016   BILITOT 1.6 (H) 08/14/2016         Relevant Medications   aspirin EC 81 MG tablet   Abdominal aortic aneurysm ([facility])    Referred l to [facility] for 4 cm AA noted on CT scan initially in 2015      Relevant Medications   aspirin EC 81 MG tablet   Impaired fasting glucose    He  has lowered his a1c to a normal range by losing weight and reducing the sugar in his diet.   Lab Results  Component Value Date   HGBA1C 5.6 08/14/2016         Relevant Orders   Hemoglobin A1c (Completed)   Comprehensive metabolic panel (Completed)   Overweight (BMI 25.0-29.9)    I have addressed  BMI and recommended wt loss of 10% of body weigh over the next 6 months using a low glycemic index diet and regular exercise a minimum of 5 days per week.        BPH associated with nocturia    Seen on Dec 2016 Ct,  With nocturia x 3.  He has deferred therapy and referral to Urology        Other Visit Diagnoses    Hyperlipidemia LDL goal <100    -  Primary   Relevant Medications   aspirin EC 81 MG tablet   Other Relevant Orders   Lipid panel (Completed)   Encounter for immunization       Relevant Orders   Flu vaccine HIGH DOSE PF (Completed)     A total of 25 minutes of face to face time was spent with patient more than half of which was spent in counselling about the above mentioned conditions  and coordination of care   I have changed Mr. Bischoff famotidine. I am also having him maintain his [employer] OMEGA-3, Calcium Citrate-Vitamin D (CITRACAL + D PO), Vitamin D3, magnesium oxide, fluticasone, diclofenac, atorvastatin, alendronate, and aspirin EC.  Meds ordered this encounter  Medications  . alendronate (FOSAMAX) 70 MG tablet    Sig: TAKE 1 TABLET ONE TIME A WEEK. TAKE WITH A FULL GLASS OF WATER ON AN EMPTY STOMACH.    Dispense:  12 tablet    Refill:  5  . famotidine (PEPCID) 40 MG tablet    Sig: Take 1 tablet (40 mg total) by mouth daily.    Dispense:  90 tablet    Refill:  1  . aspirin EC 81 MG tablet    Sig: Take 81 mg by mouth daily.    Medications Discontinued During This Encounter  Medication Reason  . alendronate (FOSAMAX) 70 MG tablet Reorder  . famotidine (PEPCID) 40 MG tablet Reorder    Follow-up: Return in about 6 months (around 02/12/2017) for  wellness/CPe.   Crecencio Mc, MD

## 2016-08-14 NOTE — Patient Instructions (Signed)
Ask the trainers at [facility] to help you strengthen your "core", this may help prevent back spasms   Beer acts as a diuretic, so drinking one daily at lunch gives your kidneys time to process the beer and make more urine BEFORE you go to bed.

## 2016-08-15 IMAGING — CT CT ABD-PELV W/O CM
2 of 4 series · 16 of 46 positions shown, 18 images · non-contrast
Comparison: CT abdomen and pelvis of 03/13/2010

CLINICAL DATA: History of kidney stones, also history of infrarenal
abdominal aortic aneurysm, followup

EXAM:
CT ABDOMEN AND PELVIS WITHOUT CONTRAST
TECHNIQUE: Multidetector CT imaging of the abdomen and pelvis was performed
following the standard protocol without IV contrast.

[Series 2: routine abd pel without · axial · non-contrast · 0.77mm/px · z∈[-968,-544]mm · 13 of 93 slices shown, 15 images]
[im 4/93  soft-tissue]
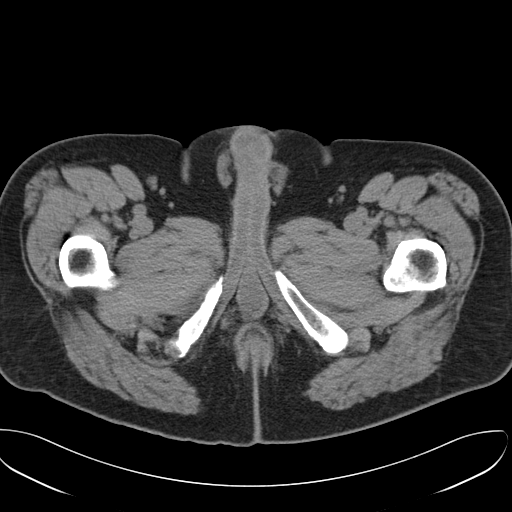
[im 4/93  bone]
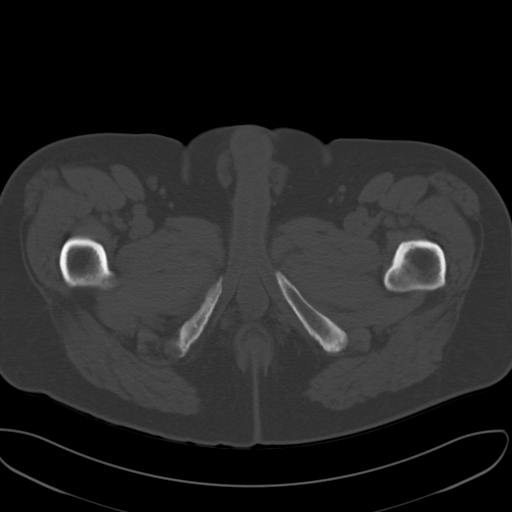
[im 12/93  soft-tissue]
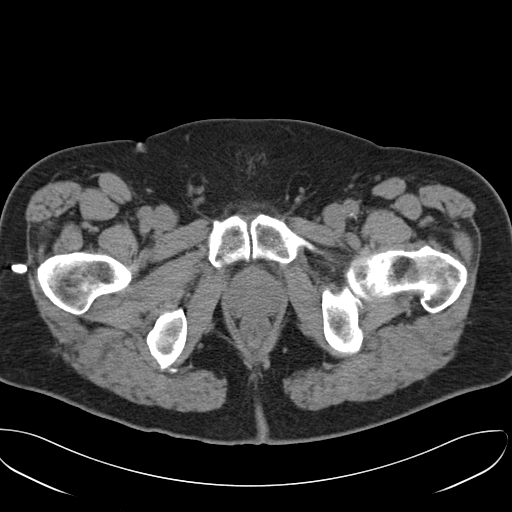
[im 20/93  soft-tissue]
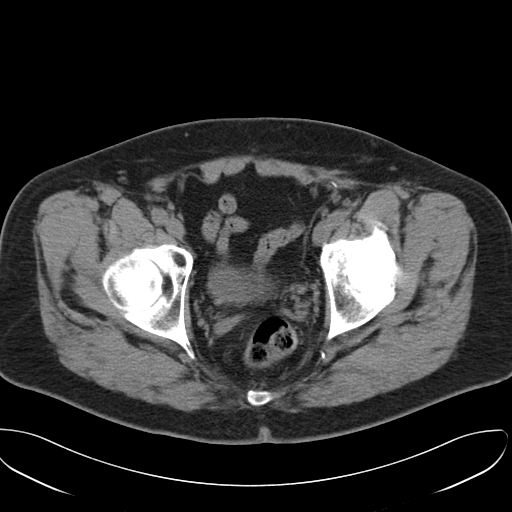
[im 27/93  soft-tissue]
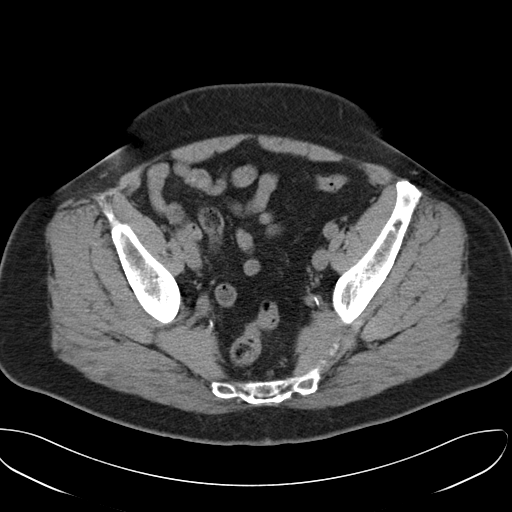
[im 31/93  soft-tissue]
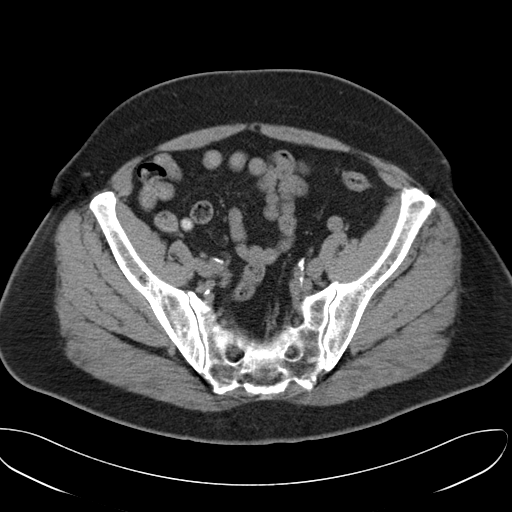
[im 39/93  soft-tissue]
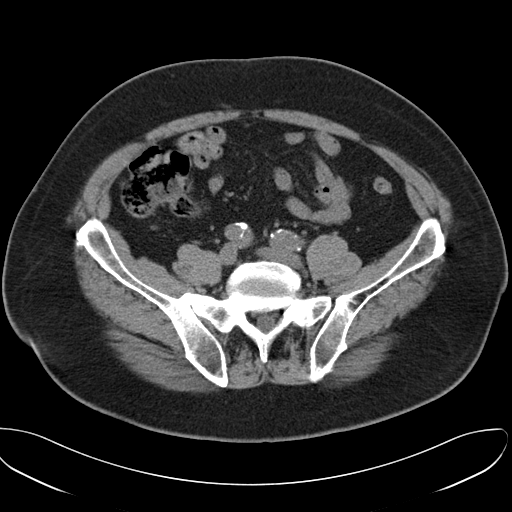
[im 47/93  soft-tissue]
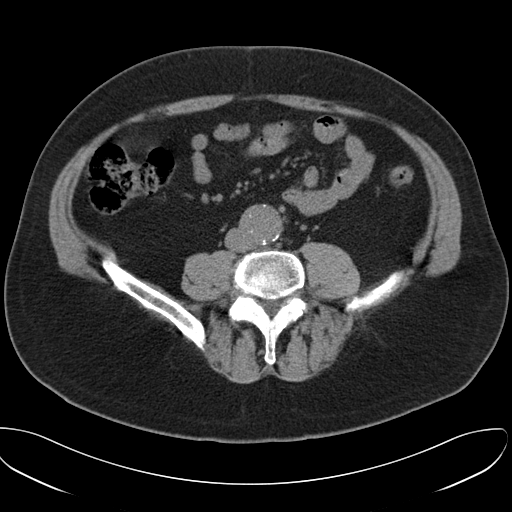
[im 54/93  soft-tissue]
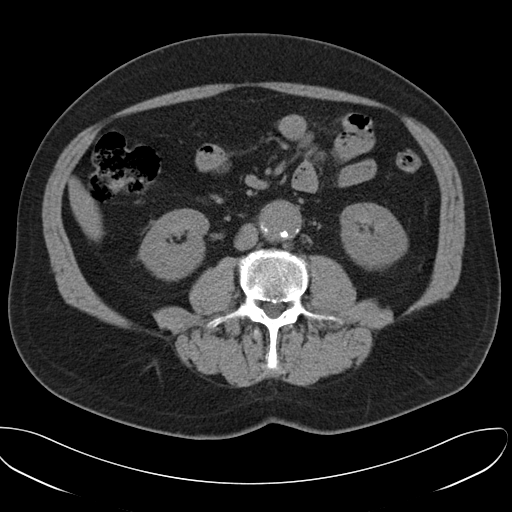
[im 62/93  soft-tissue]
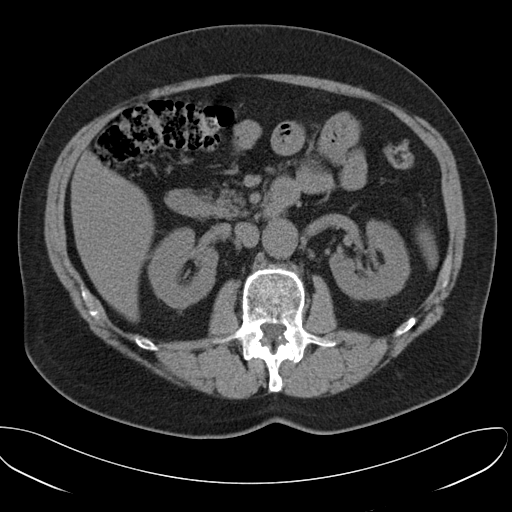
[im 62/93  bone]
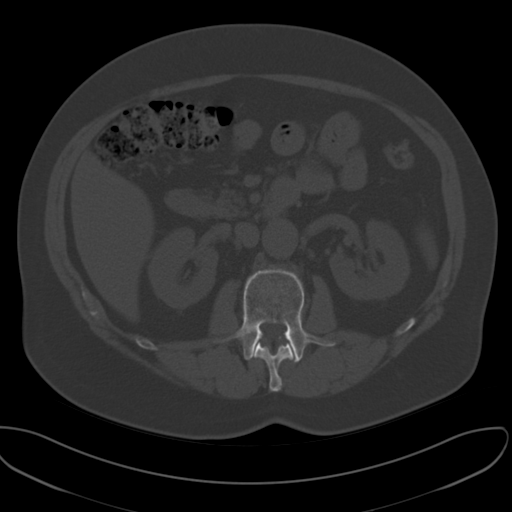
[im 66/93  soft-tissue]
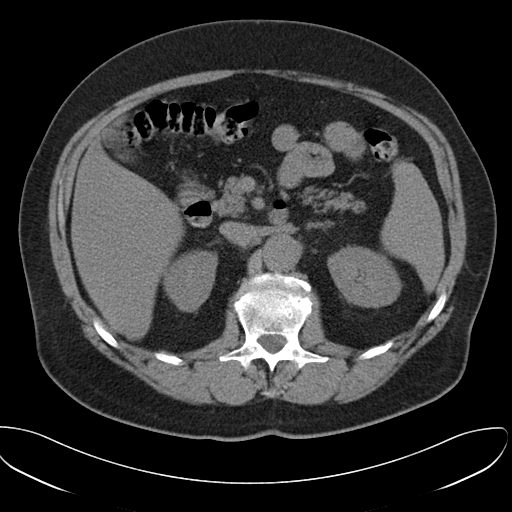
[im 73/93  soft-tissue]
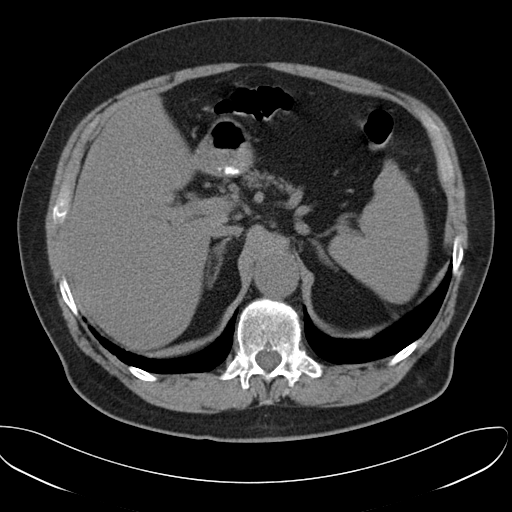
[im 81/93  soft-tissue]
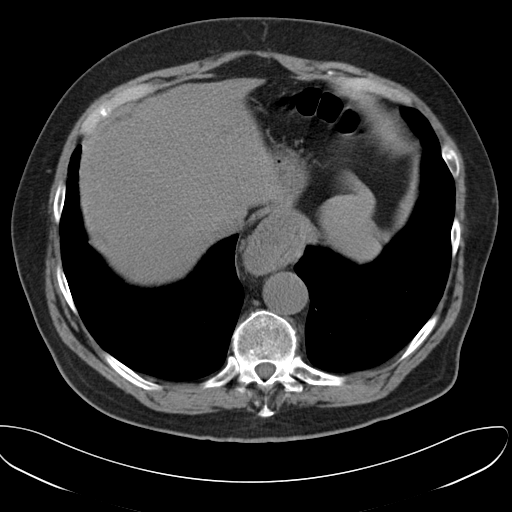
[im 89/93  soft-tissue]
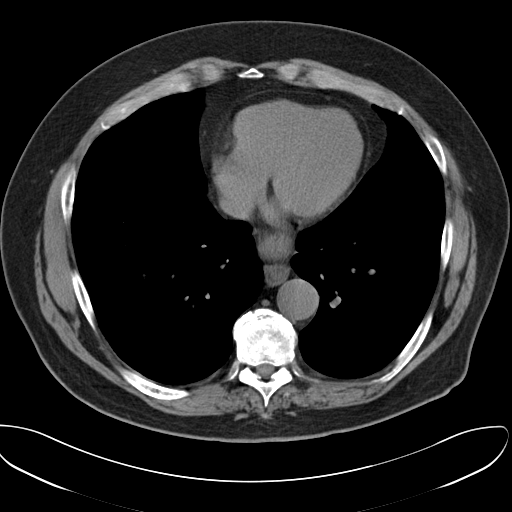

[Series 5: cor routine abd pel wo · coronal · 0.78mm/px · 3 of 156 slices shown]
[im 52/156  soft-tissue]
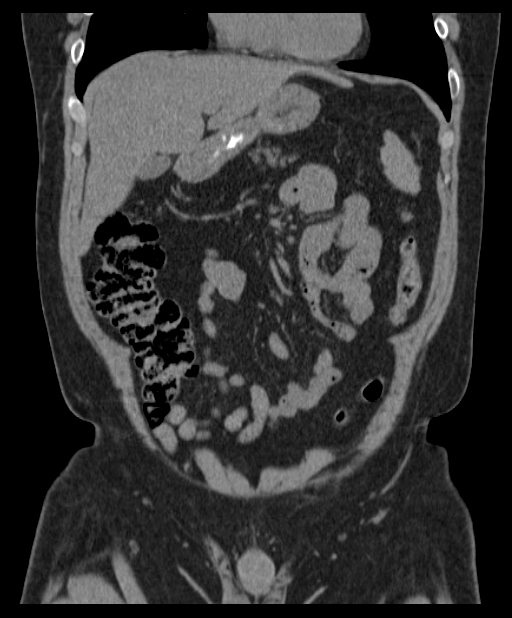
[im 69/156  soft-tissue]
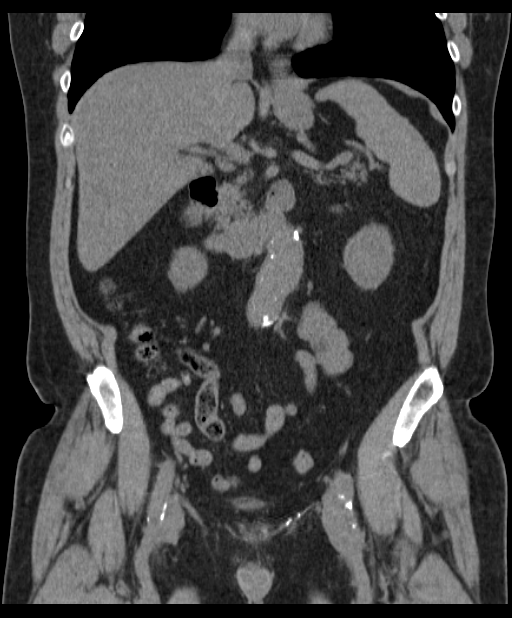
[im 87/156  soft-tissue]
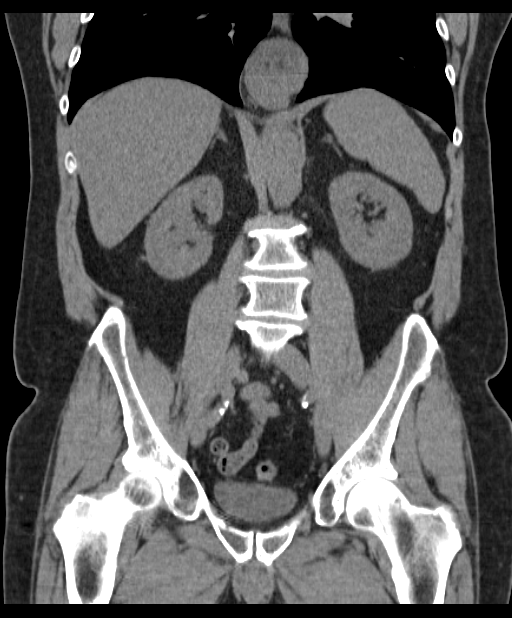

[16 of 46 positions shown; findings below may reference images not displayed]

FINDINGS: The lung bases are clear other than a tiny 2 mm peripheral nodule in
the right lower lobe which is stable. There is a moderate size
hiatal hernia present. The liver is unremarkable in the unenhanced
state. No calcified gallstones are seen. The pancreas is normal in
size in the pancreatic duct is not dilated. The adrenal glands and
spleen are unremarkable. The stomach is decompressed. No renal
calculi are seen and there is no evidence of hydronephrosis.

The previously noted infrarenal abdominal aortic aneurysm appears
relatively stable measuring 3.8 x 3.3 cm compared to 3.9 x 3.7 cm
previously. Followup recommendations are given in the impression.

No adenopathy is seen. The urinary bladder is decompressed but no
obvious abnormality is seen. The prostate is minimally prominent. No
fluid or mass is seen within the pelvis. There are a few scattered
rectosigmoid and descending colon diverticula present. There is
feces throughout the colon. The terminal ileum is unremarkable. The
appendix is unchanged, containing higher attenuation material
possibly nonobstructing appendicoliths. No evidence of appendicitis
is currently seen. The lumbar vertebrae are in normal alignment with
no acute abnormality.
IMPRESSION: 1. Stable infrarenal abdominal aortic aneurysm of 3.8 x 3.3 cm.
Ectatic to mildly aneurysmal ascending thoracic aorta. Recommend
annual imaging followup by CTA or MRA. This recommendation follows
6020 ACCF/AHA/AATS/ACR/ASA/SCA/FUNISWA/LERIEL/LAVELL/BILLIOT Guidelines for the
Diagnosis and Management of Patients With Thoracic Aortic Disease.
Circulation.

6020; 121: [id number]

2.  Moderate size hiatal hernia.

3.  Probable nonobstructing appendicolith.

## 2016-08-15 NOTE — Assessment & Plan Note (Signed)
toleraitng lipitor ,  Started for CT findings of atherosclerosis  Lab Results  Component Value Date   CHOL 115 08/14/2016   HDL 54.50 08/14/2016   LDLCALC 47 08/14/2016   LDLDIRECT 47.0 12/24/2015   TRIG 67.0 08/14/2016   CHOLHDL 2 08/14/2016   Lab Results  Component Value Date   ALT 21 08/14/2016   AST 20 08/14/2016   ALKPHOS 43 08/14/2016   BILITOT 1.6 (H) 08/14/2016

## 2016-08-15 NOTE — Assessment & Plan Note (Signed)
He has lowered his a1c to a normal range by losing weight and reducing the sugar in his diet.   Lab Results  Component Value Date   HGBA1C 5.6 08/14/2016

## 2016-08-15 NOTE — Assessment & Plan Note (Signed)
Referred l to [facility] for 4 cm AA noted on CT scan initially in 2015

## 2016-08-15 NOTE — Assessment & Plan Note (Signed)
I have addressed  BMI and recommended wt loss of 10% of body weigh over the next 6 months using a low glycemic index diet and regular exercise a minimum of 5 days per week.   

## 2016-08-15 NOTE — Assessment & Plan Note (Signed)
Seen on Dec 2016 Ct,  With nocturia x 3.  He has deferred therapy and referral to Urology

## 2016-08-16 ENCOUNTER — Encounter: Payer: Self-pay | Admitting: Internal Medicine

## 2016-08-28 ENCOUNTER — Other Ambulatory Visit: Payer: Self-pay | Admitting: Internal Medicine

## 2016-09-08 ENCOUNTER — Ambulatory Visit (INDEPENDENT_AMBULATORY_CARE_PROVIDER_SITE_OTHER): Payer: Self-pay | Admitting: Vascular Surgery

## 2016-09-08 ENCOUNTER — Other Ambulatory Visit (INDEPENDENT_AMBULATORY_CARE_PROVIDER_SITE_OTHER): Payer: Self-pay

## 2016-09-13 ENCOUNTER — Telehealth: Payer: Self-pay | Admitting: Internal Medicine

## 2016-09-13 DIAGNOSIS — G8929 Other chronic pain: Secondary | ICD-10-CM

## 2016-09-13 DIAGNOSIS — M545 Low back pain: Principal | ICD-10-CM

## 2016-09-13 NOTE — Telephone Encounter (Signed)
Erin from [facility] called and wanted to know if pt could get a referral to see them there, for back strengthening.   Phone [phone] Fax [phone]

## 2016-09-13 NOTE — Telephone Encounter (Signed)
Last OV 08/14/16 ok  Can we give referral.

## 2016-09-13 NOTE — Telephone Encounter (Signed)
orddered

## 2016-09-14 NOTE — Telephone Encounter (Signed)
faxed

## 2016-09-20 DIAGNOSIS — M545 Low back pain: Secondary | ICD-10-CM | POA: Diagnosis not present

## 2016-09-22 DIAGNOSIS — M545 Low back pain: Secondary | ICD-10-CM | POA: Diagnosis not present

## 2016-09-25 DIAGNOSIS — M545 Low back pain: Secondary | ICD-10-CM | POA: Diagnosis not present

## 2016-09-27 ENCOUNTER — Other Ambulatory Visit (INDEPENDENT_AMBULATORY_CARE_PROVIDER_SITE_OTHER): Payer: Self-pay | Admitting: Vascular Surgery

## 2016-09-27 DIAGNOSIS — I714 Abdominal aortic aneurysm, without rupture, unspecified: Secondary | ICD-10-CM

## 2016-09-28 DIAGNOSIS — M545 Low back pain: Secondary | ICD-10-CM | POA: Diagnosis not present

## 2016-10-03 ENCOUNTER — Ambulatory Visit (INDEPENDENT_AMBULATORY_CARE_PROVIDER_SITE_OTHER): Payer: Medicare Other | Admitting: Vascular Surgery

## 2016-10-03 ENCOUNTER — Encounter (INDEPENDENT_AMBULATORY_CARE_PROVIDER_SITE_OTHER): Payer: Self-pay | Admitting: Vascular Surgery

## 2016-10-03 ENCOUNTER — Ambulatory Visit (INDEPENDENT_AMBULATORY_CARE_PROVIDER_SITE_OTHER): Payer: Medicare Other

## 2016-10-03 VITALS — BP 132/79 | HR 51 | Resp 16 | Ht 68.0 in | Wt 196.0 lb

## 2016-10-03 DIAGNOSIS — I714 Abdominal aortic aneurysm, without rupture, unspecified: Secondary | ICD-10-CM

## 2016-10-03 DIAGNOSIS — E785 Hyperlipidemia, unspecified: Secondary | ICD-10-CM | POA: Diagnosis not present

## 2016-10-03 NOTE — Assessment & Plan Note (Signed)
The patient has a 3.5 centimeter abdominal aortic aneurysm. This is below the threshold size for prophylactic repair. I have discussed the natural history and pathophysiology of abdominal aortic aneurysms. I have discussed the risks and benefits of repair versus observation, and why continued surveillance and medical management is appropriate for aneurysms at this size. I discussed it is important to continue surveillance of these aneurysms and we will plan to see the patient back in 12 months with noninvasive studies to reimage the aneurysm. They will contact our office with any problems in the interim.

## 2016-10-03 NOTE — Progress Notes (Signed)
MRN : [id number]  Jamie Zhang is a 75 y.o. (05-19-1941) male who presents with chief complaint of  Chief Complaint  Patient presents with  . Re-evaluation    6 month AAA ultrasound follow up  .  History of Present Illness: Patient returns today in follow up of His abdominal aortic aneurysm. He denies any symptoms of his aneurysm. Specifically, the patient denies new back or abdominal pain, or signs of peripheral embolization. His duplex today demonstrates a stable 3.5 cm infrarenal abdominal aortic aneurysm.  Current Outpatient Prescriptions  Medication Sig Dispense Refill  . alendronate (FOSAMAX) 70 MG tablet TAKE 1 TABLET ONE TIME A WEEK. TAKE WITH A FULL GLASS OF WATER ON AN EMPTY STOMACH. 12 tablet 5  . aspirin EC 81 MG tablet Take 81 mg by mouth daily.    [name] atorvastatin (LIPITOR) 20 MG tablet TAKE 1 TABLET EVERY DAY 90 tablet 1  . Calcium Citrate-Vitamin D (CITRACAL + D PO) Take 650 mg by mouth daily. 2 tablets daily    . Cholecalciferol (VITAMIN D3) 10000 UNITS TABS Take 1 tablet by mouth daily.    . diclofenac (VOLTAREN) 75 MG EC tablet Take 1 tablet (75 mg total) by mouth 2 (two) times daily. 60 tablet 2  . famotidine (PEPCID) 40 MG tablet Take 1 tablet (40 mg total) by mouth daily. 90 tablet 1  . Flax Oil-Fish Oil-Borage Oil ([employer] OMEGA-3) CAPS Take per instructions 30 capsule 6  . fluticasone (FLONASE) 50 MCG/ACT nasal spray PLACE 2 SPRAYS INTO BOTH NOSTRILS DAILY. 48 g 2  . magnesium oxide (MAG-OX) 400 MG tablet Take 400 mg by mouth daily.     No current facility-administered medications for this visit.     Past Medical History:  Diagnosis Date  . Cataract 01/02/2011   Bilateral - s/p excision  . Colon polyps 2000   Hyperplastic - Repeat colonoscopy 2015  . GERD (gastroesophageal reflux disease)    Pantoprazole  . Hemorrhoids 2006  . HLD (hyperlipidemia)   . Iron deficiency anemia 03/20/2006   Donating too much blood  . Migraine    No longer has problems  .  Osteopenia    on Fosamax  . RLS (restless legs syndrome) 03/20/2006   2/2 iron deficiency  . Schatzki's ring   . Skin cancer 01/04/2012   Basal cell nose, squamous cell scalp - Dr. Ula Lingo    Past Surgical History:  Procedure Laterality Date  . BREAST BIOPSY Left    benign  . Cataract Surgery Bilateral   . INGUINAL HERNIA REPAIR Left   . ROTATOR CUFF REPAIR Left     Social History Social History  Substance Use Topics  . Smoking status: Former Smoker    Packs/day: 2.00    Years: 21.00    Types: Cigarettes  . Smokeless tobacco: Not on file  . Alcohol use No     Comment: Occasional wine glass     Family History Family History  Problem Relation Age of Onset  . Alcohol abuse Mother   . Cancer Father 50    Throat Cancer - died  . Alcohol abuse Father   . Cancer Sister 55    Breast Cancer     No Known Allergies   REVIEW OF SYSTEMS (Negative unless checked)  Constitutional: [] Weight loss  [] Fever  [] Chills Cardiac: [] Chest pain   [] Chest pressure   [] Palpitations   [] Shortness of breath when laying flat   [] Shortness of breath at rest   [] Shortness of  breath with exertion. Vascular:  [] Pain in legs with walking   [] Pain in legs at rest   [] Pain in legs when laying flat   [] Claudication   [] Pain in feet when walking  [] Pain in feet at rest  [] Pain in feet when laying flat   [] History of DVT   [] Phlebitis   [] Swelling in legs   [] Varicose veins   [] Non-healing ulcers Pulmonary:   [] Uses home oxygen   [] Productive cough   [] Hemoptysis   [] Wheeze  [] COPD   [] Asthma Neurologic:  [] Dizziness  [] Blackouts   [] Seizures   [] History of stroke   [] History of TIA  [] Aphasia   [] Temporary blindness   [] Dysphagia   [] Weakness or numbness in arms   [] Weakness or numbness in legs Musculoskeletal:  [] Arthritis   [] Joint swelling   [] Joint pain   [x] Low back pain Hematologic:  [] Easy bruising  [] Easy bleeding   [] Hypercoagulable state   [] Anemic   Gastrointestinal:  [] Blood in stool    [] Vomiting blood  [] Gastroesophageal reflux/heartburn   [] Abdominal pain Genitourinary:  [] Chronic kidney disease   [] Difficult urination  [] Frequent urination  [] Burning with urination   [] Hematuria Skin:  [] Rashes   [] Ulcers   [] Wounds Psychological:  [] History of anxiety   []  History of major depression.  Physical Examination  BP 132/79 (BP Location: Right Arm)   Pulse (!) 51   Resp 16   Ht 5\' 8"  (1.727 m)   Wt 196 lb (88.9 kg)   BMI 29.80 kg/m  Gen:  WD/WN, NAD Head: [place]/AT, No temporalis wasting. Ear/Nose/Throat: Hearing grossly intact, nares w/o erythema or drainage, trachea midline Eyes: Conjunctiva clear. Sclera non-icteric Neck: Supple.  No JVD.  Pulmonary:  Good air movement, no use of accessory muscles.  Cardiac: RRR, normal S1, S2 Vascular:  Vessel Right Left  Radial Palpable Palpable  Ulnar Palpable Palpable  Brachial Palpable Palpable  Carotid Palpable, without bruit Palpable, without bruit  Aorta Not palpable N/A  Femoral Palpable Palpable  Popliteal Palpable Palpable  PT Palpable Palpable  DP Palpable Palpable   Gastrointestinal: soft, non-tender/non-distended. No guarding/reflex.  Musculoskeletal: M/S 5/5 throughout.  No deformity or atrophy.  Neurologic: Sensation grossly intact in extremities.  Symmetrical.  Speech is fluent.  Psychiatric: Judgment intact, Mood & affect appropriate for pt's clinical situation. Dermatologic: No rashes or ulcers noted.  No cellulitis or open wounds. Lymph : No Cervical, Axillary, or Inguinal lymphadenopathy.      Labs Recent Results (from the past 2160 hour(s))  Hemoglobin A1c     Status: None   Collection Time: 08/14/16  9:51 AM  Result Value Ref Range   Hgb A1c MFr Bld 5.6 4.6 - 6.5 %    Comment: Glycemic Control Guidelines for People with Diabetes:Non Diabetic:  <6%Goal of Therapy: <7%Additional Action Suggested:  >8%   Lipid panel     Status: None   Collection Time: 08/14/16  9:51 AM  Result Value Ref Range    Cholesterol 115 0 - 200 mg/dL    Comment: ATP III Classification       Desirable:  < 200 mg/dL               Borderline High:  200 - 239 mg/dL          High:  > = 240 mg/dL   Triglycerides 67.0 0.0 - 149.0 mg/dL    Comment: Normal:  <150 mg/dLBorderline High:  150 - 199 mg/dL   HDL 54.50 >39.00 mg/dL   VLDL 13.4 0.0 -  40.0 mg/dL   LDL Cholesterol 47 0 - 99 mg/dL   Total CHOL/HDL Ratio 2     Comment:                Men          Women1/2 Average Risk     3.4          3.3Average Risk          5.0          4.42X Average Risk          9.6          7.13X Average Risk          15.0          11.0                       NonHDL 60.34     Comment: NOTE:  Non-HDL goal should be 30 mg/dL higher than patient's LDL goal (i.e. LDL goal of < 70 mg/dL, would have non-HDL goal of < 100 mg/dL)  Comprehensive metabolic panel     Status: Abnormal   Collection Time: 08/14/16  9:51 AM  Result Value Ref Range   Sodium 141 135 - 145 mEq/L   Potassium 4.4 3.5 - 5.1 mEq/L   Chloride 107 96 - 112 mEq/L   CO2 29 19 - 32 mEq/L   Glucose, Bld 88 70 - 99 mg/dL   BUN 22 6 - 23 mg/dL   Creatinine, Ser 0.90 0.40 - 1.50 mg/dL   Total Bilirubin 1.6 (H) 0.2 - 1.2 mg/dL   Alkaline Phosphatase 43 39 - 117 U/L   AST 20 0 - 37 U/L   ALT 21 0 - 53 U/L   Total Protein 6.8 6.0 - 8.3 g/dL   Albumin 4.2 3.5 - 5.2 g/dL   Calcium 9.1 8.4 - 10.5 mg/dL   GFR 87.12 >60.00 mL/min    Radiology No results found.   Assessment/Plan  HLD (hyperlipidemia) lipid control important in reducing the progression of atherosclerotic disease. Continue statin therapy   Abdominal aortic aneurysm The patient has a 3.5 centimeter abdominal aortic aneurysm. This is below the threshold size for prophylactic repair. I have discussed the natural history and pathophysiology of abdominal aortic aneurysms. I have discussed the risks and benefits of repair versus observation, and why continued surveillance and medical management is appropriate for  aneurysms at this size. I discussed it is important to continue surveillance of these aneurysms and we will plan to see the patient back in 12 months with noninvasive studies to reimage the aneurysm. They will contact our office with any problems in the interim.    Leotis Pain, MD  10/03/2016 10:01 AM    This note was created with Dragon medical transcription system.  Any errors from dictation are purely unintentional

## 2016-10-03 NOTE — Assessment & Plan Note (Signed)
lipid control important in reducing the progression of atherosclerotic disease. Continue statin therapy  

## 2016-10-03 NOTE — Patient Instructions (Signed)
Abdominal Aortic Aneurysm Blood pumps away from the heart through tubes (blood vessels) called arteries. Aneurysms are weak or damaged places in the wall of an artery. It bulges out like a balloon. An abdominal aortic aneurysm happens in the main artery of the body (aorta). It can burst or tear, causing bleeding inside the body. This is an emergency. It needs treatment right away. What are the causes? The exact cause is unknown. Things that could cause this problem include:  Fat and other substances building up in the lining of a tube.  Swelling of the walls of a blood vessel.  Certain tissue diseases.  Belly (abdominal) trauma.  An infection in the main artery of the body.  What increases the risk? There are things that make it more likely for you to have an aneurysm. These include:  Being over the age of 76 years old.  Having high blood pressure (hypertension).  Being a male.  Being white.  Being very overweight (obese).  Having a family history of aneurysm.  Using tobacco products.  What are the signs or symptoms? Symptoms depend on the size of the aneurysm and how fast it grows. There may not be symptoms. If symptoms occur, they can include:  Pain (belly, side, lower back, or groin).  Feeling full after eating a small amount of food.  Feeling sick to your stomach (nauseous), throwing up (vomiting), or both.  Feeling a lump in your belly that feels like it is beating (pulsating).  Feeling like you will pass out (faint).  How is this treated?  Medicine to control blood pressure and pain.  Imaging tests to see if the aneurysm gets bigger.  Surgery. How is this prevented? To lessen your chance of getting this condition:  Stop smoking. Stop chewing tobacco.  Limit or avoid alcohol.  Keep your blood pressure, blood sugar, and cholesterol within normal limits.  Eat less salt.  Eat foods low in saturated fats and cholesterol. These are found in animal and  whole dairy products.  Eat more fiber. Fiber is found in whole grains, vegetables, and fruits.  Keep a healthy weight.  Stay active and exercise often.  This information is not intended to replace advice given to you by your health care provider. Make sure you discuss any questions you have with your health care provider. Document Released: 02/24/2013 Document Revised: 04/06/2016 Document Reviewed: 11/29/2012 Elsevier Interactive Patient Education  2017 Elsevier Inc.  

## 2016-10-09 DIAGNOSIS — M545 Low back pain: Secondary | ICD-10-CM | POA: Diagnosis not present

## 2016-10-12 DIAGNOSIS — M545 Low back pain: Secondary | ICD-10-CM | POA: Diagnosis not present

## 2016-10-16 DIAGNOSIS — M545 Low back pain: Secondary | ICD-10-CM | POA: Diagnosis not present

## 2016-10-19 ENCOUNTER — Other Ambulatory Visit: Payer: Self-pay

## 2016-11-02 ENCOUNTER — Ambulatory Visit (INDEPENDENT_AMBULATORY_CARE_PROVIDER_SITE_OTHER): Payer: Medicare Other

## 2016-11-02 VITALS — BP 118/70 | HR 54 | Temp 97.8°F | Resp 14 | Ht 69.0 in | Wt 197.1 lb

## 2016-11-02 DIAGNOSIS — Z0000 Encounter for general adult medical examination without abnormal findings: Secondary | ICD-10-CM | POA: Diagnosis not present

## 2016-11-02 NOTE — Patient Instructions (Addendum)
  Mr. Claxton , Thank you for taking time to come for your Medicare Wellness Visit. I appreciate your ongoing commitment to your health goals. Please review the following plan we discussed and let me know if I can assist you in the future.   Follow up with Dr. Derrel Nip as needed.  Merry Christmas and Happy New Year!!  These are the goals we discussed: Goals    . Healthy Lifestyle          Stay active and continue exercise regimen. Stretch! Stay hydrated and drink plenty of fluids/water. Low carb foods.  Lean meats, vegetables.       This is a list of the screening recommended for you and due dates:  Health Maintenance  Topic Date Due  . Shingles Vaccine  12/13/2017*  . Colon Cancer Screening  04/07/2017  . Tetanus Vaccine  08/08/2020  . Flu Shot  Completed  . Pneumonia vaccines  Completed  *Topic was postponed. The date shown is not the original due date.

## 2016-11-02 NOTE — Progress Notes (Signed)
Subjective:   Jamie Zhang is a 75 y.o. male who presents for Medicare Annual/Subsequent preventive examination.  Review of Systems:  No ROS.  Medicare Wellness Visit.  Cardiac Risk Factors include: advanced age (>23men, >58 women);male gender     Objective:    Vitals: BP 118/70 (BP Location: Right Arm, Patient Position: Sitting, Cuff Size: Normal)   Pulse (!) 54   Temp 97.8 F (36.6 C) (Oral)   Resp 14   Ht 5\' 9"  (1.753 m)   Wt 197 lb 1.9 oz (89.4 kg)   SpO2 97%   BMI 29.11 kg/m   Body mass index is 29.11 kg/m.  Tobacco History  Smoking Status  . Former Smoker  . Packs/day: 2.00  . Years: 21.00  . Types: Cigarettes  Smokeless Tobacco  . Not on file     Counseling given: Not Answered   Past Medical History:  Diagnosis Date  . Arthritis   . Cataract 01/02/2011   Bilateral - s/p excision  . Colon polyps 2000   Hyperplastic - Repeat colonoscopy 2015  . GERD (gastroesophageal reflux disease)    Pantoprazole  . Hemorrhoids 2006  . HLD (hyperlipidemia)   . Iron deficiency anemia 03/20/2006   Donating too much blood  . Migraine    No longer has problems  . Osteopenia    on Fosamax  . RLS (restless legs syndrome) 03/20/2006   2/2 iron deficiency  . Schatzki's ring   . Skin cancer 01/04/2012   Basal cell nose, squamous cell scalp - Dr. Ula Lingo   Past Surgical History:  Procedure Laterality Date  . BREAST BIOPSY Left    benign  . Cataract Surgery Bilateral   . INGUINAL HERNIA REPAIR Left   . ROTATOR CUFF REPAIR Left    Family History  Problem Relation Age of Onset  . Alcohol abuse Mother   . Arthritis Mother   . Hypertension Mother   . Cancer Father 90    Throat Cancer - died  . Alcohol abuse Father   . Cancer Sister 51    Breast Cancer  . Alcohol abuse Sister    History  Sexual Activity  . Sexual activity: No    Outpatient Encounter Prescriptions as of 11/02/2016  Medication Sig  . alendronate (FOSAMAX) 70 MG tablet TAKE 1 TABLET ONE TIME A  WEEK. TAKE WITH A FULL GLASS OF WATER ON AN EMPTY STOMACH.  [name] aspirin EC 81 MG tablet Take 81 mg by mouth daily.  [name] atorvastatin (LIPITOR) 20 MG tablet TAKE 1 TABLET EVERY DAY  . Calcium Citrate-Vitamin D (CITRACAL + D PO) Take 650 mg by mouth daily. 2 tablets daily  . Cholecalciferol (VITAMIN D3) 10000 UNITS TABS Take 1 tablet by mouth daily.  . diclofenac (VOLTAREN) 75 MG EC tablet Take 1 tablet (75 mg total) by mouth 2 (two) times daily.  . famotidine (PEPCID) 40 MG tablet Take 1 tablet (40 mg total) by mouth daily.  . Flax Oil-Fish Oil-Borage Oil ([employer]S OMEGA-3) CAPS Take per instructions  . fluticasone (FLONASE) 50 MCG/ACT nasal spray PLACE 2 SPRAYS INTO BOTH NOSTRILS DAILY.  . magnesium oxide (MAG-OX) 400 MG tablet Take 400 mg by mouth daily.   No facility-administered encounter medications on file as of 11/02/2016.     Activities of Daily Living In your present state of health, do you have any difficulty performing the following activities: 11/02/2016  Hearing? N  Vision? N  Difficulty concentrating or making decisions? N  Walking or climbing stairs?  N  Dressing or bathing? N  Doing errands, shopping? N  Preparing Food and eating ? N  Using the Toilet? N  In the past six months, have you accidently leaked urine? N  Do you have problems with loss of bowel control? N  Managing your Medications? N  Managing your Finances? N  Housekeeping or managing your Housekeeping? N  Some recent data might be hidden    Patient Care Team: Crecencio Mc, MD as PCP - General (Internal Medicine)   Assessment:    This is a routine wellness examination for [employer]. The goal of the wellness visit is to assist the patient how to close the gaps in care and create a preventative care plan for the patient.   Taking calcium VIT D as appropriate/Osteoporosis reviewed.  Medications reviewed; taking without issues or barriers.  Safety issues reviewed; lives with wife.  Smoke detectors in the home.  No firearms in the home. Wears seatbelts when driving or riding with others. No violence in the home.  No identified risk were noted; The patient was oriented x 3; appropriate in dress and manner and no objective failures at ADL's or IADL's.   BMI; discussed the importance of a healthy diet, water intake and exercise. Educational material provided.  Health maintenance gaps; closed.  Patient Concerns: None at this time. Follow up with PCP as needed.  Exercise Activities and Dietary recommendations Current Exercise Habits: Home exercise routine, Type of exercise: walking;treadmill, Time (Minutes): 30, Frequency (Times/Week): 7, Weekly Exercise (Minutes/Week): 210, Intensity: Moderate  Goals    . Healthy Lifestyle          Stay active and continue exercise regimen. Stretch! Stay hydrated and drink plenty of fluids/water. Low carb foods.  Lean meats, vegetables.      Fall Risk Fall Risk  11/02/2016 11/03/2015 05/04/2015 04/01/2014  Falls in the past year? No No No No   Depression Screen PHQ 2/9 Scores 11/02/2016 11/03/2015 05/04/2015 04/01/2014  PHQ - 2 Score 0 0 0 0    Cognitive Function MMSE - Mini Mental State Exam 11/02/2016  Orientation to time 5  Orientation to Place 5  Registration 3  Attention/ Calculation 5  Recall 3  Language- name 2 objects 2  Language- repeat 1  Language- follow 3 step command 3  Language- read & follow direction 1  Write a sentence 1  Copy design 1  Total score 30        Immunization History  Administered Date(s) Administered  . Influenza Split 09/01/2015  . Influenza, High Dose Seasonal PF 08/14/2016  . Influenza-Unspecified 08/25/2014  . Pneumococcal Conjugate-13 05/04/2015  . Pneumococcal Polysaccharide-23 09/19/2005  . Tdap 08/08/2010   Screening Tests Health Maintenance  Topic Date Due  . ZOSTAVAX  12/13/2017 (Originally 04/15/2000)  . COLONOSCOPY  04/07/2017  . TETANUS/TDAP  08/08/2020  . INFLUENZA VACCINE  Completed  .  PNA vac Low Risk Adult  Completed      Plan:    End of life planning; Advance aging; Advanced directives discussed. Copy of current HCPOA/Living Will requested.  Medicare Attestation I have personally reviewed: The patient's medical and social history Their use of alcohol, tobacco or illicit drugs Their current medications and supplements The patient's functional ability including ADLs,fall risks, home safety risks, cognitive, and hearing and visual impairment Diet and physical activities Evidence for depression   The patient's weight, height, BMI, and visual acuity have been recorded in the chart.  I have made referrals and provided education to  the patient based on review of the above and I have provided the patient with a written personalized care plan for preventive services.    During the course of the visit the patient was educated and counseled about the following appropriate screening and preventive services:   Vaccines to include Pneumoccal, Influenza, Hepatitis B, Td, Zostavax, HCV  Electrocardiogram  Cardiovascular Disease  Colorectal cancer screening  Diabetes screening  Prostate Cancer Screening  Glaucoma screening  Nutrition counseling   Smoking cessation counseling  Patient Instructions (the written plan) was given to the patient.    Varney Biles, LPN  [id number]

## 2016-11-06 NOTE — Progress Notes (Signed)
  I have reviewed the above information and agree with above.   Vesta Wheeland, MD 

## 2016-11-09 NOTE — Progress Notes (Signed)
  I have reviewed the above information and agree with above.   Briarrose Shor, MD 

## 2016-12-27 DIAGNOSIS — X32XXXA Exposure to sunlight, initial encounter: Secondary | ICD-10-CM | POA: Diagnosis not present

## 2016-12-27 DIAGNOSIS — D225 Melanocytic nevi of trunk: Secondary | ICD-10-CM | POA: Diagnosis not present

## 2016-12-27 DIAGNOSIS — D2261 Melanocytic nevi of right upper limb, including shoulder: Secondary | ICD-10-CM | POA: Diagnosis not present

## 2016-12-27 DIAGNOSIS — D2272 Melanocytic nevi of left lower limb, including hip: Secondary | ICD-10-CM | POA: Diagnosis not present

## 2016-12-27 DIAGNOSIS — Z85828 Personal history of other malignant neoplasm of skin: Secondary | ICD-10-CM | POA: Diagnosis not present

## 2016-12-27 DIAGNOSIS — L570 Actinic keratosis: Secondary | ICD-10-CM | POA: Diagnosis not present

## 2017-01-15 ENCOUNTER — Other Ambulatory Visit: Payer: Self-pay | Admitting: Internal Medicine

## 2017-02-14 ENCOUNTER — Encounter: Payer: Self-pay | Admitting: *Deleted

## 2017-02-14 ENCOUNTER — Ambulatory Visit (INDEPENDENT_AMBULATORY_CARE_PROVIDER_SITE_OTHER): Payer: Medicare Other | Admitting: Internal Medicine

## 2017-02-14 ENCOUNTER — Encounter: Payer: Self-pay | Admitting: Internal Medicine

## 2017-02-14 ENCOUNTER — Telehealth: Payer: Self-pay | Admitting: General Surgery

## 2017-02-14 VITALS — BP 100/70 | HR 55 | Temp 98.1°F | Resp 15 | Ht 69.0 in | Wt 198.0 lb

## 2017-02-14 DIAGNOSIS — R5383 Other fatigue: Secondary | ICD-10-CM

## 2017-02-14 DIAGNOSIS — E663 Overweight: Secondary | ICD-10-CM

## 2017-02-14 DIAGNOSIS — D126 Benign neoplasm of colon, unspecified: Secondary | ICD-10-CM

## 2017-02-14 DIAGNOSIS — E559 Vitamin D deficiency, unspecified: Secondary | ICD-10-CM | POA: Diagnosis not present

## 2017-02-14 DIAGNOSIS — E7800 Pure hypercholesterolemia, unspecified: Secondary | ICD-10-CM

## 2017-02-14 DIAGNOSIS — Z79899 Other long term (current) drug therapy: Secondary | ICD-10-CM | POA: Diagnosis not present

## 2017-02-14 DIAGNOSIS — R7301 Impaired fasting glucose: Secondary | ICD-10-CM | POA: Diagnosis not present

## 2017-02-14 DIAGNOSIS — M7061 Trochanteric bursitis, right hip: Secondary | ICD-10-CM

## 2017-02-14 DIAGNOSIS — I700 Atherosclerosis of aorta: Secondary | ICD-10-CM

## 2017-02-14 DIAGNOSIS — I714 Abdominal aortic aneurysm, without rupture, unspecified: Secondary | ICD-10-CM

## 2017-02-14 DIAGNOSIS — M8580 Other specified disorders of bone density and structure, unspecified site: Secondary | ICD-10-CM

## 2017-02-14 DIAGNOSIS — Z86010 Personal history of colonic polyps: Secondary | ICD-10-CM

## 2017-02-14 DIAGNOSIS — Z1211 Encounter for screening for malignant neoplasm of colon: Secondary | ICD-10-CM

## 2017-02-14 LAB — COMPREHENSIVE METABOLIC PANEL
ALK PHOS: 40 U/L (ref 39–117)
ALT: 17 U/L (ref 0–53)
AST: 16 U/L (ref 0–37)
Albumin: 4.5 g/dL (ref 3.5–5.2)
BILIRUBIN TOTAL: 1.5 mg/dL — AB (ref 0.2–1.2)
BUN: 18 mg/dL (ref 6–23)
CO2: 30 mEq/L (ref 19–32)
CREATININE: 0.94 mg/dL (ref 0.40–1.50)
Calcium: 9.5 mg/dL (ref 8.4–10.5)
Chloride: 107 mEq/L (ref 96–112)
GFR: 82.75 mL/min (ref >60.00)
GLUCOSE: 101 mg/dL — AB (ref 70–99)
Potassium: 4.3 mEq/L (ref 3.5–5.1)
Sodium: 141 mEq/L (ref 135–145)
TOTAL PROTEIN: 6.7 g/dL (ref 6.0–8.3)

## 2017-02-14 LAB — CBC WITH DIFFERENTIAL/PLATELET
BASOS ABS: 0.1 10*3/uL (ref 0.0–0.1)
Basophils Relative: 1 % (ref 0.0–3.0)
Eosinophils Absolute: 0.1 10*3/uL (ref 0.0–0.7)
Eosinophils Relative: 1.8 % (ref 0.0–5.0)
HEMATOCRIT: 46.1 % (ref 39.0–52.0)
Hemoglobin: 15.7 g/dL (ref 13.0–17.0)
LYMPHS PCT: 16.4 % (ref 12.0–46.0)
Lymphs Abs: 1.1 10*3/uL (ref 0.7–4.0)
MCHC: 34 g/dL (ref 30.0–36.0)
MCV: 92.9 fl (ref 78.0–100.0)
MONOS PCT: 8.6 % (ref 3.0–12.0)
Monocytes Absolute: 0.6 10*3/uL (ref 0.1–1.0)
Neutro Abs: 4.7 10*3/uL (ref 1.4–7.7)
Neutrophils Relative %: 72.2 % (ref 43.0–77.0)
Platelets: 181 10*3/uL (ref 150.0–400.0)
RBC: 4.97 Mil/uL (ref 4.22–5.81)
RDW: 14.1 % (ref 11.5–15.5)
WBC: 6.5 10*3/uL (ref 4.0–10.5)

## 2017-02-14 LAB — LIPID PANEL
CHOL/HDL RATIO: 2
Cholesterol: 125 mg/dL (ref 0–200)
HDL: 57.3 mg/dL (ref >39.00)
LDL CALC: 54 mg/dL (ref 0–99)
NONHDL: 67.6
Triglycerides: 66 mg/dL (ref 0.0–149.0)
VLDL: 13.2 mg/dL (ref 0.0–40.0)

## 2017-02-14 LAB — TSH: TSH: 2.31 u[IU]/mL (ref 0.35–4.50)

## 2017-02-14 LAB — HEMOGLOBIN A1C: HEMOGLOBIN A1C: 5.7 % (ref 4.6–6.5)

## 2017-02-14 LAB — VITAMIN D 25 HYDROXY (VIT D DEFICIENCY, FRACTURES): VITD: 37.43 ng/mL (ref 30.00–100.00)

## 2017-02-14 MED ORDER — DICLOFENAC SODIUM 75 MG PO TBEC: 75.0000 mg | DELAYED_RELEASE_TABLET | Freq: Two times a day (BID) | ORAL | 2 refills | Status: DC

## 2017-02-14 MED ORDER — TRAMADOL HCL 50 MG PO TABS: 50.0000 mg | ORAL_TABLET | Freq: Three times a day (TID) | ORAL | 0 refills | Status: DC | PRN

## 2017-02-14 NOTE — Telephone Encounter (Signed)
L/M WITH PT'S WIFE FOR HIM TO CALL BACK AN SCHEDULE AN APPOINTMENT WITH DR BYRNETT FOR A COLONOSCOPY/HAD ONE ON 04-07-14 BY DR MATTHEW REIN & IT'S IN EPIC/REF'D BY DR Derrel Nip

## 2017-02-14 NOTE — Progress Notes (Signed)
Pre visit review using our clinic review tool, if applicable. No additional management support is needed unless otherwise documented below in the visit note. 

## 2017-02-14 NOTE — Patient Instructions (Signed)
Referral to Dr Job Founds for colonoscopy is in process  Take the rx tramadol with you to [place].  Can be combined with diclofenac for pain control   recommend getting the majority of your calcium and Vitamin D  through diet rather than supplements given the recent association of calcium supplements with increased coronary artery calcium scores  Try the almond and cashew milks that most grocery stores  now carry  in the dairy  Section>   They are lactose free:  [employer] brand Almond Light,  Original formula.  Delicious,  Low carb,  Low cal,  Cholesterol free   Have a great trip!!

## 2017-02-14 NOTE — Progress Notes (Signed)
Patient ID: Jamie Zhang, male    DOB: 11-03-1941  Age: 76 y.o. MRN: [id number]  The patient is here for annual follow up and management of other chronic and acute problems.    12 month f u an AAA due in Nov 2018 ATHEROCLEROSIS . STATIN STARTED  LOW BACK PAIN ON RIGHT,  NON RADIATING Tubular adenomas  2 of 6 noted on 2015 colonoscopy    Going to [place] in a week   The risk factors are reflected in the social history.  The roster of all physicians providing medical care to patient - is listed in the Snapshot section of the chart.  Activities of daily living:  The patient is 100% independent in all ADLs: dressing, toileting, feeding as well as independent mobility  Home safety : The patient has smoke detectors in the home. They wear seatbelts.  There are no firearms at home. There is no violence in the home.   There is no risks for hepatitis, STDs or HIV. There is no   history of blood transfusion. They have no travel history to infectious disease endemic areas of the world.  The patient has seen their dentist in the last six month. They have seen their eye doctor (Foss) ,  in the last year. History of laser surgery left eye one year.  They admit to slight hearing difficulty with regard to whispered voices and some television programs.  he has  deferred audiologic testing in the last year.  They do not  have excessive sun exposure. Discussed the need for sun protection: hats, long sleeves and use of sunscreen if there is significant sun exposure.   Diet: the importance of a healthy diet is discussed. They do have a healthy diet.  The benefits of regular aerobic exercise were discussed. he walks 6 times per week ,  30 minutes.   And water volleyball twice weekly   Depression screen: there are no signs or vegative symptoms of depression- irritability, change in appetite, anhedonia, sadness/tearfullness.  Cognitive assessment: the patient manages all their financial and personal affairs  and is actively engaged. They could relate day,date,year and events; recalled 2/3 objects at 3 minutes; performed clock-face test normally.  The following portions of the patient's history were reviewed and updated as appropriate: allergies, current medications, past family history, past medical history,  past surgical history, past social history  and problem list.  Visual acuity was not assessed per patient preference since she has regular follow up with her ophthalmologist. Hearing and body mass index were assessed and reviewed.   During the course of the visit the patient was educated and counseled about appropriate screening and preventive services including : fall prevention , diabetes screening, nutrition counseling, colorectal cancer screening, and recommended immunizations.    CC: The primary encounter diagnosis was Encounter for colonoscopy due to history of colonic polyp. Diagnoses of Pure hypercholesterolemia, Impaired fasting glucose, Osteopenia determined by x-ray, Long-term use of high-risk medication, Vitamin D deficiency, Other fatigue, Trochanteric bursitis of right hip, Overweight (BMI 25.0-29.9), Abdominal aortic aneurysm (AAA) without rupture ([place]), Atherosclerosis of aorta without gangrene ([place]), and Tubular adenoma of colon were also pertinent to this visit.  History Gloria has a past medical history of Arthritis; Cataract (01/02/2011); Colon polyps (2000); GERD (gastroesophageal reflux disease); Hemorrhoids (2006); HLD (hyperlipidemia); Iron deficiency anemia (03/20/2006); Migraine; Osteopenia; RLS (restless legs syndrome) (03/20/2006); Schatzki's ring; and Skin cancer (01/04/2012).   He has a past surgical history that includes Cataract Surgery (Bilateral); Rotator cuff  repair (Left); Breast biopsy (Left); and Inguinal hernia repair (Left).   His family history includes Alcohol abuse in his father, mother, and sister; Arthritis in his mother; Cancer (age of onset: 33) in his sister;  Cancer (age of onset: 68) in his father; Hypertension in his mother.He reports that he has quit smoking. His smoking use included Cigarettes. He has a 42.00 pack-year smoking history. He has never used smokeless tobacco. He reports that he does not drink alcohol or use drugs.  Outpatient Medications Prior to Visit  Medication Sig Dispense Refill  . alendronate (FOSAMAX) 70 MG tablet TAKE 1 TABLET ONE TIME A WEEK. TAKE WITH A FULL GLASS OF WATER ON AN EMPTY STOMACH. 12 tablet 5  . aspirin EC 81 MG tablet Take 81 mg by mouth daily.    [name] atorvastatin (LIPITOR) 20 MG tablet TAKE 1 TABLET EVERY DAY 90 tablet 1  . Calcium Citrate-Vitamin D (CITRACAL + D PO) Take 650 mg by mouth daily. 2 tablets daily    . Cholecalciferol (VITAMIN D3) 10000 UNITS TABS Take 1 tablet by mouth daily.    . famotidine (PEPCID) 40 MG tablet Take 1 tablet (40 mg total) by mouth daily. 90 tablet 1  . Flax Oil-Fish Oil-Borage Oil ([employer]S OMEGA-3) CAPS Take per instructions 30 capsule 6  . fluticasone (FLONASE) 50 MCG/ACT nasal spray USE 2 SPRAYS IN EACH NOSTRIL EVERY DAY 48 g 2  . magnesium oxide (MAG-OX) 400 MG tablet Take 400 mg by mouth daily.    . diclofenac (VOLTAREN) 75 MG EC tablet Take 1 tablet (75 mg total) by mouth 2 (two) times daily. 60 tablet 2   No facility-administered medications prior to visit.     Review of Systems   Patient denies headache, fevers, malaise, unintentional weight loss, skin rash, eye pain, sinus congestion and sinus pain, sore throat, dysphagia,  hemoptysis , cough, dyspnea, wheezing, chest pain, palpitations, orthopnea, edema, abdominal pain, nausea, melena, diarrhea, constipation, flank pain, dysuria, hematuria, urinary  Frequency, nocturia, numbness, tingling, seizures,  Focal weakness, Loss of consciousness,  Tremor, insomnia, depression, anxiety, and suicidal ideation.      Objective:  BP 100/70   Pulse (!) 55   Temp 98.1 F (36.7 C) (Oral)   Resp 15   Ht 5\' 9"  (1.753 m)   Wt 198 lb  (89.8 kg)   SpO2 96%   BMI 29.24 kg/m   Physical Exam   General appearance: alert, cooperative and appears stated age Ears: normal TM's and external ear canals both ears Throat: lips, mucosa, and tongue normal; teeth and gums normal Neck: no adenopathy, no carotid bruit, supple, symmetrical, trachea midline and thyroid not enlarged, symmetric, no tenderness/mass/nodules Back: symmetric, no curvature. ROM normal. No CVA tenderness. Lungs: clear to auscultation bilaterally Heart: regular rate and rhythm, S1, S2 normal, no murmur, click, rub or gallop Abdomen: soft, non-tender; bowel sounds normal; no masses,  no organomegaly Pulses: 2+ and symmetric Skin: Skin color, texture, turgor normal. No rashes or lesions Lymph nodes: Cervical, supraclavicular, and axillary nodes normal.    Assessment & Plan:   Problem List Items Addressed This Visit    Abdominal aortic aneurysm (HCC)    Referred l to [facility] for 4 cm AA noted on CT scan initially in 2015.  Continue annual surveillance and aggressive control of blood pressure       Atherosclerosis of aorta without gangrene ([place])    Now on statin therapy.    Now followed by Vascular surgery for monitoring of  aortic aneurysm      Bursitis of hip    Aggravated by prolonged walking.  Tramadol and diclofenac prescribed      HLD (hyperlipidemia)    He is tolerating  lipitor ,  Started for CT findings of atherosclerosis  Lab Results  Component Value Date   CHOL 125 02/14/2017   HDL 57.30 02/14/2017   LDLCALC 54 02/14/2017   LDLDIRECT 47.0 12/24/2015   TRIG 66.0 02/14/2017   CHOLHDL 2 02/14/2017   Lab Results  Component Value Date   ALT 17 02/14/2017   AST 16 02/14/2017   ALKPHOS 40 02/14/2017   BILITOT 1.5 (H) 02/14/2017         Relevant Orders   Lipid panel (Completed)   Impaired fasting glucose    He has lowered his a1c to a normal range by losing weight and reducing the sugar in his diet.   Lab Results  Component Value  Date   HGBA1C 5.7 02/14/2017         Relevant Orders   Hemoglobin A1c (Completed)   Osteopenia determined by x-ray   Overweight (BMI 25.0-29.9)    I have addressed  BMI and recommended a low glycemic index diet utilizing smaller more frequent meals to increase metabolism.  I have also recommended that patient start exercising with a goal of 30 minutes of aerobic exercise a minimum of 5 days per week. Screening for lipid disorders, thyroid and diabetes has been done .  Lab Results  Component Value Date   TSH 2.31 02/14/2017   Lab Results  Component Value Date   HGBA1C 5.7 02/14/2017         Tubular adenoma of colon    He is due for 3 year follow up colonoscopy .  Referral to [facility] byrnett.        Other Visit Diagnoses    Encounter for colonoscopy due to history of colonic polyp    -  Primary   Relevant Orders   Ambulatory referral to General Surgery   Long-term use of high-risk medication       Relevant Orders   Comprehensive metabolic panel (Completed)   Vitamin D deficiency       Relevant Orders   VITAMIN D 25 Hydroxy (Vit-D Deficiency, Fractures) (Completed)   Other fatigue       Relevant Orders   CBC with Differential/Platelet (Completed)   TSH (Completed)      I am having Mr. Kocsis start on traMADol. I am also having him maintain his [employer]S OMEGA-3, Calcium Citrate-Vitamin D (CITRACAL + D PO), Vitamin D3, magnesium oxide, alendronate, famotidine, aspirin EC, fluticasone, atorvastatin, and diclofenac.  Meds ordered this encounter  Medications  . diclofenac (VOLTAREN) 75 MG EC tablet    Sig: Take 1 tablet (75 mg total) by mouth 2 (two) times daily.    Dispense:  60 tablet    Refill:  2    Keep on file for future refills  . traMADol (ULTRAM) 50 MG tablet    Sig: Take 1 tablet (50 mg total) by mouth every 8 (eight) hours as needed.    Dispense:  60 tablet    Refill:  0   A total of 40 minutes was spent with patient more than half of which was spent in  counseling patient on the above mentioned issues , reviewing and explaining recent labs and imaging studies done, and coordination of care. Medications Discontinued During This Encounter  Medication Reason  . diclofenac (VOLTAREN) 75 MG EC  tablet Reorder    Follow-up: Return in about 6 months (around 08/16/2017).   Crecencio Mc, MD

## 2017-02-15 DIAGNOSIS — D126 Benign neoplasm of colon, unspecified: Secondary | ICD-10-CM | POA: Insufficient documentation

## 2017-02-15 NOTE — Assessment & Plan Note (Signed)
He is tolerating  lipitor ,  Started for CT findings of atherosclerosis  Lab Results  Component Value Date   CHOL 125 02/14/2017   HDL 57.30 02/14/2017   LDLCALC 54 02/14/2017   LDLDIRECT 47.0 12/24/2015   TRIG 66.0 02/14/2017   CHOLHDL 2 02/14/2017   Lab Results  Component Value Date   ALT 17 02/14/2017   AST 16 02/14/2017   ALKPHOS 40 02/14/2017   BILITOT 1.5 (H) 02/14/2017

## 2017-02-15 NOTE — Assessment & Plan Note (Signed)
Referred l to [facility] for 4 cm AA noted on CT scan initially in 2015.  Continue annual surveillance and aggressive control of blood pressure  

## 2017-02-15 NOTE — Assessment & Plan Note (Signed)
He has lowered his a1c to a normal range by losing weight and reducing the sugar in his diet.   Lab Results  Component Value Date   HGBA1C 5.7 02/14/2017

## 2017-02-15 NOTE — Assessment & Plan Note (Signed)
Now on statin therapy.    Now followed by Vascular surgery for monitoring of  aortic aneurysm

## 2017-02-15 NOTE — Assessment & Plan Note (Signed)
He is due for 3 year follow up colonoscopy .  Referral to [facility] byrnett.

## 2017-02-15 NOTE — Assessment & Plan Note (Addendum)
I have addressed  BMI and recommended a low glycemic index diet utilizing smaller more frequent meals to increase metabolism.  I have also recommended that patient start exercising with a goal of 30 minutes of aerobic exercise a minimum of 5 days per week. Screening for lipid disorders, thyroid and diabetes has been done .  Lab Results  Component Value Date   TSH 2.31 02/14/2017   Lab Results  Component Value Date   HGBA1C 5.7 02/14/2017

## 2017-02-15 NOTE — Assessment & Plan Note (Signed)
Aggravated by prolonged walking.  Tramadol and diclofenac prescribed

## 2017-02-18 ENCOUNTER — Encounter: Payer: Self-pay | Admitting: Internal Medicine

## 2017-02-19 ENCOUNTER — Telehealth: Payer: Self-pay | Admitting: General Surgery

## 2017-02-19 NOTE — Telephone Encounter (Signed)
L/M WITH WIFE FOR PATIENT TO CALL & RESCHEDULE APPT CURRENTLY ON 03-07-17 @ 11:30 AM WITH DR BYRNETT. HE NEEDS TO RESCHEDULE EITHER LATER 03-07-17 OR TO A DIFFERENT DAY./MTH

## 2017-03-07 ENCOUNTER — Ambulatory Visit: Payer: Medicare Other | Admitting: General Surgery

## 2017-03-13 ENCOUNTER — Encounter: Payer: Self-pay | Admitting: General Surgery

## 2017-03-13 ENCOUNTER — Ambulatory Visit (INDEPENDENT_AMBULATORY_CARE_PROVIDER_SITE_OTHER): Payer: Medicare Other | Admitting: General Surgery

## 2017-03-13 VITALS — BP 122/68 | HR 56 | Resp 12 | Ht 69.0 in | Wt 197.0 lb

## 2017-03-13 DIAGNOSIS — Z860100 Personal history of colon polyps, unspecified: Secondary | ICD-10-CM | POA: Insufficient documentation

## 2017-03-13 MED ORDER — POLYETHYLENE GLYCOL 3350 17 GM/SCOOP PO POWD: 1.0000 | Freq: Once | ORAL | 0 refills | Status: AC

## 2017-03-13 NOTE — Progress Notes (Signed)
Patient ID: Jamie Zhang, male   DOB: September 11, 1941, 76 y.o.   MRN: [id number]  Chief Complaint  Patient presents with  . Colonoscopy    HPI [name] is a 76 y.o. male here today for a evaluation of a  colonoscopy. Last colonoscopy was done 04/07/2014. No GI problems at this time. Moves his bowels daily. He takes a stool softener.  HPI  Past Medical History:  Diagnosis Date  . Arthritis   . Cataract 01/02/2011   Bilateral - s/p excision  . Colon polyps 2000   Hyperplastic - Repeat colonoscopy 2015  . GERD (gastroesophageal reflux disease)    Pantoprazole  . Hemorrhoids 2006  . HLD (hyperlipidemia)   . Iron deficiency anemia 03/20/2006   Donating too much blood  . Migraine    No longer has problems  . Osteopenia    on Fosamax  . RLS (restless legs syndrome) 03/20/2006   2/2 iron deficiency  . Schatzki's ring   . Skin cancer 01/04/2012   Basal cell nose, squamous cell scalp - Dr. Ula Lingo    Past Surgical History:  Procedure Laterality Date  . BREAST BIOPSY Left    benign  . Cataract Surgery Bilateral   . INGUINAL HERNIA REPAIR Left   . ROTATOR CUFF REPAIR Left     Family History  Problem Relation Age of Onset  . Alcohol abuse Mother   . Arthritis Mother   . Hypertension Mother   . Cancer Father 49    Throat Cancer - died  . Alcohol abuse Father   . Cancer Sister 75    Breast Cancer  . Alcohol abuse Sister     Social History Social History  Substance Use Topics  . Smoking status: Former Smoker    Packs/day: 2.00    Years: 21.00    Types: Cigarettes  . Smokeless tobacco: Never Used  . Alcohol use Yes     Comment: Occasional wine glass    No Known Allergies  Current Outpatient Prescriptions  Medication Sig Dispense Refill  . alendronate (FOSAMAX) 70 MG tablet TAKE 1 TABLET ONE TIME A WEEK. TAKE WITH A FULL GLASS OF WATER ON AN EMPTY STOMACH. 12 tablet 5  . aspirin EC 81 MG tablet Take 81 mg by mouth daily.    [name] atorvastatin (LIPITOR) 20 MG tablet TAKE 1  TABLET EVERY DAY 90 tablet 1  . Calcium Citrate-Vitamin D (CITRACAL + D PO) Take 650 mg by mouth daily. 2 tablets daily    . Cholecalciferol (VITAMIN D3) 10000 UNITS TABS Take 1 tablet by mouth daily.    . diclofenac (VOLTAREN) 75 MG EC tablet Take 1 tablet (75 mg total) by mouth 2 (two) times daily. 60 tablet 2  . docusate sodium (STOOL SOFTENER) 100 MG capsule Take 100 mg by mouth 2 (two) times daily.    . famotidine (PEPCID) 40 MG tablet Take 1 tablet (40 mg total) by mouth daily. 90 tablet 1  . Flax Oil-Fish Oil-Borage Oil (CVS OMEGA-3) CAPS Take per instructions 30 capsule 6  . fluticasone (FLONASE) 50 MCG/ACT nasal spray USE 2 SPRAYS IN EACH NOSTRIL EVERY DAY 48 g 2  . polyethylene glycol powder (GLYCOLAX/MIRALAX) powder Take 255 g by mouth once. Mix whole container with 64 ounces of clear liquids 255 g 0   No current facility-administered medications for this visit.     Review of Systems Review of Systems  Constitutional: Negative.   Respiratory: Negative.   Cardiovascular: Negative.  Blood pressure 122/68, pulse (!) 56, resp. rate 12, height 5\' 9"  (1.753 m), weight 197 lb (89.4 kg).  Physical Exam Physical Exam  Constitutional: He is oriented to person, place, and time. He appears well-developed and well-nourished.  Cardiovascular: Normal rate, regular rhythm and normal heart sounds.   Pulmonary/Chest: Effort normal and breath sounds normal.  Neurological: He is alert and oriented to person, place, and time.  Skin: Skin is warm and dry.    Data Reviewed Colonoscopy completed 04/07/2014 completed by Arther Dames, M.D. showed 4 small polyps in the transverse/descending colon and as well as one in the ascending colon. There were 5 tubular adenomas and 3 hyperplastic polyps. No evidence of high-grade dysplasia.  Assessment    More than 3 colonic polyps on last exam, candidate for 3 year follow-up.    Plan         Colonoscopy with possible biopsy/polypectomy prn:  Information regarding the procedure, including its potential risks and complications (including but not limited to perforation of the bowel, which may require emergency surgery to repair, and bleeding) was verbally given to the patient. Educational information regarding lower intestinal endoscopy was given to the patient. Written instructions for how to complete the bowel prep using Miralax were provided. The importance of drinking ample fluids to avoid dehydration as a result of the prep emphasized.  HPI, Physical Exam, Assessment and Plan have been scribed under the direction and in the presence of Hervey Ard, MD.  Gaspar Cola, CMA  The patient is scheduled for a Colonoscopy at [facility] on 04/18/17. They are aware to call the day before to get their arrival time. He will stop his fish oil one week prior. He may continue his 81 mg Aspirin.  Miralax prescription has been sent into the patient's pharmacy. The patient is aware of date and instructions.  Documented by Lesly Rubenstein LPN  I have completed the exam and reviewed the above documentation for accuracy and completeness.  I agree with the above.  Haematologist has been used and any errors in dictation or transcription are unintentional.  Hervey Ard, M.D., F.A.C.S.  Robert Bellow 03/13/2017, 12:47 PM

## 2017-03-13 NOTE — Patient Instructions (Addendum)
Colonoscopy, Adult A colonoscopy is an exam to look at the entire large intestine. During the exam, a lubricated, bendable tube is inserted into the anus and then passed into the rectum, colon, and other parts of the large intestine. A colonoscopy is often done as a part of normal colorectal screening or in response to certain symptoms, such as anemia, persistent diarrhea, abdominal pain, and blood in the stool. The exam can help screen for and diagnose medical problems, including:  Tumors.  Polyps.  Inflammation.  Areas of bleeding. Tell a health care provider about:  Any allergies you have.  All medicines you are taking, including vitamins, herbs, eye drops, creams, and over-the-counter medicines.  Any problems you or family members have had with anesthetic medicines.  Any blood disorders you have.  Any surgeries you have had.  Any medical conditions you have.  Any problems you have had passing stool. What are the risks? Generally, this is a safe procedure. However, problems may occur, including:  Bleeding.  A tear in the intestine.  A reaction to medicines given during the exam.  Infection (rare). What happens before the procedure? Eating and drinking restrictions  Follow instructions from your health care provider about eating and drinking, which may include:  A few days before the procedure - follow a low-fiber diet. Avoid nuts, seeds, dried fruit, raw fruits, and vegetables.  1-3 days before the procedure - follow a clear liquid diet. Drink only clear liquids, such as clear broth or bouillon, black coffee or tea, clear juice, clear soft drinks or sports drinks, gelatin dessert, and popsicles. Avoid any liquids that contain red or purple dye.  On the day of the procedure - do not eat or drink anything during the 2 hours before the procedure, or within the time period that your health care provider recommends. Bowel prep  If you were prescribed an oral bowel prep to  clean out your colon:  Take it as told by your health care provider. Starting the day before your procedure, you will need to drink a large amount of medicated liquid. The liquid will cause you to have multiple loose stools until your stool is almost clear or light green.  If your skin or anus gets irritated from diarrhea, you may use these to relieve the irritation:  Medicated wipes, such as adult wet wipes with aloe and vitamin E.  A skin soothing-product like petroleum jelly.  If you vomit while drinking the bowel prep, take a break for up to 60 minutes and then begin the bowel prep again. If vomiting continues and you cannot take the bowel prep without vomiting, call your health care provider. General instructions   Ask your health care provider about changing or stopping your regular medicines. This is especially important if you are taking diabetes medicines or blood thinners.  Plan to have someone take you home from the hospital or clinic. What happens during the procedure?  An IV tube may be inserted into one of your veins.  You will be given medicine to help you relax (sedative).  To reduce your risk of infection:  Your health care team will wash or sanitize their hands.  Your anal area will be washed with soap.  You will be asked to lie on your side with your knees bent.  Your health care provider will lubricate a long, thin, flexible tube. The tube will have a camera and a light on the end.  The tube will be inserted into your anus.    The tube will be gently eased through your rectum and colon.  Air will be delivered into your colon to keep it open. You may feel some pressure or cramping.  The camera will be used to take images during the procedure.  A small tissue sample may be removed from your body to be examined under a microscope (biopsy). If any potential problems are found, the tissue will be sent to a lab for testing.  If small polyps are found, your health  care provider may remove them and have them checked for cancer cells.  The tube that was inserted into your anus will be slowly removed. The procedure may vary among health care providers and hospitals. What happens after the procedure?  Your blood pressure, heart rate, breathing rate, and blood oxygen level will be monitored until the medicines you were given have worn off.  Do not drive for 24 hours after the exam.  You may have a small amount of blood in your stool.  You may pass gas and have mild abdominal cramping or bloating due to the air that was used to inflate your colon during the exam.  It is up to you to get the results of your procedure. Ask your health care provider, or the department performing the procedure, when your results will be ready. This information is not intended to replace advice given to you by your health care provider. Make sure you discuss any questions you have with your health care provider. Document Released: 10/27/2000 Document Revised: 08/30/2016 Document Reviewed: 01/11/2016 Elsevier Interactive Patient Education  2017 [employer].  The patient is scheduled for a Colonoscopy at [facility] on 04/18/17. They are aware to call the day before to get their arrival time. He will stop his fish oil one week prior. He may continue his 81 mg Aspirin.  Miralax prescription has been sent into the patient's pharmacy. The patient is aware of date and instructions.

## 2017-04-12 ENCOUNTER — Encounter: Payer: Self-pay | Admitting: *Deleted

## 2017-04-18 ENCOUNTER — Ambulatory Visit: Payer: Medicare Other | Admitting: Anesthesiology

## 2017-04-18 ENCOUNTER — Ambulatory Visit
Admission: RE | Admit: 2017-04-18 | Discharge: 2017-04-18 | Disposition: A | Discharge status: Home / Self Care | Payer: Medicare Other | Source: Ambulatory Visit | Attending: General Surgery | Admitting: General Surgery

## 2017-04-18 ENCOUNTER — Encounter
Admission: RE | Disposition: A | Discharge status: Home / Self Care | Payer: Self-pay | Source: Ambulatory Visit | Attending: General Surgery

## 2017-04-18 DIAGNOSIS — K621 Rectal polyp: Secondary | ICD-10-CM | POA: Diagnosis not present

## 2017-04-18 DIAGNOSIS — M1990 Unspecified osteoarthritis, unspecified site: Secondary | ICD-10-CM | POA: Insufficient documentation

## 2017-04-18 DIAGNOSIS — D123 Benign neoplasm of transverse colon: Secondary | ICD-10-CM | POA: Insufficient documentation

## 2017-04-18 DIAGNOSIS — I739 Peripheral vascular disease, unspecified: Secondary | ICD-10-CM | POA: Insufficient documentation

## 2017-04-18 DIAGNOSIS — Z7982 Long term (current) use of aspirin: Secondary | ICD-10-CM | POA: Insufficient documentation

## 2017-04-18 DIAGNOSIS — Z87891 Personal history of nicotine dependence: Secondary | ICD-10-CM | POA: Insufficient documentation

## 2017-04-18 DIAGNOSIS — E785 Hyperlipidemia, unspecified: Secondary | ICD-10-CM | POA: Insufficient documentation

## 2017-04-18 DIAGNOSIS — D126 Benign neoplasm of colon, unspecified: Secondary | ICD-10-CM | POA: Diagnosis not present

## 2017-04-18 DIAGNOSIS — D128 Benign neoplasm of rectum: Secondary | ICD-10-CM | POA: Diagnosis not present

## 2017-04-18 DIAGNOSIS — Z85828 Personal history of other malignant neoplasm of skin: Secondary | ICD-10-CM | POA: Diagnosis not present

## 2017-04-18 DIAGNOSIS — Z1211 Encounter for screening for malignant neoplasm of colon: Secondary | ICD-10-CM | POA: Diagnosis not present

## 2017-04-18 DIAGNOSIS — K219 Gastro-esophageal reflux disease without esophagitis: Secondary | ICD-10-CM | POA: Diagnosis not present

## 2017-04-18 DIAGNOSIS — G2581 Restless legs syndrome: Secondary | ICD-10-CM | POA: Diagnosis not present

## 2017-04-18 DIAGNOSIS — Z86010 Personal history of colonic polyps: Secondary | ICD-10-CM | POA: Insufficient documentation

## 2017-04-18 DIAGNOSIS — K5730 Diverticulosis of large intestine without perforation or abscess without bleeding: Secondary | ICD-10-CM | POA: Insufficient documentation

## 2017-04-18 DIAGNOSIS — Z79899 Other long term (current) drug therapy: Secondary | ICD-10-CM | POA: Insufficient documentation

## 2017-04-18 DIAGNOSIS — D649 Anemia, unspecified: Secondary | ICD-10-CM | POA: Diagnosis not present

## 2017-04-18 DIAGNOSIS — M8580 Other specified disorders of bone density and structure, unspecified site: Secondary | ICD-10-CM | POA: Insufficient documentation

## 2017-04-18 DIAGNOSIS — K635 Polyp of colon: Secondary | ICD-10-CM | POA: Diagnosis not present

## 2017-04-18 HISTORY — PX: COLONOSCOPY WITH PROPOFOL: SHX5780

## 2017-04-18 SURGERY — COLONOSCOPY WITH PROPOFOL
Anesthesia: General

## 2017-04-18 MED ORDER — PROPOFOL 10 MG/ML IV BOLUS
INTRAVENOUS | Status: DC | PRN
  Administered 2017-04-18: 100 mg via INTRAVENOUS

## 2017-04-18 MED ORDER — SODIUM CHLORIDE 0.9 % IV SOLN
INTRAVENOUS | Status: DC
  Administered 2017-04-18: 1000 mL via INTRAVENOUS

## 2017-04-18 MED ORDER — PROPOFOL 500 MG/50ML IV EMUL
INTRAVENOUS | Status: AC
  Filled 2017-04-18: qty 50

## 2017-04-18 MED ORDER — PROPOFOL 500 MG/50ML IV EMUL
INTRAVENOUS | Status: DC | PRN
  Administered 2017-04-18: 120 ug/kg/min via INTRAVENOUS

## 2017-04-18 NOTE — H&P (Signed)
RAYANSH HERBST [id number] May 24, 1941     HPI: Healthy 76 y/o male with multiple polyps three years ago. For f/u exam. Tolerated prep well.   Prescriptions Prior to Admission  Medication Sig Dispense Refill Last Dose  . alendronate (FOSAMAX) 70 MG tablet TAKE 1 TABLET ONE TIME A WEEK. TAKE WITH A FULL GLASS OF WATER ON AN EMPTY STOMACH. 12 tablet 5 04/17/2017 at Unknown time  . aspirin EC 81 MG tablet Take 81 mg by mouth daily.   04/17/2017 at Unknown time  . atorvastatin (LIPITOR) 20 MG tablet TAKE 1 TABLET EVERY DAY 90 tablet 1 04/17/2017 at Unknown time  . Calcium Citrate-Vitamin D (CITRACAL + D PO) Take 650 mg by mouth daily. 2 tablets daily   Past Week at Unknown time  . Cholecalciferol (VITAMIN D3) 10000 UNITS TABS Take 1 tablet by mouth daily.   Past Week at Unknown time  . diclofenac (VOLTAREN) 75 MG EC tablet Take 1 tablet (75 mg total) by mouth 2 (two) times daily. 60 tablet 2 Past Week at Unknown time  . docusate sodium (STOOL SOFTENER) 100 MG capsule Take 100 mg by mouth 2 (two) times daily.   Past Week at Unknown time  . famotidine (PEPCID) 40 MG tablet Take 1 tablet (40 mg total) by mouth daily. 90 tablet 1 Past Week at Unknown time  . Flax Oil-Fish Oil-Borage Oil ([facility] OMEGA-3) CAPS Take per instructions 30 capsule 6 Past Week at Unknown time  . fluticasone (FLONASE) 50 MCG/ACT nasal spray USE 2 SPRAYS IN EACH NOSTRIL EVERY DAY 48 g 2 04/17/2017 at Unknown time   No Known Allergies Past Medical History:  Diagnosis Date  . Arthritis   . Cataract 01/02/2011   Bilateral - s/p excision  . Colon polyps 2000   Hyperplastic - Repeat colonoscopy 2015  . GERD (gastroesophageal reflux disease)    Pantoprazole  . Hemorrhoids 2006  . HLD (hyperlipidemia)   . Iron deficiency anemia 03/20/2006   Donating too much blood  . Migraine    No longer has problems  . Osteopenia    on Fosamax  . RLS (restless legs syndrome) 03/20/2006   2/2 iron deficiency  . Schatzki's ring   . Skin cancer 01/04/2012    Basal cell nose, squamous cell scalp - Dr. Ula Lingo   Past Surgical History:  Procedure Laterality Date  . BREAST BIOPSY Left    benign  . Cataract Surgery Bilateral   . INGUINAL HERNIA REPAIR Left   . ROTATOR CUFF REPAIR Left    Social History   Social History  . Marital status: Married    Spouse name: N/A  . Number of children: 2  . Years of education: 12   Occupational History  . Manager     Worked for [employer]  . Real Estate   . Manager for [employer]    Social History Main Topics  . Smoking status: Former Smoker    Packs/day: 2.00    Years: 21.00    Types: Cigarettes  . Smokeless tobacco: Never Used  . Alcohol use Yes     Comment: Occasional wine glass  . Drug use: No  . Sexual activity: No   Other Topics Concern  . Not on file   Social History Narrative   Mr. Mcmurtrey grew up in [place], [place]. He joined the [employer] right out of [employer] and served for 2.5 years then was active in [employer]. 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 He then worked in [employer] and afterward became a Freight forwarder for [employer]. He recently moved to [place] with his wife. They have two daughters that are still living in [place]. As a hobby he enjoys woodwork mainly with cabinet work.    Social History   Social History Narrative   Mr. Sunde grew up in [place], [place]. He joined the [employer] right out of [employer] and served for 2.5 years then was active in [employer]. 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 He then worked in [employer] and afterward became a Freight forwarder for [employer]. He recently moved to [place] with his wife. They have two daughters that are still living in [place]. As a hobby he enjoys woodwork mainly with cabinet work.      ROS: Negative.     PE: HEENT: Negative. Lungs: Clear. Cardio:  RR.  Assessment/Plan:  Proceed with planned endoscopy.   Robert Bellow 04/18/2017

## 2017-04-18 NOTE — Op Note (Signed)
[facility] Gastroenterology Patient Name: Jamie Zhang Procedure Date: 04/18/2017 11:38 AM MRN: [id number] Account #: [id number] Date of Birth: 1941-03-20 Admit Type: Outpatient Age: 76 Room: [facility] ENDO ROOM 1 Gender: Male Note Status: Finalized Procedure:            Colonoscopy Indications:          High risk colon cancer surveillance: Personal history                        of colonic polyps Providers:            Robert Bellow, MD Referring MD:         Deborra Medina, MD (Referring MD) Medicines:            Monitored Anesthesia Care Complications:        No immediate complications. Procedure:            Pre-Anesthesia Assessment:                       - Prior to the procedure, a History and Physical was                        performed, and patient medications, allergies and                        sensitivities were reviewed. The patient's tolerance of                        previous anesthesia was reviewed.                       - The risks and benefits of the procedure and the                        sedation options and risks were discussed with the                        patient. All questions were answered and informed                        consent was obtained.                       After obtaining informed consent, the colonoscope was                        passed under direct vision. Throughout the procedure,                        the patient's blood pressure, pulse, and oxygen                        saturations were monitored continuously. The                        Colonoscope was introduced through the anus and                        advanced to the the terminal ileum. The colonoscopy was  performed without difficulty. The patient tolerated the                        procedure well. The quality of the bowel preparation                        was excellent. Findings:      Two sessile polyps were found in the distal transverse  colon. The polyps       were 5 mm in size. These were biopsied with a cold forceps for histology.      Two sessile polyps were found in the rectum. The polyps were 5 mm in       size. These were biopsied with a cold forceps for histology.      The retroflexed view of the distal rectum and anal verge was normal and       showed no anal or rectal abnormalities.      A few medium-mouthed diverticula were found in the sigmoid colon. Impression:           - Two 5 mm polyps in the distal transverse colon.                        Biopsied.                       - Two 5 mm polyps in the rectum. Biopsied.                       - The distal rectum and anal verge are normal on                        retroflexion view.                       - Diverticulosis in the sigmoid colon. Recommendation:       - Telephone endoscopist for pathology results in 1 week. Procedure Code(s):    --- Professional ---                       [phone], Colonoscopy, flexible; with biopsy, single or                        multiple Diagnosis Code(s):    --- Professional ---                       Z86.010, Personal history of colonic polyps                       D12.3, Benign neoplasm of transverse colon (hepatic                        flexure or splenic flexure)                       K62.1, Rectal polyp                       K57.30, Diverticulosis of large intestine without                        perforation or abscess without bleeding CPT copyright 2016 American Medical Association. All  rights reserved. The codes documented in this report are preliminary and upon coder review may  be revised to meet current compliance requirements. Robert Bellow, MD 04/18/2017 12:08:59 PM This report has been signed electronically. Number of Addenda: 0 Note Initiated On: 04/18/2017 11:38 AM Scope Withdrawal Time: 0 hours 8 minutes 14 seconds  Total Procedure Duration: 0 hours 15 minutes 47 seconds       [facility]

## 2017-04-18 NOTE — Anesthesia Post-op Follow-up Note (Cosign Needed)
Anesthesia QCDR form completed.        

## 2017-04-18 NOTE — Anesthesia Preprocedure Evaluation (Signed)
Anesthesia Evaluation  Patient identified by MRN, date of birth, ID band Patient awake    Reviewed: Allergy & Precautions, H&P , NPO status , Patient's Chart, lab work & pertinent test results, reviewed documented beta blocker date and time   Airway Mallampati: II   Neck ROM: full    Dental  (+) Poor Dentition, Teeth Intact   Pulmonary neg pulmonary ROS, former smoker,    Pulmonary exam normal        Cardiovascular Exercise Tolerance: Poor + Peripheral Vascular Disease  negative cardio ROS Normal cardiovascular exam Rhythm:regular Rate:Normal     Neuro/Psych  Headaches,  Neuromuscular disease negative neurological ROS  negative psych ROS   GI/Hepatic negative GI ROS, Neg liver ROS, GERD  Medicated,  Endo/Other  negative endocrine ROS  Renal/GU negative Renal ROS  negative genitourinary   Musculoskeletal   Abdominal   Peds  Hematology negative hematology ROS (+) anemia ,   Anesthesia Other Findings Past Medical History: No date: Arthritis 01/02/2011: Cataract     Comment: Bilateral - s/p excision 2000: Colon polyps     Comment: Hyperplastic - Repeat colonoscopy 2015 No date: GERD (gastroesophageal reflux disease)     Comment: Pantoprazole 2006: Hemorrhoids No date: HLD (hyperlipidemia) 03/20/2006: Iron deficiency anemia     Comment: Donating too much blood No date: Migraine     Comment: No longer has problems No date: Osteopenia     Comment: on Fosamax 03/20/2006: RLS (restless legs syndrome)     Comment: 2/2 iron deficiency No date: Schatzki's ring 01/04/2012: Skin cancer     Comment: Basal cell nose, squamous cell scalp - Dr.               Ula Lingo Past Surgical History: No date: BREAST BIOPSY Left     Comment: benign No date: Cataract Surgery Bilateral No date: INGUINAL HERNIA REPAIR Left No date: ROTATOR CUFF REPAIR Left BMI    Body Mass Index:  28.06 kg/m     Reproductive/Obstetrics negative OB  ROS                             Anesthesia Physical Anesthesia Plan  ASA: III  Anesthesia Plan: General   Post-op Pain Management:    Induction:   PONV Risk Score and Plan:   Airway Management Planned:   Additional Equipment:   Intra-op Plan:   Post-operative Plan:   Informed Consent: I have reviewed the patients History and Physical, chart, labs and discussed the procedure including the risks, benefits and alternatives for the proposed anesthesia with the patient or authorized representative who has indicated his/her understanding and acceptance.   Dental Advisory Given  Plan Discussed with: CRNA  Anesthesia Plan Comments:         Anesthesia Quick Evaluation

## 2017-04-18 NOTE — Transfer of Care (Signed)
Immediate Anesthesia Transfer of Care Note  Patient: Jamie Zhang  Procedure(s) Performed: Procedure(s): COLONOSCOPY WITH PROPOFOL (N/A)  Patient Location: PACU and Endoscopy Unit  Anesthesia Type:General  Level of Consciousness: sedated and responds to stimulation  Airway & Oxygen Therapy: Patient Spontanous Breathing and Patient connected to face mask oxygen  Post-op Assessment: Report given to RN and Post -op Vital signs reviewed and stable  Post vital signs: Reviewed and stable  Last Vitals:  Vitals:   04/18/17 0859 04/18/17 1210  BP: 134/70 105/67  Pulse: 62 (!) 50  Resp: 16 14  Temp: 36.3 C     Last Pain:  Vitals:   04/18/17 0859  TempSrc: Tympanic         Complications: No apparent anesthesia complications

## 2017-04-19 ENCOUNTER — Encounter: Payer: Self-pay | Admitting: General Surgery

## 2017-04-19 DIAGNOSIS — H43813 Vitreous degeneration, bilateral: Secondary | ICD-10-CM | POA: Diagnosis not present

## 2017-04-19 LAB — SURGICAL PATHOLOGY

## 2017-04-19 NOTE — Anesthesia Postprocedure Evaluation (Signed)
Anesthesia Post Note  Patient: Jamie Zhang  Procedure(s) Performed: Procedure(s) (LRB): COLONOSCOPY WITH PROPOFOL (N/A)  Patient location during evaluation: PACU Anesthesia Type: General Level of consciousness: awake and alert Pain management: pain level controlled Vital Signs Assessment: post-procedure vital signs reviewed and stable Respiratory status: spontaneous breathing, nonlabored ventilation, respiratory function stable and patient connected to nasal cannula oxygen Cardiovascular status: blood pressure returned to baseline and stable Postop Assessment: no signs of nausea or vomiting Anesthetic complications: no     Last Vitals:  Vitals:   04/18/17 1240 04/18/17 1250  BP: (!) 103/92 124/77  Pulse: (!) 48 (!) 46  Resp: 15 13  Temp:      Last Pain:  Vitals:   04/19/17 0735  TempSrc:   PainSc: 0-No pain                 Molli Barrows

## 2017-05-19 ENCOUNTER — Encounter: Payer: Self-pay | Admitting: Internal Medicine

## 2017-06-11 ENCOUNTER — Other Ambulatory Visit: Payer: Self-pay | Admitting: Internal Medicine

## 2017-08-07 DIAGNOSIS — Z23 Encounter for immunization: Secondary | ICD-10-CM | POA: Diagnosis not present

## 2017-08-17 ENCOUNTER — Encounter: Payer: Self-pay | Admitting: Internal Medicine

## 2017-08-17 ENCOUNTER — Ambulatory Visit (INDEPENDENT_AMBULATORY_CARE_PROVIDER_SITE_OTHER): Payer: Medicare Other | Admitting: Internal Medicine

## 2017-08-17 VITALS — BP 104/68 | HR 51 | Temp 98.1°F | Resp 16 | Ht 69.0 in | Wt 197.0 lb

## 2017-08-17 DIAGNOSIS — M545 Low back pain, unspecified: Secondary | ICD-10-CM

## 2017-08-17 DIAGNOSIS — G8929 Other chronic pain: Secondary | ICD-10-CM

## 2017-08-17 DIAGNOSIS — R7301 Impaired fasting glucose: Secondary | ICD-10-CM

## 2017-08-17 DIAGNOSIS — E559 Vitamin D deficiency, unspecified: Secondary | ICD-10-CM | POA: Diagnosis not present

## 2017-08-17 DIAGNOSIS — Z8781 Personal history of (healed) traumatic fracture: Secondary | ICD-10-CM

## 2017-08-17 DIAGNOSIS — E7800 Pure hypercholesterolemia, unspecified: Secondary | ICD-10-CM | POA: Diagnosis not present

## 2017-08-17 DIAGNOSIS — Z79899 Other long term (current) drug therapy: Secondary | ICD-10-CM | POA: Diagnosis not present

## 2017-08-17 DIAGNOSIS — D126 Benign neoplasm of colon, unspecified: Secondary | ICD-10-CM | POA: Diagnosis not present

## 2017-08-17 NOTE — Patient Instructions (Addendum)
Try  Drinking 8 to 16 ounces of water first thing in the morning .  This will help your bowels AND your kidneys   You have osteoporosis, regardless of what your DEXA scan says,  If you have had prior vertebral fractures..  That's why you have been taking alendronate.   We will continue the alendronate until next year,  And then we will repeat your DEXA and take a one year break from medication for osteoporosis  You an take stool softeners and bulk forming laxatives every day if needed to maintain regularity.  For your back pain,  You can take diclofeanc PLUS TYLENOL (generic is acetaminophen) daily but DO NOT  take Pooler if you are taking diclofenac (they are too similar)   We will repeat your fasting lipids in 2 weeks , PRIOR to resuming Lipitor.  When you resume Lipitor,  Please add a supplement called "Co Q 10" ; this may elminate any joint pain caused by the lipitor.  If the pain returns and is significant,  We will discuss an alternative cholesterol medication called "repatha"

## 2017-08-17 NOTE — Progress Notes (Signed)
Subjective:  Patient ID: Jamie Zhang, male    DOB: 05-20-1941  Age: 76 y.o. MRN: [id number]  CC: The primary encounter diagnosis was Chronic midline low back pain without sciatica. Diagnoses of Pure hypercholesterolemia, Vitamin D deficiency, Long-term use of high-risk medication, Tubular adenoma of colon, History of vertebral compression fracture, and Impaired fasting glucose were also pertinent to this visit.  HPI [name] presents for follow up on hyperlipidemia and othe rissues.   Has back pain , chronic for years.  Prior vertebral fractures. Limits his activities.  Very stiff in the morning,  using diclofenac. .  Told he had osteopenia but has been taking alendronate since 2014. T scores of  -1.2 in 2014 at outside  facility,   Again   -1.2 in 2016 by local films  Has history of kidney stones, but taking one calcium supplement daily  Constipation .  Takes 4 stool softeners daily,   Now adding clear lax. Has a daily BM.   Polyps on colonosocpy done in June.   5 yr follow up per byrnett  Stopped the cholesterol medication 2 weeks ago because he thought it was aggravating his back pain.  Reviewed the reasons for continuing statin therapy, namely the findings of diffuse atherosclerosis with placque ulceration on CT scan      Outpatient Medications Prior to Visit  Medication Sig Dispense Refill  . alendronate (FOSAMAX) 70 MG tablet TAKE 1 TABLET ONE TIME A WEEK. TAKE WITH A FULL GLASS OF WATER ON AN EMPTY STOMACH. 12 tablet 5  . aspirin EC 81 MG tablet Take 81 mg by mouth daily.    [name] atorvastatin (LIPITOR) 20 MG tablet TAKE 1 TABLET EVERY DAY 90 tablet 1  . Calcium Citrate-Vitamin D (CITRACAL + D PO) Take 650 mg by mouth daily. 2 tablets daily    . Cholecalciferol (VITAMIN D3) 10000 UNITS TABS Take 1 tablet by mouth daily.    . diclofenac (VOLTAREN) 75 MG EC tablet Take 1 tablet (75 mg total) by mouth 2 (two) times daily. 60 tablet 2  . docusate sodium (STOOL SOFTENER) 100 MG capsule  Take 100 mg by mouth 2 (two) times daily.    . famotidine (PEPCID) 40 MG tablet Take 1 tablet (40 mg total) by mouth daily. 90 tablet 1  . fluticasone (FLONASE) 50 MCG/ACT nasal spray USE 2 SPRAYS IN EACH NOSTRIL EVERY DAY 48 g 2  . Flax Oil-Fish Oil-Borage Oil ([facility]S OMEGA-3) CAPS Take per instructions (Patient not taking: Reported on 08/17/2017) 30 capsule 6   No facility-administered medications prior to visit.     Review of Systems;  Patient denies headache, fevers, malaise, unintentional weight loss, skin rash, eye pain, sinus congestion and sinus pain, sore throat, dysphagia,  hemoptysis , cough, dyspnea, wheezing, chest pain, palpitations, orthopnea, edema, abdominal pain, nausea, melena, diarrhea, constipation, flank pain, dysuria, hematuria, urinary  Frequency, nocturia, numbness, tingling, seizures,  Focal weakness, Loss of consciousness,  Tremor, insomnia, depression, anxiety, and suicidal ideation.      Objective:  BP 104/68 (BP Location: Left Arm, Patient Position: Sitting, Cuff Size: Normal)   Pulse (!) 51   Temp 98.1 F (36.7 C) (Oral)   Resp 16   Ht 5\' 9"  (1.753 m)   Wt 197 lb (89.4 kg)   SpO2 97%   BMI 29.09 kg/m   BP Readings from Last 3 Encounters:  08/17/17 104/68  04/18/17 124/77  03/13/17 122/68    Wt Readings from Last 3 Encounters:  08/17/17  197 lb (89.4 kg)  04/18/17 190 lb (86.2 kg)  03/13/17 197 lb (89.4 kg)    General appearance: alert, cooperative and appears stated age Ears: normal TM's and external ear canals both ears Throat: lips, mucosa, and tongue normal; teeth and gums normal Neck: no adenopathy, no carotid bruit, supple, symmetrical, trachea midline and thyroid not enlarged, symmetric, no tenderness/mass/nodules Back: symmetric, no curvature. ROM normal. No CVA tenderness. Lungs: clear to auscultation bilaterally Heart: regular rate and rhythm, S1, S2 normal, no murmur, click, rub or gallop Abdomen: soft, non-tender; bowel sounds normal;  no masses,  no organomegaly Pulses: 2+ and symmetric Skin: Skin color, texture, turgor normal. No rashes or lesions Lymph nodes: Cervical, supraclavicular, and axillary nodes normal.  Lab Results  Component Value Date   HGBA1C 5.7 02/14/2017   HGBA1C 5.6 08/14/2016   HGBA1C 5.6 11/03/2015    Lab Results  Component Value Date   CREATININE 0.94 02/14/2017   CREATININE 0.90 08/14/2016   CREATININE 1.04 12/24/2015    Lab Results  Component Value Date   WBC 6.5 02/14/2017   HGB 15.7 02/14/2017   HCT 46.1 02/14/2017   PLT 181.0 02/14/2017   GLUCOSE 101 (H) 02/14/2017   CHOL 125 02/14/2017   TRIG 66.0 02/14/2017   HDL 57.30 02/14/2017   LDLDIRECT 47.0 12/24/2015   LDLCALC 54 02/14/2017   ALT 17 02/14/2017   AST 16 02/14/2017   NA 141 02/14/2017   K 4.3 02/14/2017   CL 107 02/14/2017   CREATININE 0.94 02/14/2017   BUN 18 02/14/2017   CO2 30 02/14/2017   TSH 2.31 02/14/2017   PSA 0.86 11/03/2015   HGBA1C 5.7 02/14/2017    No results found.  Assessment & Plan:   Problem List Items Addressed This Visit    Chronic midline low back pain without sciatica - Primary    Plain films ordered   Trial of diclofenac and tylenol.      Relevant Orders   DG Lumbar Spine Complete   History of vertebral compression fracture    Has been taking alendronate since 2014.  Will continue until 2019  Repeat DEXA and take a one year drug holiday .  treating current pain with diclofenac ant tylenol       HLD (hyperlipidemia)    Advised to resume lipitor in 2 weeks after fasting lipids have been done .  Advised to resume along with co Q 10 given CT findings of atherosclerosis with ulcerated placques.   Lab Results  Component Value Date   CHOL 125 02/14/2017   HDL 57.30 02/14/2017   LDLCALC 54 02/14/2017   LDLDIRECT 47.0 12/24/2015   TRIG 66.0 02/14/2017   CHOLHDL 2 02/14/2017   Lab Results  Component Value Date   ALT 17 02/14/2017   AST 16 02/14/2017   ALKPHOS 40 02/14/2017    BILITOT 1.5 (H) 02/14/2017         Relevant Orders   Lipid panel   Impaired fasting glucose    He has lowered his a1c to a normal range by losing weight and reducing the sugar in his diet. Will repeat every 6 months   Lab Results  Component Value Date   HGBA1C 5.7 02/14/2017         Relevant Orders   Hemoglobin A1c   Tubular adenoma of colon    By 2018 colonoscopy  Repeat in 5 yrs.        Other Visit Diagnoses    Vitamin D deficiency  Relevant Orders   VITAMIN D 25 Hydroxy (Vit-D Deficiency, Fractures)   Long-term use of high-risk medication       Relevant Orders   Comprehensive metabolic panel      I have discontinued Mr. Kelly [facility] OMEGA-3. I am also having him maintain his Calcium Citrate-Vitamin D (CITRACAL + D PO), Vitamin D3, alendronate, famotidine, aspirin EC, fluticasone, diclofenac, docusate sodium, and atorvastatin.  No orders of the defined types were placed in this encounter.   Medications Discontinued During This Encounter  Medication Reason  . Flax Oil-Fish Oil-Borage Oil ([facility]S OMEGA-3) CAPS Patient has not taken in last 30 days    Follow-up: Return in about 2 weeks (around 08/31/2017) for fasting labs.  OV 6 months .   Crecencio Mc, MD

## 2017-08-18 ENCOUNTER — Other Ambulatory Visit: Payer: Self-pay | Admitting: Internal Medicine

## 2017-08-19 DIAGNOSIS — Z8781 Personal history of (healed) traumatic fracture: Secondary | ICD-10-CM | POA: Insufficient documentation

## 2017-08-19 NOTE — Assessment & Plan Note (Addendum)
Has been taking alendronate since 2014.  Will continue until 2019  Repeat DEXA and take a one year drug holiday .  treating current pain with diclofenac ant tylenol

## 2017-08-19 NOTE — Assessment & Plan Note (Signed)
He has lowered his a1c to a normal range by losing weight and reducing the sugar in his diet. Will repeat every 6 months   Lab Results  Component Value Date   HGBA1C 5.7 02/14/2017

## 2017-08-19 NOTE — Assessment & Plan Note (Addendum)
Advised to resume lipitor in 2 weeks after fasting lipids have been done .  Advised to resume along with co Q 10 given CT findings of atherosclerosis with ulcerated placques.   Lab Results  Component Value Date   CHOL 125 02/14/2017   HDL 57.30 02/14/2017   LDLCALC 54 02/14/2017   LDLDIRECT 47.0 12/24/2015   TRIG 66.0 02/14/2017   CHOLHDL 2 02/14/2017   Lab Results  Component Value Date   ALT 17 02/14/2017   AST 16 02/14/2017   ALKPHOS 40 02/14/2017   BILITOT 1.5 (H) 02/14/2017

## 2017-08-19 NOTE — Assessment & Plan Note (Signed)
By 2018 colonoscopy  Repeat in 5 yrs.

## 2017-08-19 NOTE — Assessment & Plan Note (Signed)
Plain films ordered   Trial of diclofenac and tylenol.

## 2017-08-31 ENCOUNTER — Other Ambulatory Visit (INDEPENDENT_AMBULATORY_CARE_PROVIDER_SITE_OTHER): Payer: Medicare Other

## 2017-08-31 DIAGNOSIS — R7301 Impaired fasting glucose: Secondary | ICD-10-CM | POA: Diagnosis not present

## 2017-08-31 DIAGNOSIS — E7800 Pure hypercholesterolemia, unspecified: Secondary | ICD-10-CM | POA: Diagnosis not present

## 2017-08-31 DIAGNOSIS — Z79899 Other long term (current) drug therapy: Secondary | ICD-10-CM

## 2017-08-31 DIAGNOSIS — E559 Vitamin D deficiency, unspecified: Secondary | ICD-10-CM | POA: Diagnosis not present

## 2017-08-31 LAB — COMPREHENSIVE METABOLIC PANEL
ALBUMIN: 4.6 g/dL (ref 3.5–5.2)
ALT: 15 U/L (ref 0–53)
AST: 16 U/L (ref 0–37)
Alkaline Phosphatase: 40 U/L (ref 39–117)
BUN: 18 mg/dL (ref 6–23)
CALCIUM: 9.9 mg/dL (ref 8.4–10.5)
CHLORIDE: 103 meq/L (ref 96–112)
CO2: 30 mEq/L (ref 19–32)
CREATININE: 0.99 mg/dL (ref 0.40–1.50)
GFR: 77.83 mL/min (ref >60.00)
Glucose, Bld: 104 mg/dL — ABNORMAL HIGH (ref 70–99)
Potassium: 4.6 mEq/L (ref 3.5–5.1)
SODIUM: 140 meq/L (ref 135–145)
Total Bilirubin: 1.4 mg/dL — ABNORMAL HIGH (ref 0.2–1.2)
Total Protein: 7 g/dL (ref 6.0–8.3)

## 2017-08-31 LAB — LIPID PANEL
CHOLESTEROL: 192 mg/dL (ref 0–200)
HDL: 60.3 mg/dL (ref >39.00)
LDL CALC: 111 mg/dL — AB (ref 0–99)
NonHDL: 131.83
TRIGLYCERIDES: 102 mg/dL (ref 0.0–149.0)
Total CHOL/HDL Ratio: 3
VLDL: 20.4 mg/dL (ref 0.0–40.0)

## 2017-08-31 LAB — HEMOGLOBIN A1C: HEMOGLOBIN A1C: 5.7 % (ref 4.6–6.5)

## 2017-08-31 LAB — VITAMIN D 25 HYDROXY (VIT D DEFICIENCY, FRACTURES): VITD: 36.91 ng/mL (ref 30.00–100.00)

## 2017-10-09 ENCOUNTER — Ambulatory Visit (INDEPENDENT_AMBULATORY_CARE_PROVIDER_SITE_OTHER): Payer: Medicare Other | Admitting: Vascular Surgery

## 2017-10-09 ENCOUNTER — Encounter (INDEPENDENT_AMBULATORY_CARE_PROVIDER_SITE_OTHER): Payer: Self-pay | Admitting: Vascular Surgery

## 2017-10-09 ENCOUNTER — Ambulatory Visit (INDEPENDENT_AMBULATORY_CARE_PROVIDER_SITE_OTHER): Payer: Medicare Other

## 2017-10-09 VITALS — BP 129/78 | HR 51 | Resp 16 | Ht 69.0 in | Wt 200.0 lb

## 2017-10-09 DIAGNOSIS — I714 Abdominal aortic aneurysm, without rupture, unspecified: Secondary | ICD-10-CM

## 2017-10-09 DIAGNOSIS — E7800 Pure hypercholesterolemia, unspecified: Secondary | ICD-10-CM

## 2017-10-09 NOTE — Progress Notes (Signed)
MRN : [id number]  Jamie Zhang is a 76 y.o. (11-Nov-1941) male who presents with chief complaint of  Chief Complaint  Patient presents with  . AAA    20yr follow up  .  History of Present Illness: Patient returns today in follow up of AAA.  He is doing well without complaints today.  He denies any aneurysm related symptoms. Specifically, the patient denies new back or abdominal pain, or signs of peripheral embolization.  His aortic duplex today does show some slight growth of his abdominal aortic aneurysm now measuring 3.8 cm in maximal diameter.  This previously measured 3.5 cm in maximal diameter 1 year ago.  Current Outpatient Medications  Medication Sig Dispense Refill  . alendronate (FOSAMAX) 70 MG tablet TAKE 1 TABLET ONE TIME A WEEK WITH A FULL GLASS OF WATER ON AN EMPTY STOMACH. 12 tablet 5  . aspirin EC 81 MG tablet Take 81 mg by mouth daily.    [name] atorvastatin (LIPITOR) 20 MG tablet TAKE 1 TABLET EVERY DAY 90 tablet 1  . Calcium Citrate-Vitamin D (CITRACAL + D PO) Take 650 mg by mouth daily. 2 tablets daily    . Cholecalciferol (VITAMIN D3) 10000 UNITS TABS Take 1 tablet by mouth daily.    . diclofenac (VOLTAREN) 75 MG EC tablet Take 1 tablet (75 mg total) by mouth 2 (two) times daily. 60 tablet 2  . docusate sodium (STOOL SOFTENER) 100 MG capsule Take 100 mg by mouth 2 (two) times daily.    . famotidine (PEPCID) 40 MG tablet Take 1 tablet (40 mg total) by mouth daily. 90 tablet 1  . fluticasone (FLONASE) 50 MCG/ACT nasal spray USE 2 SPRAYS IN EACH NOSTRIL EVERY DAY 48 g 2   No current facility-administered medications for this visit.     Past Medical History:  Diagnosis Date  . Arthritis   . Cataract 01/02/2011   Bilateral - s/p excision  . Colon polyps 2000   Hyperplastic - Repeat colonoscopy 2015  . GERD (gastroesophageal reflux disease)    Pantoprazole  . Hemorrhoids 2006  . HLD (hyperlipidemia)   . Iron deficiency anemia 03/20/2006   Donating too much blood  .  Migraine    No longer has problems  . Osteopenia    on Fosamax  . RLS (restless legs syndrome) 03/20/2006   2/2 iron deficiency  . Schatzki's ring   . Skin cancer 01/04/2012   Basal cell nose, squamous cell scalp - Dr. Ula Lingo    Past Surgical History:  Procedure Laterality Date  . BREAST BIOPSY Left    benign  . Cataract Surgery Bilateral   . COLONOSCOPY WITH PROPOFOL N/A 04/18/2017   Procedure: COLONOSCOPY WITH PROPOFOL;  Surgeon: Robert Bellow, MD;  Location: [facility] ENDOSCOPY;  Service: Endoscopy;  Laterality: N/A;  . INGUINAL HERNIA REPAIR Left   . ROTATOR CUFF REPAIR Left      Social History       Social History  Substance Use Topics  . Smoking status: Former Smoker    Packs/day: 2.00    Years: 21.00    Types: Cigarettes  . Smokeless tobacco: Not on file  . Alcohol use No     Comment: Occasional wine glass     Family History       Family History  Problem Relation Age of Onset  . Alcohol abuse Mother   . Cancer Father 63    Throat Cancer - died  . Alcohol abuse Father   . Cancer  Sister 73    Breast Cancer     No Known Allergies   REVIEW OF SYSTEMS (Negative unless checked)  Constitutional: [] Weight loss  [] Fever  [] Chills Cardiac: [] Chest pain   [] Chest pressure   [] Palpitations   [] Shortness of breath when laying flat   [] Shortness of breath at rest   [] Shortness of breath with exertion. Vascular:  [] Pain in legs with walking   [] Pain in legs at rest   [] Pain in legs when laying flat   [] Claudication   [] Pain in feet when walking  [] Pain in feet at rest  [] Pain in feet when laying flat   [] History of DVT   [] Phlebitis   [] Swelling in legs   [] Varicose veins   [] Non-healing ulcers Pulmonary:   [] Uses home oxygen   [] Productive cough   [] Hemoptysis   [] Wheeze  [] COPD   [] Asthma Neurologic:  [] Dizziness  [] Blackouts   [] Seizures   [] History of stroke   [] History of TIA  [] Aphasia   [] Temporary blindness   [] Dysphagia   [] Weakness or  numbness in arms   [] Weakness or numbness in legs Musculoskeletal:  [x] Arthritis   [] Joint swelling   [] Joint pain   [x] Low back pain Hematologic:  [] Easy bruising  [] Easy bleeding   [] Hypercoagulable state   [] Anemic   Gastrointestinal:  [] Blood in stool   [] Vomiting blood  [x] Gastroesophageal reflux/heartburn   [] Abdominal pain Genitourinary:  [] Chronic kidney disease   [] Difficult urination  [] Frequent urination  [] Burning with urination   [] Hematuria Skin:  [] Rashes   [] Ulcers   [] Wounds Psychological:  [] History of anxiety   []  History of major depression.      Physical Examination  BP 129/78 (BP Location: Right Arm)   Pulse (!) 51   Resp 16   Ht 5\' 9"  (1.753 m)   Wt 200 lb (90.7 kg)   BMI 29.53 kg/m  Gen:  WD/WN, NAD.  Appears younger than stated age Head: [place]/AT, No temporalis wasting. Ear/Nose/Throat: Hearing grossly intact, nares w/o erythema or drainage, trachea midline Eyes: Conjunctiva clear. Sclera non-icteric Neck: Supple.  No JVD.  Pulmonary:  Good air movement, no use of accessory muscles.  Cardiac: RRR, normal S1, S2 Vascular:  Vessel Right Left  Radial Palpable Palpable                                   Gastrointestinal: soft, non-tender/non-distended. Nonpalpable aorta Musculoskeletal: M/S 5/5 throughout.  No deformity or atrophy.  No edema. Neurologic: Sensation grossly intact in extremities.  Symmetrical.  Speech is fluent.  Psychiatric: Judgment intact, Mood & affect appropriate for pt's clinical situation. Dermatologic: No rashes or ulcers noted.  No cellulitis or open wounds.       Labs Recent Results (from the past 2160 hour(s))  Lipid panel     Status: Abnormal   Collection Time: 08/31/17  9:23 AM  Result Value Ref Range   Cholesterol 192 0 - 200 mg/dL    Comment: ATP III Classification       Desirable:  < 200 mg/dL               Borderline High:  200 - 239 mg/dL          High:  > = 240 mg/dL   Triglycerides 102.0 0.0 - 149.0 mg/dL      Comment: Normal:  <150 mg/dLBorderline High:  150 - 199 mg/dL   HDL 60.30 >39.00 mg/dL  VLDL 20.4 0.0 - 40.0 mg/dL   LDL Cholesterol 111 (H) 0 - 99 mg/dL   Total CHOL/HDL Ratio 3     Comment:                Men          Women1/2 Average Risk     3.4          3.3Average Risk          5.0          4.42X Average Risk          9.6          7.13X Average Risk          15.0          11.0                       NonHDL 131.83     Comment: NOTE:  Non-HDL goal should be 30 mg/dL higher than patient's LDL goal (i.e. LDL goal of < 70 mg/dL, would have non-HDL goal of < 100 mg/dL)  Comprehensive metabolic panel     Status: Abnormal   Collection Time: 08/31/17  9:23 AM  Result Value Ref Range   Sodium 140 135 - 145 mEq/L   Potassium 4.6 3.5 - 5.1 mEq/L   Chloride 103 96 - 112 mEq/L   CO2 30 19 - 32 mEq/L   Glucose, Bld 104 (H) 70 - 99 mg/dL   BUN 18 6 - 23 mg/dL   Creatinine, Ser 0.99 0.40 - 1.50 mg/dL   Total Bilirubin 1.4 (H) 0.2 - 1.2 mg/dL   Alkaline Phosphatase 40 39 - 117 U/L   AST 16 0 - 37 U/L   ALT 15 0 - 53 U/L   Total Protein 7.0 6.0 - 8.3 g/dL   Albumin 4.6 3.5 - 5.2 g/dL   Calcium 9.9 8.4 - 10.5 mg/dL   GFR 77.83 >60.00 mL/min  VITAMIN D 25 Hydroxy (Vit-D Deficiency, Fractures)     Status: None   Collection Time: 08/31/17  9:23 AM  Result Value Ref Range   VITD 36.91 30.00 - 100.00 ng/mL  Hemoglobin A1c     Status: None   Collection Time: 08/31/17  9:23 AM  Result Value Ref Range   Hgb A1c MFr Bld 5.7 4.6 - 6.5 %    Comment: Glycemic Control Guidelines for People with Diabetes:Non Diabetic:  <6%Goal of Therapy: <7%Additional Action Suggested:  >8%     Radiology No results found.    Assessment/Plan HLD (hyperlipidemia) lipid control important in reducing the progression of atherosclerotic disease. Continue statin therapy   Abdominal aortic aneurysm His aortic duplex today does show some slight growth of his abdominal aortic aneurysm now measuring 3.8 cm in maximal  diameter.  This previously measured 3.5 cm in maximal diameter 1 year ago. Continue to monitor blood pressure and abstain from tobacco.  Recheck in 1 year.    Leotis Pain, MD  10/09/2017 9:44 AM    This note was created with Dragon medical transcription system.  Any errors from dictation are purely unintentional

## 2017-10-09 NOTE — Patient Instructions (Signed)
Abdominal Aortic Aneurysm Blood pumps away from the heart through tubes (blood vessels) called arteries. Aneurysms are weak or damaged places in the wall of an artery. It bulges out like a balloon. An abdominal aortic aneurysm happens in the main artery of the body (aorta). It can burst or tear, causing bleeding inside the body. This is an emergency. It needs treatment right away. What are the causes? The exact cause is unknown. Things that could cause this problem include:  Fat and other substances building up in the lining of a tube.  Swelling of the walls of a blood vessel.  Certain tissue diseases.  Belly (abdominal) trauma.  An infection in the main artery of the body.  What increases the risk? There are things that make it more likely for you to have an aneurysm. These include:  Being over the age of 77 years old.  Having high blood pressure (hypertension).  Being a male.  Being white.  Being very overweight (obese).  Having a family history of aneurysm.  Using tobacco products.  What are the signs or symptoms? Symptoms depend on the size of the aneurysm and how fast it grows. There may not be symptoms. If symptoms occur, they can include:  Pain (belly, side, lower back, or groin).  Feeling full after eating a small amount of food.  Feeling sick to your stomach (nauseous), throwing up (vomiting), or both.  Feeling a lump in your belly that feels like it is beating (pulsating).  Feeling like you will pass out (faint).  How is this treated?  Medicine to control blood pressure and pain.  Imaging tests to see if the aneurysm gets bigger.  Surgery. How is this prevented? To lessen your chance of getting this condition:  Stop smoking. Stop chewing tobacco.  Limit or avoid alcohol.  Keep your blood pressure, blood sugar, and cholesterol within normal limits.  Eat less salt.  Eat foods low in saturated fats and cholesterol. These are found in animal and  whole dairy products.  Eat more fiber. Fiber is found in whole grains, vegetables, and fruits.  Keep a healthy weight.  Stay active and exercise often.  This information is not intended to replace advice given to you by your health care provider. Make sure you discuss any questions you have with your health care provider. Document Released: 02/24/2013 Document Revised: 04/06/2016 Document Reviewed: 11/29/2012 Elsevier Interactive Patient Education  2017 Elsevier Inc.  

## 2017-10-09 NOTE — Assessment & Plan Note (Signed)
His aortic duplex today does show some slight growth of his abdominal aortic aneurysm now measuring 3.8 cm in maximal diameter.  This previously measured 3.5 cm in maximal diameter 1 year ago. Continue to monitor blood pressure and abstain from tobacco.  Recheck in 1 year.

## 2017-11-02 ENCOUNTER — Ambulatory Visit (INDEPENDENT_AMBULATORY_CARE_PROVIDER_SITE_OTHER): Payer: Medicare Other

## 2017-11-02 VITALS — BP 110/70 | HR 67 | Temp 97.9°F | Resp 15 | Ht 69.0 in | Wt 199.8 lb

## 2017-11-02 DIAGNOSIS — Z1331 Encounter for screening for depression: Secondary | ICD-10-CM | POA: Diagnosis not present

## 2017-11-02 DIAGNOSIS — Z0000 Encounter for general adult medical examination without abnormal findings: Secondary | ICD-10-CM

## 2017-11-02 NOTE — Progress Notes (Signed)
Subjective:   Jamie Zhang is a 76 y.o. male who presents for Medicare Annual/Subsequent preventive examination.  Review of Systems:  No ROS.  Medicare Wellness Visit. Additional risk factors are reflected in the social history.  Cardiac Risk Factors include: advanced age (>47men, >33 women);male gender     Objective:    Vitals: BP 110/70 (BP Location: Left Arm, Patient Position: Sitting, Cuff Size: Normal)   Pulse 67   Temp 97.9 F (36.6 C) (Oral)   Resp 15   Ht 5\' 9"  (1.753 m)   Wt 199 lb 12.8 oz (90.6 kg)   SpO2 95%   BMI 29.51 kg/m   Body mass index is 29.51 kg/m.  Advanced Directives 11/02/2017 04/18/2017 11/02/2016 10/03/2016  Does Patient Have a Medical Advance Directive? Yes Yes Yes No  Type of Advance Directive Living will;Healthcare Power of Attorney Living will [place];Living will -  Does patient want to make changes to medical advance directive? No - Patient declined - No - Patient declined -  Copy of Jamie Zhang in Chart? Yes - No - copy requested -    Tobacco Social History   Tobacco Use  Smoking Status Former Smoker  . Packs/day: 2.00  . Years: 21.00  . Pack years: 42.00  . Types: Cigarettes  Smokeless Tobacco Never Used     Counseling given: Not Answered   Clinical Intake:  Pre-visit preparation completed: Yes  Pain : No/denies pain     Nutritional Status: BMI 25 -29 Overweight Diabetes: No  How often do you need to have someone help you when you read instructions, pamphlets, or other written materials from your doctor or pharmacy?: 1 - Never  Interpreter Needed?: No     Past Medical History:  Diagnosis Date  . Arthritis   . Cataract 01/02/2011   Bilateral - s/p excision  . Colon polyps 2000   Hyperplastic - Repeat colonoscopy 2015  . GERD (gastroesophageal reflux disease)    Pantoprazole  . Hemorrhoids 2006  . HLD (hyperlipidemia)   . Iron deficiency anemia 03/20/2006   Donating too much  blood  . Migraine    No longer has problems  . Osteopenia    on Fosamax  . RLS (restless legs syndrome) 03/20/2006   2/2 iron deficiency  . Schatzki's ring   . Skin cancer 01/04/2012   Basal cell nose, squamous cell scalp - Dr. Ula Lingo   Past Surgical History:  Procedure Laterality Date  . BREAST BIOPSY Left    benign  . Cataract Surgery Bilateral   . COLONOSCOPY WITH PROPOFOL N/A 04/18/2017   Procedure: COLONOSCOPY WITH PROPOFOL;  Surgeon: Robert Bellow, MD;  Location: [facility] ENDOSCOPY;  Service: Endoscopy;  Laterality: N/A;  . INGUINAL HERNIA REPAIR Left   . ROTATOR CUFF REPAIR Left    Family History  Problem Relation Age of Onset  . Alcohol abuse Mother   . Arthritis Mother   . Hypertension Mother   . Cancer Father 88       Throat Cancer - died  . Alcohol abuse Father   . Cancer Sister 66       Breast Cancer  . Alcohol abuse Sister    Social History   Socioeconomic History  . Marital status: Married    Spouse name: None  . Number of children: 2  . Years of education: 79  . Highest education level: None  Social Needs  . Financial resource strain: None  . Food insecurity -  worry: None  . Food insecurity - inability: None  . Transportation needs - medical: None  . Transportation needs - non-medical: None  Occupational History  . Occupation: Freight forwarder    Comment: Worked for [employer]  . Occupation: Personal assistant  . Occupation: Freight forwarder for [employer]  Tobacco Use  . Smoking status: Former Smoker    Packs/day: 2.00    Years: 21.00    Pack years: 42.00    Types: Cigarettes  . Smokeless tobacco: Never Used  Substance and Sexual Activity  . Alcohol use: Yes    Comment: Occasional wine glass  . Drug use: No  . Sexual activity: No  Other Topics Concern  . None  Social History Narrative   Jamie Zhang grew up in [place], [place]. He joined the [employer] right out of [employer] and served for 2.5 years then was active in [employer]. 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 He then worked in [employer] and afterward became a Freight forwarder for [employer]. He recently moved to [place] with his wife. They have two daughters that are still living in [place]. As a hobby he enjoys woodwork mainly with cabinet work.     Outpatient Encounter Medications as of 11/02/2017  Medication Sig  . alendronate (FOSAMAX) 70 MG tablet TAKE 1 TABLET ONE TIME A WEEK WITH A FULL GLASS OF WATER ON AN EMPTY STOMACH.  [name] aspirin EC 81 MG tablet Take 81 mg by mouth daily.  [name] atorvastatin (LIPITOR) 20 MG tablet TAKE 1 TABLET EVERY DAY  . Calcium Citrate-Vitamin D (CITRACAL + D PO) Take 650 mg by mouth daily. 2 tablets daily  . Cholecalciferol (VITAMIN D3) 10000 UNITS TABS Take 1 tablet by mouth daily.  . diclofenac (VOLTAREN) 75 MG EC tablet Take 1 tablet (75 mg total) by mouth 2 (two) times daily.  [name] docusate sodium (STOOL SOFTENER) 100 MG capsule Take 100 mg by mouth 2 (two) times daily.  . famotidine (PEPCID) 40 MG tablet Take 1 tablet (40 mg total) by mouth daily.  . fluticasone (FLONASE) 50 MCG/ACT nasal spray USE 2 SPRAYS IN EACH NOSTRIL EVERY DAY   No facility-administered encounter medications on file as of 11/02/2017.     Activities of Daily Living In your present state of health, do you have any difficulty performing the following activities: 11/02/2017 11/02/2016  Hearing? N N  Vision? N N  Difficulty concentrating or making decisions? N N  Walking or climbing stairs? N N  Dressing or bathing? N N  Doing errands, shopping? N N  Preparing Food and eating ? N N  Using the Toilet? N N  In the past six months, have you accidently leaked urine? N N  Do you have problems with loss of bowel control? N N  Managing your Medications? N N  Managing your Finances? N N  Housekeeping or managing your Housekeeping? N N  Some recent data might be hidden    Patient Care Team: Crecencio Mc, MD as PCP -  General (Internal Medicine) Crecencio Mc, MD (Internal Medicine) Bary Castilla Forest Gleason, MD (General Surgery)   Assessment:   This is a routine wellness examination for Jamie Zhang.  The goal of the wellness visit is to assist the patient how to close the gaps in care and create a preventative care plan for the patient.   The roster of all physicians providing medical care to patient is listed  in the Snapshot section of the chart.  Taking calcium VIT D3 as appropriate/Osteoporosis risk reviewed.    Safety issues reviewed; Smoke and carbon monoxide detectors in the home. No firearms in the home.  Wears seatbelts when driving or riding with others. Patient does wear sunscreen or protective clothing when in direct sunlight. No violence in the home.  Depression- PHQ 2 &9 complete.  No signs/symptoms or verbal communication regarding little pleasure in doing things, feeling down, depressed or hopeless. No changes in sleeping, energy, eating, concentrating.  No thoughts of self harm or harm towards others.  Time spent on this topic is 9 minutes.   Patient is alert, normal appearance, oriented to person/place/and time. Correctly identified the president of the [place], recall of 3/3 words, and performing simple calculations. Displays appropriate judgement and can read correct time from watch face.   No new identified risk were noted.  No failures at ADL's or IADL's.    BMI- discussed the importance of a healthy diet, water intake and the benefits of aerobic exercise. Educational material provided.   24 hour diet recall: Breakfast: yogurt, fruit Lunch: sandwich Dinner: meal plan with [place] Snack: ice cream Daily fluid intake: 1 cups of caffeine, 4 cups of water  Dental- every 6 months.  Dr. Sherril Cong.  Eye- Visual acuity not assessed per patient preference since they have regular follow up with the ophthalmologist.  Wears corrective lenses.  Sleep patterns- Sleeps 6 hours at night.  Nocturia  x2-4.  Health maintenance gaps- closed.  Patient Concerns: Right thumb joint pain, intermittent. Appointment offered; declined.  Encouraged to follow up with PCP if symptoms worsen and as needed.  Exercise Activities and Dietary recommendations Current Exercise Habits: Home exercise routine, Type of exercise: treadmill, Time (Minutes): 30, Frequency (Times/Week): 6, Weekly Exercise (Minutes/Week): 180, Intensity: Moderate  Goals    . Healthy Lifestyle     Stay hydrated Exercise Low carb foods       Fall Risk Fall Risk  11/02/2017 11/02/2016 10/19/2016 11/03/2015 05/04/2015  Falls in the past year? No No No No No  Comment - - Emmi Telephone Survey: data to providers prior to load - -   Depression Screen PHQ 2/9 Scores 11/02/2017 11/02/2016 11/03/2015 05/04/2015  PHQ - 2 Score 0 0 0 0    Cognitive Function MMSE - Mini Mental State Exam 11/02/2016  Orientation to time 5  Orientation to Place 5  Registration 3  Attention/ Calculation 5  Recall 3  Language- name 2 objects 2  Language- repeat 1  Language- follow 3 step command 3  Language- read & follow direction 1  Write a sentence 1  Copy design 1  Total score 30     6CIT Screen 11/02/2017  What Year? 0 points  What month? 0 points  What time? 0 points  Count back from 20 0 points  Months in reverse 0 points  Repeat phrase 0 points  Total Score 0    Immunization History  Administered Date(s) Administered  . Influenza Split 09/01/2015  . Influenza, High Dose Seasonal PF 08/14/2016  . Influenza-Unspecified 08/25/2014, 08/01/2017  . Pneumococcal Conjugate-13 05/04/2015  . Pneumococcal Polysaccharide-23 09/19/2005  . Tdap 08/08/2010   Screening Tests Health Maintenance  Topic Date Due  . COLONOSCOPY  04/18/2020  . TETANUS/TDAP  08/08/2020  . INFLUENZA VACCINE  Completed  . PNA vac Low Risk Adult  Completed      Plan:    End of life planning; Advance aging; Advanced directives discussed. Copy  of current  HCPOA/Living Will on file.    I have personally reviewed and noted the following in the patient's chart:   . Medical and social history . Use of alcohol, tobacco or illicit drugs  . Current medications and supplements . Functional ability and status . Nutritional status . Physical activity . Advanced directives . List of other physicians . Hospitalizations, surgeries, and ER visits in previous 12 months . Vitals . Screenings to include cognitive, depression, and falls . Referrals and appointments  In addition, I have reviewed and discussed with patient certain preventive protocols, quality metrics, and best practice recommendations. A written personalized care plan for preventive services as well as general preventive health recommendations were provided to patient.     OBrien-Blaney, Dima Mini L, LPN  [id number]   I have reviewed the above information and agree with above.   Deborra Medina, MD

## 2017-11-02 NOTE — Patient Instructions (Addendum)
  Jamie Zhang , Thank you for taking time to come for your Medicare Wellness Visit. I appreciate your ongoing commitment to your health goals. Please review the following plan we discussed and let me know if I can assist you in the future.   Follow up with Dr. Derrel Nip as needed.    Have a great day and Merry Christmas!  These are the goals we discussed: Goals    . Healthy Lifestyle     Stay hydrated Exercise Low carb foods       This is a list of the screening recommended for you and due dates:  Health Maintenance  Topic Date Due  . Colon Cancer Screening  04/18/2020  . Tetanus Vaccine  08/08/2020  . Flu Shot  Completed  . Pneumonia vaccines  Completed

## 2017-12-11 ENCOUNTER — Other Ambulatory Visit: Payer: Self-pay | Admitting: Internal Medicine

## 2017-12-26 DIAGNOSIS — C44519 Basal cell carcinoma of skin of other part of trunk: Secondary | ICD-10-CM | POA: Diagnosis not present

## 2017-12-26 DIAGNOSIS — X32XXXA Exposure to sunlight, initial encounter: Secondary | ICD-10-CM | POA: Diagnosis not present

## 2017-12-26 DIAGNOSIS — L570 Actinic keratosis: Secondary | ICD-10-CM | POA: Diagnosis not present

## 2017-12-26 DIAGNOSIS — D2272 Melanocytic nevi of left lower limb, including hip: Secondary | ICD-10-CM | POA: Diagnosis not present

## 2017-12-26 DIAGNOSIS — D2261 Melanocytic nevi of right upper limb, including shoulder: Secondary | ICD-10-CM | POA: Diagnosis not present

## 2017-12-26 DIAGNOSIS — D0462 Carcinoma in situ of skin of left upper limb, including shoulder: Secondary | ICD-10-CM | POA: Diagnosis not present

## 2017-12-26 DIAGNOSIS — Z85828 Personal history of other malignant neoplasm of skin: Secondary | ICD-10-CM | POA: Diagnosis not present

## 2017-12-26 DIAGNOSIS — D225 Melanocytic nevi of trunk: Secondary | ICD-10-CM | POA: Diagnosis not present

## 2017-12-26 DIAGNOSIS — D485 Neoplasm of uncertain behavior of skin: Secondary | ICD-10-CM | POA: Diagnosis not present

## 2017-12-27 DIAGNOSIS — M1811 Unilateral primary osteoarthritis of first carpometacarpal joint, right hand: Secondary | ICD-10-CM | POA: Diagnosis not present

## 2018-02-11 DIAGNOSIS — D0462 Carcinoma in situ of skin of left upper limb, including shoulder: Secondary | ICD-10-CM | POA: Diagnosis not present

## 2018-02-11 DIAGNOSIS — C44629 Squamous cell carcinoma of skin of left upper limb, including shoulder: Secondary | ICD-10-CM | POA: Diagnosis not present

## 2018-02-15 ENCOUNTER — Encounter: Payer: Self-pay | Admitting: Internal Medicine

## 2018-02-15 ENCOUNTER — Ambulatory Visit (INDEPENDENT_AMBULATORY_CARE_PROVIDER_SITE_OTHER): Payer: Medicare Other | Admitting: Internal Medicine

## 2018-02-15 VITALS — BP 110/72 | HR 48 | Temp 97.4°F | Resp 14 | Ht 69.0 in | Wt 201.0 lb

## 2018-02-15 DIAGNOSIS — I700 Atherosclerosis of aorta: Secondary | ICD-10-CM | POA: Diagnosis not present

## 2018-02-15 DIAGNOSIS — E559 Vitamin D deficiency, unspecified: Secondary | ICD-10-CM

## 2018-02-15 DIAGNOSIS — Z79899 Other long term (current) drug therapy: Secondary | ICD-10-CM

## 2018-02-15 DIAGNOSIS — Z8781 Personal history of (healed) traumatic fracture: Secondary | ICD-10-CM

## 2018-02-15 DIAGNOSIS — M810 Age-related osteoporosis without current pathological fracture: Secondary | ICD-10-CM | POA: Diagnosis not present

## 2018-02-15 DIAGNOSIS — I714 Abdominal aortic aneurysm, without rupture, unspecified: Secondary | ICD-10-CM

## 2018-02-15 DIAGNOSIS — R7301 Impaired fasting glucose: Secondary | ICD-10-CM | POA: Diagnosis not present

## 2018-02-15 DIAGNOSIS — E785 Hyperlipidemia, unspecified: Secondary | ICD-10-CM

## 2018-02-15 LAB — CBC WITH DIFFERENTIAL/PLATELET
Basophils Absolute: 0 10*3/uL (ref 0.0–0.1)
Basophils Relative: 0.7 % (ref 0.0–3.0)
Eosinophils Absolute: 0.1 10*3/uL (ref 0.0–0.7)
Eosinophils Relative: 1.8 % (ref 0.0–5.0)
HEMATOCRIT: 45.2 % (ref 39.0–52.0)
HEMOGLOBIN: 15.7 g/dL (ref 13.0–17.0)
LYMPHS PCT: 18.3 % (ref 12.0–46.0)
Lymphs Abs: 1.2 10*3/uL (ref 0.7–4.0)
MCHC: 34.8 g/dL (ref 30.0–36.0)
MCV: 92.1 fl (ref 78.0–100.0)
MONO ABS: 0.5 10*3/uL (ref 0.1–1.0)
MONOS PCT: 8.1 % (ref 3.0–12.0)
Neutro Abs: 4.8 10*3/uL (ref 1.4–7.7)
Neutrophils Relative %: 71.1 % (ref 43.0–77.0)
Platelets: 177 10*3/uL (ref 150.0–400.0)
RBC: 4.91 Mil/uL (ref 4.22–5.81)
RDW: 14.3 % (ref 11.5–15.5)
WBC: 6.7 10*3/uL (ref 4.0–10.5)

## 2018-02-15 LAB — COMPREHENSIVE METABOLIC PANEL
ALBUMIN: 4.3 g/dL (ref 3.5–5.2)
ALK PHOS: 39 U/L (ref 39–117)
ALT: 22 U/L (ref 0–53)
AST: 19 U/L (ref 0–37)
BILIRUBIN TOTAL: 1.4 mg/dL — AB (ref 0.2–1.2)
BUN: 21 mg/dL (ref 6–23)
CO2: 28 mEq/L (ref 19–32)
CREATININE: 0.93 mg/dL (ref 0.40–1.50)
Calcium: 9.3 mg/dL (ref 8.4–10.5)
Chloride: 106 mEq/L (ref 96–112)
GFR: 83.55 mL/min (ref >60.00)
GLUCOSE: 96 mg/dL (ref 70–99)
Potassium: 4.4 mEq/L (ref 3.5–5.1)
SODIUM: 140 meq/L (ref 135–145)
TOTAL PROTEIN: 6.7 g/dL (ref 6.0–8.3)

## 2018-02-15 LAB — LIPID PANEL
Cholesterol: 120 mg/dL (ref 0–200)
HDL: 55.8 mg/dL (ref >39.00)
LDL Cholesterol: 50 mg/dL (ref 0–99)
NONHDL: 64.48
Total CHOL/HDL Ratio: 2
Triglycerides: 74 mg/dL (ref 0.0–149.0)
VLDL: 14.8 mg/dL (ref 0.0–40.0)

## 2018-02-15 LAB — HEMOGLOBIN A1C: HEMOGLOBIN A1C: 5.8 % (ref 4.6–6.5)

## 2018-02-15 LAB — VITAMIN D 25 HYDROXY (VIT D DEFICIENCY, FRACTURES): VITD: 36.12 ng/mL (ref 30.00–100.00)

## 2018-02-15 MED ORDER — ZOSTER VAC RECOMB ADJUVANTED 50 MCG/0.5ML IM SUSR: 0.5000 mL | Freq: Once | INTRAMUSCULAR | 1 refills | Status: AC

## 2018-02-15 NOTE — Progress Notes (Signed)
Subjective:  Patient ID: Jamie Zhang, male    DOB: 03-24-1941  Age: 77 y.o. MRN: [id number]  CC: The primary encounter diagnosis was Age-related osteoporosis without current pathological fracture. Diagnoses of Hyperlipidemia, unspecified hyperlipidemia type, Impaired fasting glucose, Vitamin D deficiency, Long-term use of high-risk medication, Abdominal aortic aneurysm (AAA) without rupture ([place]), Atherosclerosis of aorta without gangrene ([place]), and History of vertebral compression fracture were also pertinent to this visit.  HPI Jamie Zhang presents for follow up on hyperlipidemia,  Osteoporosis with prior compression fracture, chronic low back pain , and hyperlipidemia.  Osteoporosis:  He has been taking alendronate since 2014 and repeat DEXA shows   Squamous cell  excision left hand,  [place]   Basal cell CA  removed from back   Tylenol Pm for chronic back pain  ,  Made him constipated.  Using clear lax   Going to the gym 6 days per week   Walks for 30 minutes treadmill  ,  Lifts weights.  For the past   3 years   Nocturia x 3 .    Emotionally burdened by the physical decline of his daughter who lives in [place] and suffers from complex regional pain syndrome.     Outpatient Medications Prior to Visit  Medication Sig Dispense Refill  . alendronate (FOSAMAX) 70 MG tablet TAKE 1 TABLET ONE TIME A WEEK WITH A FULL GLASS OF WATER ON AN EMPTY STOMACH. 12 tablet 5  . aspirin EC 81 MG tablet Take 81 mg by mouth daily.    [name] atorvastatin (LIPITOR) 20 MG tablet TAKE 1 TABLET EVERY DAY 90 tablet 1  . Calcium Citrate-Vitamin D (CITRACAL + D PO) Take 650 mg by mouth daily. 2 tablets daily    . Cholecalciferol (VITAMIN D3) 10000 UNITS TABS Take 1 tablet by mouth daily.    . diclofenac (VOLTAREN) 75 MG EC tablet Take 1 tablet (75 mg total) by mouth 2 (two) times daily. 60 tablet 2  . docusate sodium (STOOL SOFTENER) 100 MG capsule Take 100 mg by mouth 2 (two) times daily.    . famotidine (PEPCID) 40  MG tablet TAKE 1 TABLET EVERY DAY 90 tablet 1  . fluticasone (FLONASE) 50 MCG/ACT nasal spray USE 2 SPRAYS IN EACH NOSTRIL EVERY DAY 48 g 2   No facility-administered medications prior to visit.     Review of Systems;  Patient denies headache, fevers, malaise, unintentional weight loss, skin rash, eye pain, sinus congestion and sinus pain, sore throat, dysphagia,  hemoptysis , cough, dyspnea, wheezing, chest pain, palpitations, orthopnea, edema, abdominal pain, nausea, melena, diarrhea, constipation, flank pain, dysuria, hematuria, urinary  Frequency, nocturia, numbness, tingling, seizures,  Focal weakness, Loss of consciousness,  Tremor, insomnia, depression, anxiety, and suicidal ideation.      Objective:  BP 110/72 (BP Location: Left Arm, Patient Position: Sitting, Cuff Size: Normal)   Pulse (!) 48   Temp (!) 97.4 F (36.3 C) (Oral)   Resp 14   Ht 5\' 9"  (1.753 m)   Wt 201 lb (91.2 kg)   SpO2 97%   BMI 29.68 kg/m   BP Readings from Last 3 Encounters:  02/15/18 110/72  11/02/17 110/70  10/09/17 129/78    Wt Readings from Last 3 Encounters:  02/15/18 201 lb (91.2 kg)  11/02/17 199 lb 12.8 oz (90.6 kg)  10/09/17 200 lb (90.7 kg)    General appearance: alert, cooperative and appears stated age Ears: normal TM's and external ear canals both ears Throat: lips,  mucosa, and tongue normal; teeth and gums normal Neck: no adenopathy, no carotid bruit, supple, symmetrical, trachea midline and thyroid not enlarged, symmetric, no tenderness/mass/nodules Back: symmetric, no curvature. ROM normal. No CVA tenderness. Lungs: clear to auscultation bilaterally Heart: regular rate and rhythm, S1, S2 normal, no murmur, click, rub or gallop Abdomen: soft, non-tender; bowel sounds normal; no masses,  no organomegaly Pulses: 2+ and symmetric Skin: Skin color, texture, turgor normal. No rashes or lesions Lymph nodes: Cervical, supraclavicular, and axillary nodes normal.  Lab Results    Component Value Date   HGBA1C 5.8 02/15/2018   HGBA1C 5.7 08/31/2017   HGBA1C 5.7 02/14/2017    Lab Results  Component Value Date   CREATININE 0.93 02/15/2018   CREATININE 0.99 08/31/2017   CREATININE 0.94 02/14/2017    Lab Results  Component Value Date   WBC 6.7 02/15/2018   HGB 15.7 02/15/2018   HCT 45.2 02/15/2018   PLT 177.0 02/15/2018   GLUCOSE 96 02/15/2018   CHOL 120 02/15/2018   TRIG 74.0 02/15/2018   HDL 55.80 02/15/2018   LDLDIRECT 47.0 12/24/2015   LDLCALC 50 02/15/2018   ALT 22 02/15/2018   AST 19 02/15/2018   NA 140 02/15/2018   K 4.4 02/15/2018   CL 106 02/15/2018   CREATININE 0.93 02/15/2018   BUN 21 02/15/2018   CO2 28 02/15/2018   TSH 2.31 02/14/2017   PSA 0.86 11/03/2015   HGBA1C 5.8 02/15/2018    No results found.  Assessment & Plan:   Problem List Items Addressed This Visit    HLD (hyperlipidemia)   Relevant Orders   Lipid panel (Completed)   Impaired fasting glucose    He has lowered his a1c to nearly  normal range by losing weight and reducing the sugar in his diet. Will repeat every 6 months   Lab Results  Component Value Date   HGBA1C 5.8 02/15/2018         Relevant Orders   Comprehensive metabolic panel (Completed)   Hemoglobin A1c (Completed)   History of vertebral compression fracture    Has been taking alendronate since 2014.  Will continue until 2019  Repeat DEXA and take a one year drug holiday .  treating current pain with diclofenac and tylenol       Atherosclerosis of aorta without gangrene ([place])    Now on statin therapy.    Now followed by Vascular surgery for monitoring of  aortic aneurysm  Lab Results  Component Value Date   CHOL 120 02/15/2018   HDL 55.80 02/15/2018   LDLCALC 50 02/15/2018   LDLDIRECT 47.0 12/24/2015   TRIG 74.0 02/15/2018   CHOLHDL 2 02/15/2018         Abdominal aortic aneurysm ([facility])    Referred l to [facility] for 4 cm AA noted on CT scan initially in 2015.  Continue annual surveillance  and aggressive control of blood pressure        Other Visit Diagnoses    Age-related osteoporosis without current pathological fracture    -  Primary   Relevant Orders   DG Bone Density   Hemoglobin A1c (Completed)   DG Bone Density   Vitamin D deficiency       Relevant Orders   VITAMIN D 25 Hydroxy (Vit-D Deficiency, Fractures) (Completed)   Long-term use of high-risk medication       Relevant Orders   CBC with Differential/Platelet (Completed)      I am having Merlene Laughter start on Zoster  Vaccine Adjuvanted. I am also having him maintain his Calcium Citrate-Vitamin D (CITRACAL + D PO), Vitamin D3, aspirin EC, fluticasone, diclofenac, docusate sodium, atorvastatin, alendronate, and famotidine.  Meds ordered this encounter  Medications  . Zoster Vaccine Adjuvanted [facility]) injection    Sig: Inject 0.5 mLs into the muscle once for 1 dose.    Dispense:  1 each    Refill:  1    There are no discontinued medications.  Follow-up: Return in about 6 months (around 08/17/2018) for CPE.   Crecencio Mc, MD

## 2018-02-15 NOTE — Patient Instructions (Addendum)
Good to see you   The new Shingles  ShingRx vaccine is now available in local pharmacies and is much more protective thant Zostavaxs,  It is therefore ADVISED for all interested adults over 50 to prevent shingles   We will repeat your DEXA scan in September and at that point we will suspend the alendronate for a year

## 2018-02-16 ENCOUNTER — Other Ambulatory Visit: Payer: Self-pay | Admitting: Internal Medicine

## 2018-02-17 NOTE — Assessment & Plan Note (Signed)
Referred l to [facility] for 4 cm AA noted on CT scan initially in 2015.  Continue annual surveillance and aggressive control of blood pressure

## 2018-02-17 NOTE — Assessment & Plan Note (Signed)
Now on statin therapy.    Now followed by Vascular surgery for monitoring of  aortic aneurysm  Lab Results  Component Value Date   CHOL 120 02/15/2018   HDL 55.80 02/15/2018   LDLCALC 50 02/15/2018   LDLDIRECT 47.0 12/24/2015   TRIG 74.0 02/15/2018   CHOLHDL 2 02/15/2018

## 2018-02-17 NOTE — Assessment & Plan Note (Signed)
He has lowered his a1c to nearly  normal range by losing weight and reducing the sugar in his diet. Will repeat every 6 months   Lab Results  Component Value Date   HGBA1C 5.8 02/15/2018

## 2018-02-17 NOTE — Assessment & Plan Note (Signed)
Has been taking alendronate since 2014.  Will continue until 2019  Repeat DEXA and take a one year drug holiday .  treating current pain with diclofenac and tylenol

## 2018-04-30 ENCOUNTER — Other Ambulatory Visit: Payer: Self-pay | Admitting: Internal Medicine

## 2018-05-02 ENCOUNTER — Ambulatory Visit
Admission: RE | Admit: 2018-05-02 | Discharge: 2018-05-02 | Disposition: A | Discharge status: Home / Self Care | Payer: Medicare Other | Source: Ambulatory Visit | Attending: Internal Medicine | Admitting: Internal Medicine

## 2018-05-02 DIAGNOSIS — M810 Age-related osteoporosis without current pathological fracture: Secondary | ICD-10-CM | POA: Insufficient documentation

## 2018-05-02 DIAGNOSIS — M85851 Other specified disorders of bone density and structure, right thigh: Secondary | ICD-10-CM | POA: Diagnosis not present

## 2018-06-07 ENCOUNTER — Encounter: Payer: Self-pay | Admitting: Internal Medicine

## 2018-06-07 MED ORDER — DICLOFENAC SODIUM 75 MG PO TBEC: 75.0000 mg | DELAYED_RELEASE_TABLET | Freq: Two times a day (BID) | ORAL | 2 refills | Status: DC

## 2018-06-08 ENCOUNTER — Other Ambulatory Visit: Payer: Self-pay | Admitting: Internal Medicine

## 2018-06-18 DIAGNOSIS — L570 Actinic keratosis: Secondary | ICD-10-CM | POA: Diagnosis not present

## 2018-06-18 DIAGNOSIS — Z85828 Personal history of other malignant neoplasm of skin: Secondary | ICD-10-CM | POA: Diagnosis not present

## 2018-06-18 DIAGNOSIS — X32XXXA Exposure to sunlight, initial encounter: Secondary | ICD-10-CM | POA: Diagnosis not present

## 2018-06-18 DIAGNOSIS — D225 Melanocytic nevi of trunk: Secondary | ICD-10-CM | POA: Diagnosis not present

## 2018-06-18 DIAGNOSIS — D2261 Melanocytic nevi of right upper limb, including shoulder: Secondary | ICD-10-CM | POA: Diagnosis not present

## 2018-06-18 DIAGNOSIS — D2272 Melanocytic nevi of left lower limb, including hip: Secondary | ICD-10-CM | POA: Diagnosis not present

## 2018-06-18 DIAGNOSIS — D2262 Melanocytic nevi of left upper limb, including shoulder: Secondary | ICD-10-CM | POA: Diagnosis not present

## 2018-06-18 DIAGNOSIS — Z08 Encounter for follow-up examination after completed treatment for malignant neoplasm: Secondary | ICD-10-CM | POA: Diagnosis not present

## 2018-06-18 DIAGNOSIS — D2271 Melanocytic nevi of right lower limb, including hip: Secondary | ICD-10-CM | POA: Diagnosis not present

## 2018-07-01 DIAGNOSIS — Z961 Presence of intraocular lens: Secondary | ICD-10-CM | POA: Diagnosis not present

## 2018-07-10 DIAGNOSIS — R42 Dizziness and giddiness: Secondary | ICD-10-CM | POA: Diagnosis not present

## 2018-07-10 DIAGNOSIS — R001 Bradycardia, unspecified: Secondary | ICD-10-CM | POA: Diagnosis not present

## 2018-07-10 LAB — HEPATIC FUNCTION PANEL
ALT: 16 (ref 10–40)
AST: 16 (ref 14–40)
Alkaline Phosphatase: 40 (ref 25–125)
Bilirubin, Total: 1.2

## 2018-07-10 LAB — CBC AND DIFFERENTIAL
HEMATOCRIT: 47 (ref 41–53)
Hemoglobin: 15.9 (ref 13.5–17.5)
PLATELETS: 177 (ref 150–399)
WBC: 8.9

## 2018-07-10 LAB — BASIC METABOLIC PANEL
BUN: 17 (ref 4–21)
Creatinine: 0.9 (ref 0.6–1.3)
GLUCOSE: 98
Potassium: 3.9 (ref 3.4–5.3)
Sodium: 140 (ref 137–147)

## 2018-07-10 LAB — TSH: TSH: 3.21 (ref <=5.90)

## 2018-07-16 DIAGNOSIS — R011 Cardiac murmur, unspecified: Secondary | ICD-10-CM | POA: Diagnosis not present

## 2018-07-16 DIAGNOSIS — N401 Enlarged prostate with lower urinary tract symptoms: Secondary | ICD-10-CM | POA: Diagnosis not present

## 2018-07-16 DIAGNOSIS — I714 Abdominal aortic aneurysm, without rupture: Secondary | ICD-10-CM | POA: Diagnosis not present

## 2018-07-16 DIAGNOSIS — K219 Gastro-esophageal reflux disease without esophagitis: Secondary | ICD-10-CM | POA: Diagnosis not present

## 2018-07-16 DIAGNOSIS — R351 Nocturia: Secondary | ICD-10-CM | POA: Diagnosis not present

## 2018-07-16 DIAGNOSIS — R001 Bradycardia, unspecified: Secondary | ICD-10-CM | POA: Diagnosis not present

## 2018-07-16 DIAGNOSIS — E7849 Other hyperlipidemia: Secondary | ICD-10-CM | POA: Diagnosis not present

## 2018-07-16 DIAGNOSIS — R42 Dizziness and giddiness: Secondary | ICD-10-CM | POA: Diagnosis not present

## 2018-07-29 DIAGNOSIS — R011 Cardiac murmur, unspecified: Secondary | ICD-10-CM | POA: Diagnosis not present

## 2018-08-15 DIAGNOSIS — R001 Bradycardia, unspecified: Secondary | ICD-10-CM | POA: Diagnosis not present

## 2018-08-19 ENCOUNTER — Ambulatory Visit (INDEPENDENT_AMBULATORY_CARE_PROVIDER_SITE_OTHER): Payer: Medicare Other | Admitting: Internal Medicine

## 2018-08-19 ENCOUNTER — Encounter: Payer: Self-pay | Admitting: Internal Medicine

## 2018-08-19 VITALS — BP 114/70 | HR 51 | Temp 98.5°F | Resp 15 | Ht 69.0 in | Wt 203.4 lb

## 2018-08-19 DIAGNOSIS — G8929 Other chronic pain: Secondary | ICD-10-CM

## 2018-08-19 DIAGNOSIS — M545 Low back pain, unspecified: Secondary | ICD-10-CM

## 2018-08-19 DIAGNOSIS — R001 Bradycardia, unspecified: Secondary | ICD-10-CM

## 2018-08-19 DIAGNOSIS — I714 Abdominal aortic aneurysm, without rupture, unspecified: Secondary | ICD-10-CM

## 2018-08-19 DIAGNOSIS — R7301 Impaired fasting glucose: Secondary | ICD-10-CM | POA: Diagnosis not present

## 2018-08-19 DIAGNOSIS — Z8781 Personal history of (healed) traumatic fracture: Secondary | ICD-10-CM | POA: Diagnosis not present

## 2018-08-19 DIAGNOSIS — R7303 Prediabetes: Secondary | ICD-10-CM

## 2018-08-19 DIAGNOSIS — R0683 Snoring: Secondary | ICD-10-CM

## 2018-08-19 DIAGNOSIS — G4710 Hypersomnia, unspecified: Secondary | ICD-10-CM | POA: Diagnosis not present

## 2018-08-19 DIAGNOSIS — Z23 Encounter for immunization: Secondary | ICD-10-CM

## 2018-08-19 LAB — POCT GLYCOSYLATED HEMOGLOBIN (HGB A1C): Hemoglobin A1C: 5.4 % (ref 4.0–5.6)

## 2018-08-19 MED ORDER — MECLIZINE HCL 12.5 MG PO TABS: 12.5000 mg | ORAL_TABLET | Freq: Three times a day (TID) | ORAL | 0 refills | Status: DC | PRN

## 2018-08-19 NOTE — Assessment & Plan Note (Addendum)
He has lowered his a1c  From 5.8 to .4   by leducing the sugar in his diet. Weight loss encouraged.  Will repeat every 6 months   Lab Results  Component Value Date   HGBA1C 5.4 08/19/2018

## 2018-08-19 NOTE — Patient Instructions (Addendum)
You can stop the daily meclizine and use as needed for vertigo  We will contact Dr Etta Quill office for the results of the Holter monitor   I will set you up for a home sleep study    I recommend taking a magnesium supplement  400 mg daily in the evening    Health Maintenance, Male A healthy lifestyle and preventive care is important for your health and wellness. Ask your health care provider about what schedule of regular examinations is right for you. What should I know about weight and diet? Eat a Healthy Diet  Eat plenty of vegetables, fruits, whole grains, low-fat dairy products, and lean protein.  Do not eat a lot of foods high in solid fats, added sugars, or salt.  Maintain a Healthy Weight Regular exercise can help you achieve or maintain a healthy weight. You should:  Do at least 150 minutes of exercise each week. The exercise should increase your heart rate and make you sweat (moderate-intensity exercise).  Do strength-training exercises at least twice a week.  Watch Your Levels of Cholesterol and Blood Lipids  Have your blood tested for lipids and cholesterol every 5 years starting at 78 years of age. If you are at high risk for heart disease, you should start having your blood tested when you are 78 years old. You may need to have your cholesterol levels checked more often if: ? Your lipid or cholesterol levels are high. ? You are older than 78 years of age. ? You are at high risk for heart disease.  What should I know about cancer screening? Many types of cancers can be detected early and may often be prevented. Lung Cancer  You should be screened every year for lung cancer if: ? You are a current smoker who has smoked for at least 30 years. ? You are a former smoker who has quit within the past 15 years.  Talk to your health care provider about your screening options, when you should start screening, and how often you should be screened.  Colorectal  Cancer  Routine colorectal cancer screening usually begins at 78 years of age and should be repeated every 5-10 years until you are 78 years old. You may need to be screened more often if early forms of precancerous polyps or small growths are found. Your health care provider may recommend screening at an earlier age if you have risk factors for colon cancer.  Your health care provider may recommend using home test kits to check for hidden blood in the stool.  A small camera at the end of a tube can be used to examine your colon (sigmoidoscopy or colonoscopy). This checks for the earliest forms of colorectal cancer.  Prostate and Testicular Cancer  Depending on your age and overall health, your health care provider may do certain tests to screen for prostate and testicular cancer.  Talk to your health care provider about any symptoms or concerns you have about testicular or prostate cancer.  Skin Cancer  Check your skin from head to toe regularly.  Tell your health care provider about any new moles or changes in moles, especially if: ? There is a change in a mole's size, shape, or color. ? You have a mole that is larger than a pencil eraser.  Always use sunscreen. Apply sunscreen liberally and repeat throughout the day.  Protect yourself by wearing long sleeves, pants, a wide-brimmed hat, and sunglasses when outside.  What should I know about heart  disease, diabetes, and high blood pressure?  If you are 5-35 years of age, have your blood pressure checked every 3-5 years. If you are 5 years of age or older, have your blood pressure checked every year. You should have your blood pressure measured twice-once when you are at a hospital or clinic, and once when you are not at a hospital or clinic. Record the average of the two measurements. To check your blood pressure when you are not at a hospital or clinic, you can use: ? An automated blood pressure machine at a pharmacy. ? A home blood  pressure monitor.  Talk to your health care provider about your target blood pressure.  If you are between 30-33 years old, ask your health care provider if you should take aspirin to prevent heart disease.  Have regular diabetes screenings by checking your fasting blood sugar level. ? If you are at a normal weight and have a low risk for diabetes, have this test once every three years after the age of 48. ? If you are overweight and have a high risk for diabetes, consider being tested at a younger age or more often.  A one-time screening for abdominal aortic aneurysm (AAA) by ultrasound is recommended for men aged 9-75 years who are current or former smokers. What should I know about preventing infection? Hepatitis B If you have a higher risk for hepatitis B, you should be screened for this virus. Talk with your health care provider to find out if you are at risk for hepatitis B infection. Hepatitis C Blood testing is recommended for:  Everyone born from [birth year] through 1966.  Anyone with known risk factors for hepatitis C.  Sexually Transmitted Diseases (STDs)  You should be screened each year for STDs including gonorrhea and chlamydia if: ? You are sexually active and are younger than 78 years of age. ? You are older than 78 years of age and your health care provider tells you that you are at risk for this type of infection. ? Your sexual activity has changed since you were last screened and you are at an increased risk for chlamydia or gonorrhea. Ask your health care provider if you are at risk.  Talk with your health care provider about whether you are at high risk of being infected with HIV. Your health care provider may recommend a prescription medicine to help prevent HIV infection.  What else can I do?  Schedule regular health, dental, and eye exams.  Stay current with your vaccines (immunizations).  Do not use any tobacco products, such as cigarettes, chewing tobacco, and  e-cigarettes. If you need help quitting, ask your health care provider.  Limit alcohol intake to no more than 2 drinks per day. One drink equals 12 ounces of beer, 5 ounces of wine, or 1 ounces of hard liquor.  Do not use street drugs.  Do not share needles.  Ask your health care provider for help if you need support or information about quitting drugs.  Tell your health care provider if you often feel depressed.  Tell your health care provider if you have ever been abused or do not feel safe at home. This information is not intended to replace advice given to you by your health care provider. Make sure you discuss any questions you have with your health care provider. Document Released: 04/27/2008 Document Revised: 06/28/2016 Document Reviewed: 08/03/2015 Elsevier Interactive Patient Education  [employer].

## 2018-08-19 NOTE — Assessment & Plan Note (Addendum)
Plain films were ordered last year but never done. Continue diclofenac and tylenol prn ,  Weight loss encouraged. . Recent episode brought on by prolonged driving

## 2018-08-19 NOTE — Assessment & Plan Note (Addendum)
Referred to [facility] for 4 cm AA noted on CT scan seen  initially in 2015. Dur.ing Nov 2018 evaluation ,His aortic duplex showed slight growth of his abdominal aortic aneurysm now measuring 3.8 cm in maximal diameter.  This previously measured 3.5 cm in maximal diameter 1 year ago. His BP is well controlled and he has abstained  from tobacco.  He has his annual  follow up Nov 29

## 2018-08-19 NOTE — Assessment & Plan Note (Signed)
He has been taking alendronate since 2014 for mild osteopenia.  Last DEXA 2019

## 2018-08-19 NOTE — Progress Notes (Signed)
Patient ID: JOZEF EISENBEIS, male    DOB: February 03, 1941  Age: 77 y.o. MRN: [id number]  The patient is here for follow up and management of other chronic and acute problems.   The risk factors are reflected in the social history.  The roster of all physicians providing medical care to patient - is listed in the Snapshot section of the chart.  Activities of daily living:  The patient is 100% independent in all ADLs: dressing, toileting, feeding as well as independent mobility  Home safety : The patient has smoke detectors in the home. They wear seatbelts.  There are no firearms at home. There is no violence in the home.   There is no risks for hepatitis, STDs or HIV. There is no   history of blood transfusion. They have no travel history to infectious disease endemic areas of the world.  The patient has seen their dentist in the last six month. They have seen their eye doctor in the last year. They admit to slight hearing difficulty with regard to whispered voices and some television programs.  They have deferred audiologic testing in the last year.  They do not  have excessive sun exposure. Discussed the need for sun protection: hats, long sleeves and use of sunscreen if there is significant sun exposure.   Diet: the importance of a healthy diet is discussed. They do have a healthy diet.  The benefits of regular aerobic exercise were discussed. he walks 4 times per week ,  20 minutes.   Depression screen: there are no signs or vegative symptoms of depression- irritability, change in appetite, anhedonia, sadness/tearfullness.  Cognitive assessment: the patient manages all their financial and personal affairs and is actively engaged. They could relate day,date,year and events; recalled 2/3 objects at 3 minutes; performed clock-face test normally.  The following portions of the patient's history were reviewed and updated as appropriate: allergies, current medications, past family history, past medical  history,  past surgical history, past social history  and problem list.  Visual acuity was not assessed per patient preference since she has regular follow up with her ophthalmologist. Hearing and body mass index were assessed and reviewed.   During the course of the visit the patient was educated and counseled about appropriate screening and preventive services including : fall prevention , diabetes screening, nutrition counseling, colorectal cancer screening, and recommended immunizations.    CC: The primary encounter diagnosis was Snoring. Diagnoses of Hypersomnolence, Impaired fasting glucose, Encounter for immunization, Chronic midline low back pain without sciatica, History of vertebral compression fracture, Prediabetes, Abdominal aortic aneurysm (AAA) without rupture ([place]), and Bradycardia, sinus were also pertinent to this visit.   Recurrent Vertigo and bradycardia, with  One episode lasting over 2 weeks. Seen in [facility], referred to cardiology.   Had Holter Monitor ECHO and stress test  By [facility]  Sept 16,  Has not received  the results of holter monitor   sciatica on right side. Pain occurs after driving for over 3 hours.  Relieved with walking around, returns after an hour or two of driving again .  Does not keep wallet in back pocket.  Does not bother him at other times.   Prediabetes:  Noted at last visit in April with ac of 5.8.      History Guss has a past medical history of Arthritis, Cataract (01/02/2011), Colon polyps (2000), GERD (gastroesophageal reflux disease), Hemorrhoids (2006), HLD (hyperlipidemia), Iron deficiency anemia (03/20/2006), Migraine, Osteopenia, RLS (restless legs syndrome) (03/20/2006),  Schatzki's ring, and Skin cancer (01/04/2012).   He has a past surgical history that includes Cataract Surgery (Bilateral); Rotator cuff repair (Left); Breast biopsy (Left); Inguinal hernia repair (Left); and Colonoscopy with propofol (N/A, 04/18/2017).   His family  history includes Alcohol abuse in his father, mother, and sister; Arthritis in his mother; Cancer (age of onset: 60) in his sister; Cancer (age of onset: 20) in his father; Hypertension in his mother.He reports that he has quit smoking. His smoking use included cigarettes. He has a 42.00 pack-year smoking history. He has never used smokeless tobacco. He reports that he drinks alcohol. He reports that he does not use drugs.  Outpatient Medications Prior to Visit  Medication Sig Dispense Refill  . alendronate (FOSAMAX) 70 MG tablet TAKE 1 TABLET ONE TIME A WEEK WITH A FULL GLASS OF WATER ON AN EMPTY STOMACH. 12 tablet 5  . aspirin EC 81 MG tablet Take 81 mg by mouth daily.    [name] atorvastatin (LIPITOR) 20 MG tablet TAKE 1 TABLET EVERY DAY 90 tablet 1  . Calcium Citrate-Vitamin D (CITRACAL + D PO) Take 650 mg by mouth daily. 2 tablets daily    . Cholecalciferol (VITAMIN D3) 10000 UNITS TABS Take 1 tablet by mouth daily.    . diclofenac (VOLTAREN) 75 MG EC tablet Take 1 tablet (75 mg total) by mouth 2 (two) times daily. 60 tablet 2  . docusate sodium (STOOL SOFTENER) 100 MG capsule Take 100 mg by mouth 2 (two) times daily.    . famotidine (PEPCID) 40 MG tablet TAKE 1 TABLET EVERY DAY 90 tablet 1  . fluticasone (FLONASE) 50 MCG/ACT nasal spray USE 2 SPRAYS IN EACH NOSTRIL EVERY DAY 48 g 2  . meclizine (ANTIVERT) 12.5 MG tablet      No facility-administered medications prior to visit.     Review of Systems   Patient denies headache, fevers, malaise, unintentional weight loss, skin rash, eye pain, sinus congestion and sinus pain, sore throat, dysphagia,  hemoptysis , cough, dyspnea, wheezing, chest pain, palpitations, orthopnea, edema, abdominal pain, nausea, melena, diarrhea, constipation, flank pain, dysuria, hematuria, urinary  Frequency, nocturia, numbness, tingling, seizures,  Focal weakness, Loss of consciousness,  Tremor, insomnia, depression, anxiety, and suicidal ideation.      Objective:   BP 114/70 (BP Location: Left Arm, Patient Position: Sitting, Cuff Size: Normal)   Pulse (!) 51   Temp 98.5 F (36.9 C) (Oral)   Resp 15   Ht 5\' 9"  (1.753 m)   Wt 203 lb 6.4 oz (92.3 kg)   SpO2 96%   BMI 30.04 kg/m   Physical Exam   General appearance: alert, cooperative and appears stated age Ears: normal TM's and external ear canals both ears Throat: lips, mucosa, and tongue normal; teeth and gums normal Neck: no adenopathy, no carotid bruit, supple, symmetrical, trachea midline and thyroid not enlarged, symmetric, no tenderness/mass/nodules Back: symmetric, no curvature. ROM normal. No CVA tenderness. Lungs: clear to auscultation bilaterally Heart: regular rate and rhythm, S1, S2 normal, no murmur, click, rub or gallop Abdomen: soft, non-tender; bowel sounds normal; no masses,  no organomegaly Pulses: 2+ and symmetric Skin: Skin color, texture, turgor normal. No rashes or lesions Lymph nodes: Cervical, supraclavicular, and axillary nodes normal. Neuro: CNs 2-12 intact. DTRs 2+/4 in biceps, brachioradialis, patellars and achilles. Muscle strength 5/5 in upper and lower exremities. Fine resting tremor bilaterally both hands cerebellar function normal. Romberg negative.  No pronator drift.   Gait normal.     Assessment &  Plan:   Problem List Items Addressed This Visit    Abdominal aortic aneurysm ([place])    Referred to [facility] for 4 cm AA noted on CT scan seen  initially in 2015. Dur.ing Nov 2018 evaluation ,His aortic duplex showed slight growth of his abdominal aortic aneurysm now measuring 3.8 cm in maximal diameter.  This previously measured 3.5 cm in maximal diameter 1 year ago. His BP is well controlled and he has abstained  from tobacco.  He has his annual  follow up Nov 29      Bradycardia, sinus    Accompanied by several episodes of vertigo with position change.  cardiac workup done in Sept by [facility].  Holter Monitor results not available.  ECHO  Normal,  EF 55%  .  Stress test no ischemic changes. Requesting result of Holter monitor.  Given his history of snoring and hypersomnolence,  Home sleep study ordered      Chronic midline low back pain without sciatica    Plain films were ordered last year but never done. Continue diclofenac and tylenol prn ,  Weight loss encouraged. . Recent episode brought on by prolonged driving       History of vertebral compression fracture    He has been taking alendronate since 2014 for mild osteopenia.  Last DEXA 2019        Prediabetes    He has lowered his a1c  From 5.8 to .4   by leducing the sugar in his diet. Weight loss encouraged.  Will repeat every 6 months   Lab Results  Component Value Date   HGBA1C 5.4 08/19/2018          Other Visit Diagnoses    Snoring    -  Primary   Relevant Orders   Home sleep test   Hypersomnolence       Relevant Orders   Home sleep test   Encounter for immunization       Relevant Orders   Flu vaccine HIGH DOSE PF (Completed)    A total of 40 minutes was spent with patient more than half of which was spent in counseling patient on the above mentioned issues , reviewing and explaining recent labs and imaging studies done, and coordination of care.  I have changed Vonda [place]igua. Nabor's meclizine. I am also having him maintain his Calcium Citrate-Vitamin D (CITRACAL + D PO), Vitamin D3, aspirin EC, docusate sodium, alendronate, fluticasone, famotidine, diclofenac, and atorvastatin.  Meds ordered this encounter  Medications  . meclizine (ANTIVERT) 12.5 MG tablet    Sig: Take 1 tablet (12.5 mg total) by mouth 3 (three) times daily as needed for dizziness.    Dispense:  90 tablet    Refill:  0    Medications Discontinued During This Encounter  Medication Reason  . meclizine (ANTIVERT) 12.5 MG tablet Reorder    Follow-up: Return in about 6 months (around 02/18/2019).   Crecencio Mc, MD

## 2018-08-20 DIAGNOSIS — R001 Bradycardia, unspecified: Secondary | ICD-10-CM | POA: Insufficient documentation

## 2018-08-20 NOTE — Assessment & Plan Note (Addendum)
Accompanied by several episodes of vertigo with position change.  cardiac workup done in Sept by [facility].  Holter Monitor results not available.  ECHO  Normal,  EF 55%  . Stress test no ischemic changes. Requesting result of Holter monitor.  Given his history of snoring and hypersomnolence,  Home sleep study ordered

## 2018-08-29 DIAGNOSIS — R0602 Shortness of breath: Secondary | ICD-10-CM | POA: Diagnosis not present

## 2018-08-29 DIAGNOSIS — G4733 Obstructive sleep apnea (adult) (pediatric): Secondary | ICD-10-CM | POA: Diagnosis not present

## 2018-08-29 LAB — PULMONARY FUNCTION TEST

## 2018-08-30 DIAGNOSIS — G4733 Obstructive sleep apnea (adult) (pediatric): Secondary | ICD-10-CM | POA: Diagnosis not present

## 2018-08-30 DIAGNOSIS — R0602 Shortness of breath: Secondary | ICD-10-CM | POA: Diagnosis not present

## 2018-09-10 ENCOUNTER — Telehealth: Payer: Self-pay | Admitting: Internal Medicine

## 2018-09-10 DIAGNOSIS — G4733 Obstructive sleep apnea (adult) (pediatric): Secondary | ICD-10-CM | POA: Insufficient documentation

## 2018-09-10 NOTE — Telephone Encounter (Signed)
Left voicemail for patient to call back. 

## 2018-09-10 NOTE — Telephone Encounter (Signed)
OSA suggested by home sleep study  CPAP titration study needed and ordered. Jamie Zhang needs the home sleep report, will be   In your orange folder

## 2018-09-12 ENCOUNTER — Other Ambulatory Visit: Payer: Self-pay | Admitting: Internal Medicine

## 2018-09-12 NOTE — Telephone Encounter (Signed)
Patient advised of below and verbalized

## 2018-09-12 NOTE — Telephone Encounter (Signed)
Last OV 08/19/2018   Last refilled

## 2018-09-16 ENCOUNTER — Other Ambulatory Visit: Payer: Self-pay | Admitting: Internal Medicine

## 2018-09-18 ENCOUNTER — Telehealth: Payer: Self-pay | Admitting: *Deleted

## 2018-09-18 NOTE — Telephone Encounter (Signed)
Copied from [place] [id number]. Topic: General - Other >> Sep 18, 2018  1:23 PM Yvette Rack wrote: Reason for CRM: Delanna Ahmadi [phone] from [facility] calling to speak with someone about an order for a CPAP machine that was faxed over to them today

## 2018-09-19 ENCOUNTER — Ambulatory Visit: Payer: Medicare Other | Attending: Neurology

## 2018-09-19 DIAGNOSIS — R0683 Snoring: Secondary | ICD-10-CM | POA: Diagnosis present

## 2018-09-19 DIAGNOSIS — G4733 Obstructive sleep apnea (adult) (pediatric): Secondary | ICD-10-CM | POA: Diagnosis not present

## 2018-09-23 NOTE — Telephone Encounter (Signed)
Crystal from [employer] [facility].  States they tried to set pt up with a CPAP machine, but he told them he was having a second sleep study done over the weekend. Crystal is needing clarification on this before scheduling him.

## 2018-09-23 NOTE — Telephone Encounter (Signed)
Please advise 

## 2018-09-24 NOTE — Telephone Encounter (Signed)
Spoke with Crystal from [facility] and she stated that she called the pt last week to set up an appt to go out and have his CPAP machine set up. Pt stated to her that he was having another sleep study at the end of last week. Looked in the pt's chart and saw that he did have a tiration study done on the 09/19/2018. Faxed it to [place].

## 2018-09-25 ENCOUNTER — Other Ambulatory Visit: Payer: Self-pay | Admitting: Internal Medicine

## 2018-09-26 ENCOUNTER — Other Ambulatory Visit: Payer: Self-pay | Admitting: Internal Medicine

## 2018-09-26 DIAGNOSIS — G4733 Obstructive sleep apnea (adult) (pediatric): Secondary | ICD-10-CM

## 2018-09-27 ENCOUNTER — Telehealth: Payer: Self-pay

## 2018-09-27 NOTE — Telephone Encounter (Signed)
Order has been refaxed with NPI number.

## 2018-09-27 NOTE — Telephone Encounter (Signed)
Copied from [place] [id number]. Topic: General - Other >> Sep 27, 2018 12:32 PM Oneta Rack wrote: Osvaldo Human name: Barbaraann Rondo  Relation to pt: referral specialist [facility]  Call back number: [phone] ext [phone] fax #    Reason for call:  Requesting c pap order re fax with Dr. Derrel Nip NPI # fax # [phone].

## 2018-09-30 ENCOUNTER — Other Ambulatory Visit: Payer: Self-pay

## 2018-10-11 ENCOUNTER — Ambulatory Visit (INDEPENDENT_AMBULATORY_CARE_PROVIDER_SITE_OTHER): Payer: Medicare Other | Admitting: Vascular Surgery

## 2018-10-11 ENCOUNTER — Other Ambulatory Visit (INDEPENDENT_AMBULATORY_CARE_PROVIDER_SITE_OTHER): Payer: Medicare Other

## 2018-10-11 DIAGNOSIS — I714 Abdominal aortic aneurysm, without rupture: Secondary | ICD-10-CM

## 2018-10-15 ENCOUNTER — Ambulatory Visit (INDEPENDENT_AMBULATORY_CARE_PROVIDER_SITE_OTHER): Payer: Medicare Other

## 2018-10-15 ENCOUNTER — Encounter (INDEPENDENT_AMBULATORY_CARE_PROVIDER_SITE_OTHER): Payer: Self-pay | Admitting: Vascular Surgery

## 2018-10-15 ENCOUNTER — Ambulatory Visit (INDEPENDENT_AMBULATORY_CARE_PROVIDER_SITE_OTHER): Payer: Medicare Other | Admitting: Vascular Surgery

## 2018-10-15 VITALS — BP 126/80 | HR 50 | Resp 16 | Ht 68.75 in | Wt 204.8 lb

## 2018-10-15 DIAGNOSIS — E7800 Pure hypercholesterolemia, unspecified: Secondary | ICD-10-CM

## 2018-10-15 DIAGNOSIS — I714 Abdominal aortic aneurysm, without rupture, unspecified: Secondary | ICD-10-CM

## 2018-10-15 NOTE — Progress Notes (Signed)
MRN : [id number]  Jamie Zhang is a 77 y.o. ([birth year]) male who presents with chief complaint of  Chief Complaint  Patient presents with  . Follow-up    1 year AAA u/s follow up  .  History of Present Illness: Patient returns today in follow up of his abdominal aortic aneurysm.  No major changes or problems since his last visit.  He has no complaints today.  He denies any aneurysm related symptoms. Specifically, the patient denies new back or abdominal pain, or signs of peripheral embolization. There has been slight growth of his abdominal aortic aneurysm now measuring 4.1 cm x 3.8 cm in maximal diameter.  This is a couple millimeters larger than it was last year.   Current Outpatient Medications  Medication Sig Dispense Refill  . alendronate (FOSAMAX) 70 MG tablet TAKE 1 TABLET ONE TIME A WEEK WITH A FULL GLASS OF WATER ON AN EMPTY STOMACH. 12 tablet 5  . aspirin EC 81 MG tablet Take 81 mg by mouth daily.    [name] atorvastatin (LIPITOR) 20 MG tablet TAKE 1 TABLET EVERY DAY 90 tablet 1  . Calcium Citrate-Vitamin D (CITRACAL + D PO) Take 650 mg by mouth daily. 2 tablets daily    . Cholecalciferol (VITAMIN D3) 10000 UNITS TABS Take 1 tablet by mouth daily.    . diclofenac (VOLTAREN) 75 MG EC tablet Take 1 tablet (75 mg total) by mouth 2 (two) times daily. 60 tablet 2  . docusate sodium (STOOL SOFTENER) 100 MG capsule Take 100 mg by mouth 2 (two) times daily.    . famotidine (PEPCID) 40 MG tablet TAKE 1 TABLET EVERY DAY 90 tablet 1  . fluticasone (FLONASE) 50 MCG/ACT nasal spray USE 2 SPRAYS IN EACH NOSTRIL EVERY DAY 48 g 2  . meclizine (ANTIVERT) 12.5 MG tablet TAKE 1 TABLET THREE TIMES DAILY AS NEEDED FOR DIZZINESS 90 tablet 0   No current facility-administered medications for this visit.     Past Medical History:  Diagnosis Date  . Arthritis   . Cataract 01/02/2011   Bilateral - s/p excision  . Colon polyps 2000   Hyperplastic - Repeat colonoscopy 2015  . GERD (gastroesophageal  reflux disease)    Pantoprazole  . Hemorrhoids 2006  . HLD (hyperlipidemia)   . Iron deficiency anemia 03/20/2006   Donating too much blood  . Migraine    No longer has problems  . Osteopenia    on Fosamax  . RLS (restless legs syndrome) 03/20/2006   2/2 iron deficiency  . Schatzki's ring   . Skin cancer 01/04/2012   Basal cell nose, squamous cell scalp - Dr. Ula Lingo    Past Surgical History:  Procedure Laterality Date  . BREAST BIOPSY Left    benign  . Cataract Surgery Bilateral   . COLONOSCOPY WITH PROPOFOL N/A 04/18/2017   Procedure: COLONOSCOPY WITH PROPOFOL;  Surgeon: Robert Bellow, MD;  Location: [facility] ENDOSCOPY;  Service: Endoscopy;  Laterality: N/A;  . INGUINAL HERNIA REPAIR Left   . ROTATOR CUFF REPAIR Left     Social History  Substance Use Topics  . Smoking status: Former Smoker    Packs/day: 2.00    Years: 21.00    Types: Cigarettes  . Smokeless tobacco: Not on file  . Alcohol use No     Comment: Occasional wine glass     Family History       Family History  Problem Relation Age of Onset  . Alcohol abuse Mother   .  Cancer Father 35    Throat Cancer - died  . Alcohol abuse Father   . Cancer Sister 17    Breast Cancer     No Known Allergies   REVIEW OF SYSTEMS(Negative unless checked)  Constitutional: [] Weight loss[] Fever[] Chills Cardiac:[] Chest pain[] Chest pressure[] Palpitations [] Shortness of breath when laying flat [] Shortness of breath at rest [] Shortness of breath with exertion. Vascular: [] Pain in legs with walking[] Pain in legsat rest[] Pain in legs when laying flat [] Claudication [] Pain in feet when walking [] Pain in feet at rest [] Pain in feet when laying flat [] History of DVT [] Phlebitis [] Swelling in legs [] Varicose veins [] Non-healing ulcers Pulmonary: [] Uses home oxygen [] Productive cough[] Hemoptysis [] Wheeze [] COPD [] Asthma Neurologic: [] Dizziness  [] Blackouts [] Seizures [] History of stroke [] History of TIA[] Aphasia [] Temporary blindness[] Dysphagia [] Weaknessor numbness in arms [] Weakness or numbnessin legs Musculoskeletal: [x] Arthritis [] Joint swelling [] Joint pain [x] Low back pain Hematologic:[] Easy bruising[] Easy bleeding [] Hypercoagulable state [] Anemic  Gastrointestinal:[] Blood in stool[] Vomiting blood[x] Gastroesophageal reflux/heartburn[] Abdominal pain Genitourinary: [] Chronic kidney disease [] Difficulturination [] Frequenturination [] Burning with urination[] Hematuria Skin: [] Rashes [] Ulcers [] Wounds Psychological: [] History of anxiety[] History of major depression.    Physical Examination  BP 126/80 (BP Location: Right Arm)   Pulse (!) 50   Resp 16   Ht 5' 8.75" (1.746 m)   Wt 204 lb 12.8 oz (92.9 kg)   BMI 30.46 kg/m  Gen:  WD/WN, NAD.  Appears younger than stated age Head: [place]/AT, No temporalis wasting. Ear/Nose/Throat: Hearing grossly intact, nares w/o erythema or drainage Eyes: Conjunctiva clear. Sclera non-icteric Neck: Supple.  Trachea midline Pulmonary:  Good air movement, no use of accessory muscles.  Cardiac: RRR, no JVD Vascular:  Vessel Right Left  Radial Palpable Palpable                          PT Palpable Palpable  DP Palpable Palpable   Gastrointestinal: soft, non-tender/non-distended.  Mildly increased aortic impulse Musculoskeletal: M/S 5/5 throughout.  No deformity or atrophy.  No edema. Neurologic: Sensation grossly intact in extremities.  Symmetrical.  Speech is fluent.  Psychiatric: Judgment intact, Mood & affect appropriate for pt's clinical situation. Dermatologic: No rashes or ulcers noted.  No cellulitis or open wounds.       Labs Recent Results (from the past 2160 hour(s))  POCT HgB A1C     Status: None   Collection Time: 08/19/18 10:04 AM  Result Value Ref Range   Hemoglobin A1C 5.4 4.0 - 5.6 %   HbA1c POC  (<> result, manual entry)     HbA1c, POC (prediabetic range)     HbA1c, POC (controlled diabetic range)      Radiology No results found.  Assessment/Plan HLD (hyperlipidemia) lipid control important in reducing the progression of atherosclerotic disease. Continue statin therapy  Abdominal aortic aneurysm There has been slight growth of his abdominal aortic aneurysm now measuring 4.1 cm x 3.8 cm in maximal diameter.  This is a couple millimeters larger than it was last year.  His blood pressure is generally good and he no longer smokes.  We will shorten his follow-up interval to 6 months as this has continued to grow and is now greater than 4 cm in maximal diameter.  He will contact our office with any problems in the interim.    Leotis Pain, MD  10/15/2018 9:22 AM    This note was created with Dragon medical transcription system.  Any errors from dictation are purely unintentional

## 2018-10-15 NOTE — Telephone Encounter (Signed)
Spoke with pt and scheduled him a CPAP follow up with Dr. Derrel Nip. Pt is aware of appt date and time.

## 2018-10-15 NOTE — Assessment & Plan Note (Signed)
There has been slight growth of his abdominal aortic aneurysm now measuring 4.1 cm x 3.8 cm in maximal diameter.  This is a couple millimeters larger than it was last year.  His blood pressure is generally good and he no longer smokes.  We will shorten his follow-up interval to 6 months as this has continued to grow and is now greater than 4 cm in maximal diameter.  He will contact our office with any problems in the interim.

## 2018-10-15 NOTE — Patient Instructions (Signed)
Abdominal Aortic Aneurysm Blood pumps away from the heart through tubes (blood vessels) called arteries. Aneurysms are weak or damaged places in the wall of an artery. It bulges out like a balloon. An abdominal aortic aneurysm happens in the main artery of the body (aorta). It can burst or tear, causing bleeding inside the body. This is an emergency. It needs treatment right away. What are the causes? The exact cause is unknown. Things that could cause this problem include:  Fat and other substances building up in the lining of a tube.  Swelling of the walls of a blood vessel.  Certain tissue diseases.  Belly (abdominal) trauma.  An infection in the main artery of the body.  What increases the risk? There are things that make it more likely for you to have an aneurysm. These include:  Being over the age of 78 years old.  Having high blood pressure (hypertension).  Being a male.  Being white.  Being very overweight (obese).  Having a family history of aneurysm.  Using tobacco products.  What are the signs or symptoms? Symptoms depend on the size of the aneurysm and how fast it grows. There may not be symptoms. If symptoms occur, they can include:  Pain (belly, side, lower back, or groin).  Feeling full after eating a small amount of food.  Feeling sick to your stomach (nauseous), throwing up (vomiting), or both.  Feeling a lump in your belly that feels like it is beating (pulsating).  Feeling like you will pass out (faint).  How is this treated?  Medicine to control blood pressure and pain.  Imaging tests to see if the aneurysm gets bigger.  Surgery. How is this prevented? To lessen your chance of getting this condition:  Stop smoking. Stop chewing tobacco.  Limit or avoid alcohol.  Keep your blood pressure, blood sugar, and cholesterol within normal limits.  Eat less salt.  Eat foods low in saturated fats and cholesterol. These are found in animal and  whole dairy products.  Eat more fiber. Fiber is found in whole grains, vegetables, and fruits.  Keep a healthy weight.  Stay active and exercise often.  This information is not intended to replace advice given to you by your health care provider. Make sure you discuss any questions you have with your health care provider. Document Released: 02/24/2013 Document Revised: 04/06/2016 Document Reviewed: 11/29/2012 Elsevier Interactive Patient Education  2017 Elsevier Inc.  

## 2018-10-16 ENCOUNTER — Encounter: Payer: Self-pay | Admitting: Internal Medicine

## 2018-10-18 ENCOUNTER — Other Ambulatory Visit: Payer: Self-pay | Admitting: Internal Medicine

## 2018-10-18 DIAGNOSIS — G4733 Obstructive sleep apnea (adult) (pediatric): Secondary | ICD-10-CM

## 2018-10-18 NOTE — Progress Notes (Signed)
This note has already been done.  See the date of service

## 2018-10-18 NOTE — Assessment & Plan Note (Signed)
10 day trial of CPAP confirmed inadequate relief of apneic events.  DME order for auotitration using 6-20 cm H@) written per request from [place]

## 2018-10-18 NOTE — Progress Notes (Signed)
10 day trial of CPAP confirmed inadequate relief of apneic events.  DME order for auotitration using 6-20 cm H@) written per request from [place]

## 2018-10-20 ENCOUNTER — Encounter: Payer: Self-pay | Admitting: Internal Medicine

## 2018-10-22 ENCOUNTER — Other Ambulatory Visit: Payer: Self-pay | Admitting: Internal Medicine

## 2018-10-22 DIAGNOSIS — G4733 Obstructive sleep apnea (adult) (pediatric): Secondary | ICD-10-CM

## 2018-10-24 ENCOUNTER — Ambulatory Visit (INDEPENDENT_AMBULATORY_CARE_PROVIDER_SITE_OTHER): Payer: Medicare Other | Admitting: Internal Medicine

## 2018-10-24 ENCOUNTER — Encounter: Payer: Self-pay | Admitting: Internal Medicine

## 2018-10-24 VITALS — BP 120/72 | HR 94 | Ht 68.75 in | Wt 208.6 lb

## 2018-10-24 DIAGNOSIS — G4733 Obstructive sleep apnea (adult) (pediatric): Secondary | ICD-10-CM

## 2018-10-24 NOTE — Progress Notes (Signed)
[facility] Consultation      Assessment and Plan:  Severe obstructive sleep apnea. - Review of download data shows continued severe sleep apnea with elevated apnea index, this improved slightly with the recent change in pressure from 12 to 5-20, though apnea index continue to be elevated above 15. - Will change auto CPAP range to 10-20, repeat download in 3 to 4 weeks to see how he is doing.  Download shows that the patient is often hitting the ceiling pressure of 20, with a median pressure of 13, 95th percentile pressure of 20.  He may ultimately require an even higher pressure.  Orders Placed This Encounter  Procedures  . Ambulatory Referral for DME    Referral Priority:   Routine    Referral Type:   Durable Medical Equipment Purchase    Number of Visits Requested:   1   Return in about 4 weeks (around 11/21/2018).    Date: 10/24/2018  MRN# [id number] TASHI BAND 25-Oct-1941    Merlene Laughter is a 77 y.o. old male seen in consultation for chief complaint of:    Chief Complaint  Patient presents with  . Consult    referred by Dr. Derrel Nip for sleep apnea  . Sleep Apnea    has new [facility]AP through [facility], recently changed pressure     HPI:   The patient is a 77 year old male who has been diagnosed with obstructive sleep apnea by a test at [facility] on 09/25/2018.  Patient typically goes to bed between 1015 and 10:30 PM.  He falls asleep quickly within 10 minutes.  He got his machine on 10/08/18. Before the test he had been doing a lot of snoring, and his wife wanted him to do a sleep study. He had an HST, which apparently showed OSA. He then went for an in-lab sleep titration study. He has been logging in and finding that his apnea events have remained persistently elevated, he brings the paperwork today and I have reviewed it with him. The AHI appears to remain persistently elevated above 30 but does not differentiate between apneic or central episodes.   In the  meantime his CPAP was increased, and with that the number of events has decreased to 23 to 19 range.   **CPAP download 10/17/2018- 10/23/2018>> raw data present reviewed, CPAP setting is 12, average nightly usage is about 8 hours, apnea index is at or above 30 on most of these nights at the pressure of 12.  Upon increase to a pressure range of 6-20 apnea index decreased to approximately 17.  At this range median pressure is 13, 95th percentile pressure is 20 **CPAP titration study 09/19/18>> events eliminated at CPAP of 12, and he was recommend to be on CPAP at 12.    PMHX:   Past Medical History:  Diagnosis Date  . Arthritis   . Cataract 01/02/2011   Bilateral - s/p excision  . Colon polyps 2000   Hyperplastic - Repeat colonoscopy 2015  . GERD (gastroesophageal reflux disease)    Pantoprazole  . Hemorrhoids 2006  . HLD (hyperlipidemia)   . Iron deficiency anemia 03/20/2006   Donating too much blood  . Migraine    No longer has problems  . Osteopenia    on Fosamax  . RLS (restless legs syndrome) 03/20/2006   2/2 iron deficiency  . Schatzki's ring   . Skin cancer 01/04/2012   Basal cell nose, squamous cell scalp - Dr. Ula Lingo   Surgical Hx:  Past Surgical History:  Procedure Laterality Date  . BREAST BIOPSY Left    benign  . Cataract Surgery Bilateral   . COLONOSCOPY WITH PROPOFOL N/A 04/18/2017   Procedure: COLONOSCOPY WITH PROPOFOL;  Surgeon: Robert Bellow, MD;  Location: [facility] ENDOSCOPY;  Service: Endoscopy;  Laterality: N/A;  . INGUINAL HERNIA REPAIR Left   . ROTATOR CUFF REPAIR Left    Family Hx:  Family History  Problem Relation Age of Onset  . Alcohol abuse Mother   . Arthritis Mother   . Hypertension Mother   . Cancer Father 36       Throat Cancer - died  . Alcohol abuse Father   . Cancer Sister 77       Breast Cancer  . Alcohol abuse Sister    Social Hx:   Social History   Tobacco Use  . Smoking status: Former Smoker    Packs/day: 2.00    Years: 21.00     Pack years: 42.00    Types: Cigarettes  . Smokeless tobacco: Never Used  Substance Use Topics  . Alcohol use: Yes    Comment: Occasional wine glass  . Drug use: No   Medication:    Current Outpatient Medications:  .  alendronate (FOSAMAX) 70 MG tablet, TAKE 1 TABLET ONCE A WEEK WITH A FULL GLASS OF WATER ON AN EMPTY STOMACH, Disp: 12 tablet, Rfl: 0 .  aspirin EC 81 MG tablet, Take 81 mg by mouth daily., Disp: , Rfl:  .  atorvastatin (LIPITOR) 20 MG tablet, TAKE 1 TABLET EVERY DAY, Disp: 90 tablet, Rfl: 1 .  Calcium Citrate-Vitamin D (CITRACAL + D PO), Take 650 mg by mouth daily. 2 tablets daily, Disp: , Rfl:  .  Cholecalciferol (VITAMIN D3) 10000 UNITS TABS, Take 1 tablet by mouth daily., Disp: , Rfl:  .  diclofenac (VOLTAREN) 75 MG EC tablet, Take 1 tablet (75 mg total) by mouth 2 (two) times daily., Disp: 60 tablet, Rfl: 2 .  docusate sodium (STOOL SOFTENER) 100 MG capsule, Take 100 mg by mouth 2 (two) times daily., Disp: , Rfl:  .  famotidine (PEPCID) 40 MG tablet, TAKE 1 TABLET EVERY DAY, Disp: 90 tablet, Rfl: 1 .  fluticasone (FLONASE) 50 MCG/ACT nasal spray, USE 2 SPRAYS IN EACH NOSTRIL EVERY DAY, Disp: 48 g, Rfl: 2 .  meclizine (ANTIVERT) 12.5 MG tablet, TAKE 1 TABLET THREE TIMES DAILY AS NEEDED FOR DIZZINESS, Disp: 90 tablet, Rfl: 0   Allergies:  Patient has no known allergies.  Review of Systems: Gen:  Denies  fever, sweats, chills HEENT: Denies blurred vision, double vision. bleeds, sore throat Cvc:  No dizziness, chest pain. Resp:   Denies cough or sputum production, shortness of breath Gi: Denies swallowing difficulty, stomach pain. Gu:  Denies bladder incontinence, burning urine Ext:   No Joint pain, stiffness. Skin: No skin rash,  hives  Endoc:  No polyuria, polydipsia. Psych: No depression, insomnia. Other:  All other systems were reviewed with the patient and were negative other that what is mentioned in the HPI.   Physical Examination:   VS: BP 120/72 (BP  Location: Left Arm, Cuff Size: Normal)   Pulse 94   Ht 5' 8.75" (1.746 m)   Wt 208 lb 9.6 oz (94.6 kg)   SpO2 100%   BMI 31.03 kg/m   General Appearance: No distress  Neuro:without focal findings,  speech normal,  HEENT: PERRLA, EOM intact.   Pulmonary: normal breath sounds, No wheezing.  CardiovascularNormal S1,S2.  No  m/r/g.   Abdomen: Benign, Soft, non-tender. Renal:  No costovertebral tenderness  GU:  No performed at this time. Endoc: No evident thyromegaly, no signs of acromegaly. Skin:   warm, no rashes, no ecchymosis  Extremities: normal, no cyanosis, clubbing.  Other findings:    LABORATORY PANEL:   CBC No results for input(s): WBC, HGB, HCT, PLT in the last 168 hours. ------------------------------------------------------------------------------------------------------------------  Chemistries  No results for input(s): NA, K, CL, CO2, GLUCOSE, BUN, CREATININE, CALCIUM, MG, AST, ALT, ALKPHOS, BILITOT in the last 168 hours.  Invalid input(s): GFRCGP ------------------------------------------------------------------------------------------------------------------  Cardiac Enzymes No results for input(s): TROPONINI in the last 168 hours. ------------------------------------------------------------  RADIOLOGY:  No results found.     Thank  you for the consultation and for allowing [facility] to assist in the care of your patient. Our recommendations are noted above.  Please contact us if we can be of further service.   Marda Stalker, M.D., F.C.C.P.  Board Certified in Internal Medicine, Pulmonary Medicine, [place], and Sleep Medicine.  [facility] Office Number: [phone]   10/24/2018

## 2018-10-24 NOTE — Patient Instructions (Signed)
Will change range of pressure to 10-20 cm H2O.

## 2018-11-05 ENCOUNTER — Ambulatory Visit: Payer: Medicare Other

## 2018-11-07 ENCOUNTER — Ambulatory Visit (INDEPENDENT_AMBULATORY_CARE_PROVIDER_SITE_OTHER): Payer: Medicare Other

## 2018-11-07 VITALS — BP 120/76 | HR 60 | Temp 97.6°F | Resp 14 | Ht 69.0 in | Wt 211.0 lb

## 2018-11-07 DIAGNOSIS — Z0000 Encounter for general adult medical examination without abnormal findings: Secondary | ICD-10-CM | POA: Diagnosis not present

## 2018-11-07 NOTE — Progress Notes (Signed)
Subjective:   Jamie Zhang is a 77 y.o. male who presents for Medicare Annual/Subsequent preventive examination.  Review of Systems:  No ROS.  Medicare Wellness Visit. Additional risk factors are reflected in the social history. Cardiac Risk Factors include: advanced age (>9men, >29 women);male gender     Objective:    Vitals: BP 120/76 (BP Location: Right Arm, Patient Position: Sitting, Cuff Size: Normal)   Pulse 60   Temp 97.6 F (36.4 C) (Oral)   Resp 14   Ht 5\' 9"  (1.753 m)   Wt 211 lb (95.7 kg)   SpO2 98%   BMI 31.16 kg/m   Body mass index is 31.16 kg/m.  Advanced Directives 11/07/2018 11/02/2017 04/18/2017 11/02/2016 10/03/2016  Does Patient Have a Medical Advance Directive? Yes Yes Yes Yes No  Type of Paramedic of [place];Living will Living will;Healthcare Power of Attorney Living will [place];Living will -  Does patient want to make changes to medical advance directive? No - Patient declined No - Patient declined - No - Patient declined -  Copy of [place] in Chart? Yes - validated most recent copy scanned in chart (See row information) Yes - No - copy requested -    Tobacco Social History   Tobacco Use  Smoking Status Former Smoker  . Packs/day: 2.00  . Years: 21.00  . Pack years: 42.00  . Types: Cigarettes  Smokeless Tobacco Never Used     Counseling given: Not Answered   Clinical Intake:  Pre-visit preparation completed: Yes  Pain : No/denies pain     Diabetes: No  How often do you need to have someone help you when you read instructions, pamphlets, or other written materials from your doctor or pharmacy?: 1 - Never  Interpreter Needed?: No     Past Medical History:  Diagnosis Date  . Arthritis   . Cataract 01/02/2011   Bilateral - s/p excision  . Colon polyps 2000   Hyperplastic - Repeat colonoscopy 2015  . GERD (gastroesophageal reflux disease)    Pantoprazole  .  Hemorrhoids 2006  . HLD (hyperlipidemia)   . Iron deficiency anemia 03/20/2006   Donating too much blood  . Migraine    No longer has problems  . Osteopenia    on Fosamax  . RLS (restless legs syndrome) 03/20/2006   2/2 iron deficiency  . Schatzki's ring   . Skin cancer 01/04/2012   Basal cell nose, squamous cell scalp - Dr. Ula Lingo   Past Surgical History:  Procedure Laterality Date  . BREAST BIOPSY Left    benign  . Cataract Surgery Bilateral   . COLONOSCOPY WITH PROPOFOL N/A 04/18/2017   Procedure: COLONOSCOPY WITH PROPOFOL;  Surgeon: Robert Bellow, MD;  Location: [facility] ENDOSCOPY;  Service: Endoscopy;  Laterality: N/A;  . INGUINAL HERNIA REPAIR Left   . ROTATOR CUFF REPAIR Left    Family History  Problem Relation Age of Onset  . Alcohol abuse Mother   . Arthritis Mother   . Hypertension Mother   . Cancer Father 26       Throat Cancer - died  . Alcohol abuse Father   . Cancer Sister 24       Breast Cancer  . Alcohol abuse Sister    Social History   Socioeconomic History  . Marital status: Married    Spouse name: Not on file  . Number of children: 2  . Years of education: 66  . Highest education  level: Not on file  Occupational History  . Occupation: Freight forwarder    Comment: Worked for [employer]  . Occupation: Personal assistant  . Occupation: Freight forwarder for [employer]  . Financial resource strain: Not hard at all  . Food insecurity:    Worry: Never true    Inability: Never true  . Transportation needs:    Medical: No    Non-medical: No  Tobacco Use  . Smoking status: Former Smoker    Packs/day: 2.00    Years: 21.00    Pack years: 42.00    Types: Cigarettes  . Smokeless tobacco: Never Used  Substance and Sexual Activity  . Alcohol use: Yes    Comment: Occasional wine glass  . Drug use: No  . Sexual activity: Never  Lifestyle  . Physical activity:    Days per week: 6 days    Minutes per session: 150+ min  . Stress: Not at all    Relationships  . Social connections:    Talks on phone: Patient refused    Gets together: Patient refused    Attends religious service: Patient refused    Active member of club or organization: Patient refused    Attends meetings of clubs or organizations: Patient refused    Relationship status: Patient refused  Other Topics Concern  . Not on file  Social History Narrative   Mr. Kiedrowski grew up in [place], [place]. He joined the [employer] right out of [employer] and served for 2.5 years then was active in [employer]. 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 He then worked in [employer] and afterward became a Freight forwarder for [employer]. He recently moved to [place] with his wife. They have two daughters that are still living in [place]. As a hobby he enjoys woodwork mainly with cabinet work.     Outpatient Encounter Medications as of 11/07/2018  Medication Sig  . alendronate (FOSAMAX) 70 MG tablet TAKE 1 TABLET ONCE A WEEK WITH A FULL GLASS OF WATER ON AN EMPTY STOMACH  . aspirin EC 81 MG tablet Take 81 mg by mouth daily.  [name] atorvastatin (LIPITOR) 20 MG tablet TAKE 1 TABLET EVERY DAY  . Calcium Citrate-Vitamin D (CITRACAL + D PO) Take 650 mg by mouth daily. 2 tablets daily  . Cholecalciferol (VITAMIN D3) 10000 UNITS TABS Take 1 tablet by mouth daily.  . diclofenac (VOLTAREN) 75 MG EC tablet Take 1 tablet (75 mg total) by mouth 2 (two) times daily.  [name] docusate sodium (STOOL SOFTENER) 100 MG capsule Take 100 mg by mouth 2 (two) times daily.  . famotidine (PEPCID) 40 MG tablet TAKE 1 TABLET EVERY DAY  . fluticasone (FLONASE) 50 MCG/ACT nasal spray USE 2 SPRAYS IN EACH NOSTRIL EVERY DAY  . meclizine (ANTIVERT) 12.5 MG tablet TAKE 1 TABLET THREE TIMES DAILY AS NEEDED FOR DIZZINESS   No facility-administered encounter medications on file as of 11/07/2018.     Activities of Daily Living In your present state of health, do you have any  difficulty performing the following activities: 11/07/2018  Hearing? N  Vision? N  Difficulty concentrating or making decisions? N  Walking or climbing stairs? N  Dressing or bathing? N  Doing errands, shopping? N  Preparing Food and eating ? N  Using the Toilet? N  In the past six months, have you accidently leaked urine? N  Do you have problems with loss of  bowel control? N  Managing your Medications? N  Managing your Finances? N  Housekeeping or managing your Housekeeping? N  Some recent data might be hidden    Patient Care Team: Crecencio Mc, MD as PCP - General (Internal Medicine) Crecencio Mc, MD (Internal Medicine) Bary Castilla Forest Gleason, MD (General Surgery)   Assessment:   This is a routine wellness examination for [employer].  Health Screenings  Colonoscopy-04/18/17 PSA -11/03/15 (0.86) Glaucoma-none Hearing-passes the whisper test Hemoglobin A1C-08/19/18 (5.4) Cholesterol -02/15/18 (120)  Social  Alcohol intake -no Smoking history- former Smokers in home? none Illicit drug use? no Exercise-walking, treadmill 6 days weekly, 30 minutes Diet-regular Sexually [place]  Patient feels safe at home.  Patient does have smoke detectors at home  Patient does wear sunscreen or protective clothing when in direct sunlight. Patient does wear seat belt when driving or riding with others.   Activities of Daily Living Patient can do their own household chores. Denies needing assistance with: driving, feeding themselves, getting from bed to chair, getting to the toilet, bathing/showering, dressing, managing money, climbing flight of stairs, or preparing meals.   Depression Screen Patient denies losing interest in daily life, feeling hopeless, or crying easily over simple problems.  Fall Screen Patient denies being afraid of falling or falling in the last year.   Memory Screen Patient denies problems with memory, misplacing items, and is able to balance  checkbook/bank accounts.  Patient is alert, normal appearance, oriented to person/place/and time. Correctly identified the president of the [place], recall of 2/3 objects, and performing simple calculations.  Patient displays appropriate judgement and can read correct time from watch face.   Immunizations The following Immunizations are up to date: Influenza, shingles, pneumonia, and tetanus.   Other Providers Patient Care Team: Crecencio Mc, MD as PCP - General (Internal Medicine) Crecencio Mc, MD (Internal Medicine) Robert Bellow, MD (General Surgery)   Exercise Activities and Dietary recommendations Current Exercise Habits: Home exercise routine, Type of exercise: walking, Time (Minutes): 20, Frequency (Times/Week): 5, Weekly Exercise (Minutes/Week): 100, Intensity: Mild  Goals    . Low Carb Diet     Healthy diet; portion control       Fall Risk Fall Risk  11/07/2018 09/30/2018 11/02/2017 11/02/2016 10/19/2016  Falls in the past year? 0 0 No No No  Comment - Emmi Telephone Survey: data to providers prior to load - - Emmi Telephone Survey: data to providers prior to load   Depression Screen PHQ 2/9 Scores 11/07/2018 11/02/2017 11/02/2016 11/03/2015  PHQ - 2 Score 0 0 0 0    Cognitive Function MMSE - Mini Mental State Exam 11/02/2016  Orientation to time 5  Orientation to Place 5  Registration 3  Attention/ Calculation 5  Recall 3  Language- name 2 objects 2  Language- repeat 1  Language- follow 3 step command 3  Language- read & follow direction 1  Write a sentence 1  Copy design 1  Total score 30     6CIT Screen 11/07/2018 11/02/2017  What Year? 0 points 0 points  What month? 0 points 0 points  What time? 0 points 0 points  Count back from 20 0 points 0 points  Months in reverse 0 points 0 points  Repeat phrase 0 points 0 points  Total Score 0 0    Immunization History  Administered Date(s) Administered  . Influenza Split 09/01/2015  .  Influenza, High Dose Seasonal PF 08/14/2016, 08/19/2018  . Influenza-Unspecified 08/25/2014, 08/01/2017  .  Pneumococcal Conjugate-13 05/04/2015  . Pneumococcal Polysaccharide-23 09/19/2005  . Tdap 08/08/2010  . Zoster Recombinat (Shingrix) 07/10/2018, 09/18/2018   Screening Tests Health Maintenance  Topic Date Due  . COLONOSCOPY  04/18/2020  . TETANUS/TDAP  08/08/2020  . INFLUENZA VACCINE  Completed  . PNA vac Low Risk Adult  Completed      Plan:    End of life planning; Advance aging; Advanced directives discussed. Copy of current HCPOA/Living Will on file.    I have personally reviewed and noted the following in the patient's chart:   . Medical and social history . Use of alcohol, tobacco or illicit drugs  . Current medications and supplements . Functional ability and status . Nutritional status . Physical activity . Advanced directives . List of other physicians . Hospitalizations, surgeries, and ER visits in previous 12 months . Vitals . Screenings to include cognitive, depression, and falls . Referrals and appointments  In addition, I have reviewed and discussed with patient certain preventive protocols, quality metrics, and best practice recommendations. A written personalized care plan for preventive services as well as general preventive health recommendations were provided to patient.     Varney Biles, LPN  [id number]

## 2018-11-07 NOTE — Patient Instructions (Addendum)
  Jamie Zhang , Thank you for taking time to come for your Medicare Wellness Visit. I appreciate your ongoing commitment to your health goals. Please review the following plan we discussed and let me know if I can assist you in the future.   These are the goals we discussed: Goals    . Low Carb Diet     Healthy diet; portion control       This is a list of the screening recommended for you and due dates:  Health Maintenance  Topic Date Due  . Colon Cancer Screening  04/18/2020  . Tetanus Vaccine  08/08/2020  . Flu Shot  Completed  . Pneumonia vaccines  Completed

## 2018-11-07 NOTE — Progress Notes (Signed)
Agree with below   TMS 

## 2018-11-14 ENCOUNTER — Encounter: Payer: Self-pay | Admitting: Internal Medicine

## 2018-11-19 ENCOUNTER — Ambulatory Visit (INDEPENDENT_AMBULATORY_CARE_PROVIDER_SITE_OTHER): Payer: Medicare Other | Admitting: Internal Medicine

## 2018-11-19 ENCOUNTER — Encounter: Payer: Self-pay | Admitting: Internal Medicine

## 2018-11-19 VITALS — BP 122/70 | HR 57 | Ht 68.75 in | Wt 209.8 lb

## 2018-11-19 DIAGNOSIS — G4733 Obstructive sleep apnea (adult) (pediatric): Secondary | ICD-10-CM | POA: Diagnosis not present

## 2018-11-19 NOTE — Progress Notes (Signed)
[facility] Consultation      Assessment and Plan:  Severe obstructive sleep apnea. - Review of download data shows continued severe sleep apnea with elevated apnea index, this improved slightly with the recent change in pressure from 12 to 5-20, though apnea index continue to be elevated above 15. - Will change auto CPAP range to 15-25, repeat download in 3 to 4 weeks to see how he is doing.  Download shows that the patient is often hitting the ceiling pressure of 20, with a median pressure of 15, 95th percentile pressure of 20.    Return in about 3 months (around 02/18/2019).    Date: 11/19/2018  MRN# [id number] COWAN PILAR [birth year]    Merlene Laughter is a 78 y.o. old male seen in consultation for chief complaint of:    Chief Complaint  Patient presents with  . Follow-up    issues with mask-fit     HPI:   The patient is a 78 year old male who has been diagnosed with obstructive sleep apnea by a test at sleep med on 09/25/2018.  Patient typically goes to bed between 1015 and 10:30 PM.  He falls asleep quickly within 10 minutes.  He got his machine on 10/08/18. Before the test he had been doing a lot of snoring, and his wife wanted him to do a sleep study. He had an HST, which apparently showed OSA. He then went for an in-lab sleep titration study. He has been logging in and finding that his apnea events have remained persistently elevated, he brings the paperwork today and I have reviewed it with him. The AHI appears to remain persistently elevated above 30 but does not differentiate between apneic or central episodes.   In the meantime his CPAP was increased, and with that the number of events has decreased to 23 to 19 range.   **CPAP download data 10/22/2018-11/14/2018>> uses greater than 4 hours is 24/24 days.  Average usage on days used is 7 hours 47 minutes.  Pressure ranges 10-20, median pressure 15, 95th percentile pressure 19, maximum pressure 19.8.  Residual  AHI is 18.6 with an apnea index of 14, central index of 3.  Leaks are within normal limits. **CPAP download 10/17/2018- 10/23/2018>> raw data present reviewed, CPAP setting is 12, average nightly usage is about 8 hours, apnea index is at or above 30 on most of these nights at the pressure of 12.  Upon increase to a pressure range of 6-20 apnea index decreased to approximately 17.  At this range median pressure is 13, 95th percentile pressure is 20 **CPAP titration study 09/19/18>> events eliminated at CPAP of 12, and he was recommend to be on CPAP at 12.    Medication:    Current Outpatient Medications:  .  alendronate (FOSAMAX) 70 MG tablet, TAKE 1 TABLET ONCE A WEEK WITH A FULL GLASS OF WATER ON AN EMPTY STOMACH, Disp: 12 tablet, Rfl: 0 .  aspirin EC 81 MG tablet, Take 81 mg by mouth daily., Disp: , Rfl:  .  atorvastatin (LIPITOR) 20 MG tablet, TAKE 1 TABLET EVERY DAY, Disp: 90 tablet, Rfl: 1 .  Calcium Citrate-Vitamin D (CITRACAL + D PO), Take 650 mg by mouth daily. 2 tablets daily, Disp: , Rfl:  .  Cholecalciferol (VITAMIN D3) 10000 UNITS TABS, Take 1 tablet by mouth daily., Disp: , Rfl:  .  diclofenac (VOLTAREN) 75 MG EC tablet, Take 1 tablet (75 mg total) by mouth 2 (two) times daily., Disp: 60  tablet, Rfl: 2 .  docusate sodium (STOOL SOFTENER) 100 MG capsule, Take 100 mg by mouth 2 (two) times daily., Disp: , Rfl:  .  famotidine (PEPCID) 40 MG tablet, TAKE 1 TABLET EVERY DAY, Disp: 90 tablet, Rfl: 1 .  fluticasone (FLONASE) 50 MCG/ACT nasal spray, USE 2 SPRAYS IN EACH NOSTRIL EVERY DAY, Disp: 48 g, Rfl: 2 .  meclizine (ANTIVERT) 12.5 MG tablet, TAKE 1 TABLET THREE TIMES DAILY AS NEEDED FOR DIZZINESS, Disp: 90 tablet, Rfl: 0   Allergies:  Patient has no known allergies.  Review of Systems:  Constitutional: Feels well. Cardiovascular: Denies chest pain, exertional chest pain.  Pulmonary: Denies hemoptysis, pleuritic chest pain.   The remainder of systems were reviewed and were found to  be negative other than what is documented in the HPI.    Physical Examination:   VS: BP 122/70 (BP Location: Left Arm, Cuff Size: Normal)   Pulse (!) 57   SpO2 96%   General Appearance: No distress  Neuro:without focal findings, mental status, speech normal, alert and oriented HEENT: PERRLA, EOM intact Pulmonary: No wheezing, No rales  CardiovascularNormal S1,S2.  No m/r/g.  Abdomen: Benign, Soft, non-tender, No masses Renal:  No costovertebral tenderness  GU:  No performed at this time. Endoc: No evident thyromegaly, no signs of acromegaly or Cushing features Skin:   warm, no rashes, no ecchymosis  Extremities: normal, no cyanosis, clubbing.      LABORATORY PANEL:   CBC No results for input(s): WBC, HGB, HCT, PLT in the last 168 hours. ------------------------------------------------------------------------------------------------------------------  Chemistries  No results for input(s): NA, K, CL, CO2, GLUCOSE, BUN, CREATININE, CALCIUM, MG, AST, ALT, ALKPHOS, BILITOT in the last 168 hours.  Invalid input(s): GFRCGP ------------------------------------------------------------------------------------------------------------------  Cardiac Enzymes No results for input(s): TROPONINI in the last 168 hours. ------------------------------------------------------------  RADIOLOGY:  No results found.     Thank  you for the consultation and for allowing [facility] to assist in the care of your patient. Our recommendations are noted above.  Please contact us if we can be of further service.   Marda Stalker, M.D., F.C.C.P.  Board Certified in Internal Medicine, Pulmonary Medicine, [place], and Sleep Medicine.  [facility] Office Number: [phone]   11/19/2018

## 2018-11-19 NOTE — Patient Instructions (Addendum)
Will increase pressure to 15-25 cm H2O.  Continue to use CPAP every night.  Will recheck download in 3 months.

## 2018-11-19 NOTE — Addendum Note (Signed)
Addended by: Darreld Mclean on: 11/19/2018 03:19 PM   Modules accepted: Orders

## 2018-11-22 ENCOUNTER — Other Ambulatory Visit: Payer: Self-pay

## 2018-11-22 ENCOUNTER — Ambulatory Visit (INDEPENDENT_AMBULATORY_CARE_PROVIDER_SITE_OTHER): Payer: Medicare Other | Admitting: Internal Medicine

## 2018-11-22 ENCOUNTER — Ambulatory Visit (INDEPENDENT_AMBULATORY_CARE_PROVIDER_SITE_OTHER): Payer: Medicare Other

## 2018-11-22 ENCOUNTER — Encounter: Payer: Self-pay | Admitting: Internal Medicine

## 2018-11-22 VITALS — BP 114/74 | HR 48 | Temp 97.9°F | Resp 14 | Ht 68.75 in | Wt 210.0 lb

## 2018-11-22 DIAGNOSIS — M549 Dorsalgia, unspecified: Secondary | ICD-10-CM | POA: Diagnosis not present

## 2018-11-22 DIAGNOSIS — E663 Overweight: Secondary | ICD-10-CM | POA: Diagnosis not present

## 2018-11-22 DIAGNOSIS — G8929 Other chronic pain: Secondary | ICD-10-CM

## 2018-11-22 DIAGNOSIS — M546 Pain in thoracic spine: Secondary | ICD-10-CM | POA: Diagnosis not present

## 2018-11-22 DIAGNOSIS — G4733 Obstructive sleep apnea (adult) (pediatric): Secondary | ICD-10-CM

## 2018-11-22 NOTE — Patient Instructions (Signed)
Losing 10% of your body weight WILL improve  Your sleep apnea  PORTION SIZE REDUCTION ( moderation in all things) is all you need to start with   plain x rays of thoracic spine today

## 2018-11-22 NOTE — Progress Notes (Signed)
Subjective:  Patient ID: Jamie Zhang, male    DOB: [birth year]  Age: 78 y.o. MRN: [id number]  CC: The primary encounter diagnosis was Chronic midline thoracic back pain. Diagnoses of OSA (obstructive sleep apnea) and Overweight (BMI 25.0-29.9) were also pertinent to this visit.  HPI Jamie Zhang presents for follow up on OSA, severe , diagnosed recently  By home sleep study,  Titration study was done at facility. . Patient has been using  CPAP  With continued elevated AHI .  He was seen by pulmonology on Monday and his settings were changed to an auto CPAP range of 15-25  With repeat download planned in 3-4 weeks  .  he has not had the changes made to his settings because they are made remotely via modem.     Received a new mask on Dec 29 and the AHI has improved but is not at goal .  Mask was leaking,  But Medicare would not pay for a new mask because the change was only in size   So he has a new hybrid mask that is not leaking as much   He is compliant with mask usage. Does not feel better yet  Still waking up tired.   Waking up with back pain in the mid thoracic spine.  Thinks it is due to his mattress.          Outpatient Medications Prior to Visit  Medication Sig Dispense Refill  . alendronate (FOSAMAX) 70 MG tablet TAKE 1 TABLET ONCE A WEEK WITH A FULL GLASS OF WATER ON AN EMPTY STOMACH 12 tablet 0  . aspirin EC 81 MG tablet Take 81 mg by mouth daily.    [name] atorvastatin (LIPITOR) 20 MG tablet TAKE 1 TABLET EVERY DAY 90 tablet 1  . Calcium Citrate-Vitamin D (CITRACAL + D PO) Take 650 mg by mouth daily. 2 tablets daily    . Cholecalciferol (VITAMIN D3) 10000 UNITS TABS Take 1 tablet by mouth daily.    . diclofenac (VOLTAREN) 75 MG EC tablet Take 1 tablet (75 mg total) by mouth 2 (two) times daily. 60 tablet 2  . docusate sodium (STOOL SOFTENER) 100 MG capsule Take 100 mg by mouth 2 (two) times daily.    . famotidine (PEPCID) 40 MG tablet TAKE 1 TABLET EVERY DAY 90 tablet 1  .  fluticasone (FLONASE) 50 MCG/ACT nasal spray USE 2 SPRAYS IN EACH NOSTRIL EVERY DAY 48 g 2  . meclizine (ANTIVERT) 12.5 MG tablet TAKE 1 TABLET THREE TIMES DAILY AS NEEDED FOR DIZZINESS 90 tablet 0   No facility-administered medications prior to visit.     Review of Systems;  Patient denies headache, fevers, malaise, unintentional weight loss, skin rash, eye pain, sinus congestion and sinus pain, sore throat, dysphagia,  hemoptysis , cough, dyspnea, wheezing, chest pain, palpitations, orthopnea, edema, abdominal pain, nausea, melena, diarrhea, constipation, flank pain, dysuria, hematuria, urinary  Frequency, nocturia, numbness, tingling, seizures,  Focal weakness, Loss of consciousness,  Tremor, insomnia, depression, anxiety, and suicidal ideation.      Objective:  BP 114/74 (BP Location: Left Arm, Patient Position: Sitting, Cuff Size: Normal)   Pulse (!) 48   Temp 97.9 F (36.6 C) (Oral)   Resp 14   Ht 5' 8.75" (1.746 m)   Wt 210 lb (95.3 kg)   SpO2 94%   BMI 31.24 kg/m   BP Readings from Last 3 Encounters:  11/22/18 114/74  11/19/18 122/70  11/07/18 120/76    Wt Readings  from Last 3 Encounters:  11/22/18 210 lb (95.3 kg)  11/19/18 209 lb 12.8 oz (95.2 kg)  11/07/18 211 lb (95.7 kg)    General appearance: alert, cooperative and appears stated age Ears: normal TM's and external ear canals both ears Throat: lips, mucosa, and tongue normal; teeth and gums normal Neck: no adenopathy, no carotid bruit, supple, symmetrical, trachea midline and thyroid not enlarged, symmetric, no tenderness/mass/nodules Back: symmetric, no curvature. ROM normal. No CVA tenderness. Lungs: clear to auscultation bilaterally Heart: regular rate and rhythm, S1, S2 normal, no murmur, click, rub or gallop Abdomen: soft, non-tender; bowel sounds normal; no masses,  no organomegaly Pulses: 2+ and symmetric Skin: Skin color, texture, turgor normal. No rashes or lesions Lymph nodes: Cervical,  supraclavicular, and axillary nodes normal.  Lab Results  Component Value Date   HGBA1C 5.4 08/19/2018   HGBA1C 5.8 02/15/2018   HGBA1C 5.7 08/31/2017    Lab Results  Component Value Date   CREATININE 0.9 07/10/2018   CREATININE 0.93 02/15/2018   CREATININE 0.99 08/31/2017    Lab Results  Component Value Date   WBC 8.9 07/10/2018   HGB 15.9 07/10/2018   HCT 47 07/10/2018   PLT 177 07/10/2018   GLUCOSE 96 02/15/2018   CHOL 120 02/15/2018   TRIG 74.0 02/15/2018   HDL 55.80 02/15/2018   LDLDIRECT 47.0 12/24/2015   LDLCALC 50 02/15/2018   ALT 16 07/10/2018   AST 16 07/10/2018   NA 140 07/10/2018   K 3.9 07/10/2018   CL 106 02/15/2018   CREATININE 0.9 07/10/2018   BUN 17 07/10/2018   CO2 28 02/15/2018   TSH 3.21 07/10/2018   PSA 0.86 11/03/2015   HGBA1C 5.4 08/19/2018    Dg Bone Density  Result Date: 05/02/2018 EXAM: DUAL X-RAY ABSORPTIOMETRY (DXA) FOR BONE MINERAL DENSITY IMPRESSION: Dear Dr. Derrel Nip, Your patient Jamie Zhang completed a BMD test on 05/02/2018 using the [place] (analysis version: 14.10) manufactured by [employer]. The following summarizes the results of our evaluation. PATIENT BIOGRAPHICAL: Name: Jamie Zhang Patient ID: [id number] Birth Date: 1941/07/07 Height: 68.5 in. Gender: Male Exam Date: 05/02/2018 Weight: 201.9 lbs. Indications: Advanced Age, Caucasian Fractures: Treatments: Calcium, Fosamax, Vitamin D ASSESSMENT: The BMD measured at Femur Neck Right is 0.897 g/cm2 with a T-score of -1.3. This patient is considered osteopenia according to the [facility]) criteria. Lumbar spine was not utilized due to advanced degenerative changes. Patient is not a candidate for FRAX due to Fosamax. Site Region Measured Measured WHO Young Adult BMD Date       Age      Classification T-score DualFemur Neck Right 05/02/2018 78.0 Oste[age] -1.3 0.897 g/cm2 DualFemur Neck Right 06/16/2015 75.1 Osteopenia -1.2 0.910 g/cm2 DualFemur Total Mean  05/02/2018 78.0 Normal -1.0 0.953 g/cm2 DualFemur Total Mean 06/16/2015 75.1 Normal -1.0 0.959 g/cm2 Left Forearm Radius 33% 05/02/2018 78.0 Normal -0.4 0.945 g/cm2 Left Forearm Radius 33% 06/16/2015 75.1 Normal -0.6 0.934 g/cm2 World Health Organization [facility]) criteria for post-menopausal, Caucasian Women: Normal:       T-score at or above -1 SD Osteopenia:   T-score between -1 and -2.5 SD Osteoporosis: T-score at or below -2.5 SD RECOMMENDATIONS: 1. All patients should optimize calcium and vitamin D intake. 2. Consider FDA-approved medical therapies in postmenopausal women and men aged 31 years and older, based on the following: a. A hip or vertebral(clinical or morphometric) fracture b. T-score < -2.5 at the femoral neck or spine after appropriate evaluation to exclude secondary  causes c. Low bone mass (T-score between -1.0 and -2.5 at the femoral neck or spine) and a 10-year probability of a hip fracture > 3% or a 10-year probability of a major osteoporosis-related fracture > 20% based on the [place]-adapted WHO algorithm d. Clinician judgment and/or patient preferences may indicate treatment for people with 10-year fracture probabilities above or below these levels FOLLOW-UP: People with diagnosed cases of osteoporosis or osteopenia should be regularly tested for bone mineral density. For patients eligible for Medicare, routine testing is allowed once every 2 years. The testing frequency can be increased to one year for patients who have rapidly progressing disease, or for those who are receiving medical therapy to restore bone mass. I have reviewed this report, and agree with the above findings. [facility] Electronically Signed   By: Lowella Grip III M.D.   On: 05/02/2018 14:01    Assessment & Plan:   Problem List Items Addressed This Visit    Midline thoracic back pain - Primary    Plain films done and no fractures or alignment issues.  Anterior vertebral endplate spurring noted.        Relevant Orders   DG Thoracic Spine W/Swimmers (Completed)   OSA (obstructive sleep apnea)    Severe,  With recent initiation of CPAP.  Control is problematic due to difficulty with mask and need for pressure titration  pulmonology managing      Overweight (BMI 25.0-29.9)    I have addressed  BMI and recommended wt loss of 10% of body weight over the next 6 months using portion size reduction, a low glycemic index diet and regular exercise a minimum of 5 days per week.           I am having Jamie Zhang maintain his Calcium Citrate-Vitamin D (CITRACAL + D PO), Vitamin D3, aspirin EC, docusate sodium, diclofenac, atorvastatin, famotidine, fluticasone, meclizine, and alendronate.  No orders of the defined types were placed in this encounter.   There are no discontinued medications.  Follow-up: No follow-ups on file.   Crecencio Mc, MD

## 2018-11-24 DIAGNOSIS — M546 Pain in thoracic spine: Secondary | ICD-10-CM | POA: Insufficient documentation

## 2018-11-24 NOTE — Assessment & Plan Note (Signed)
Severe,  With recent initiation of CPAP.  Control is problematic due to difficulty with mask and need for pressure titration  pulmonology managing

## 2018-11-24 NOTE — Assessment & Plan Note (Signed)
I have addressed  BMI and recommended wt loss of 10% of body weight over the next 6 months using portion size reduction, a low glycemic index diet and regular exercise a minimum of 5 days per week.

## 2018-11-24 NOTE — Assessment & Plan Note (Signed)
Plain films done and no fractures or alignment issues.  Anterior vertebral endplate spurring noted.

## 2018-11-28 ENCOUNTER — Emergency Department
Admission: EM | Admit: 2018-11-28 | Discharge: 2018-11-28 | Disposition: A | Discharge status: Home / Self Care | Payer: Medicare Other | Attending: Emergency Medicine | Admitting: Emergency Medicine

## 2018-11-28 ENCOUNTER — Encounter: Payer: Self-pay | Admitting: Emergency Medicine

## 2018-11-28 DIAGNOSIS — J101 Influenza due to other identified influenza virus with other respiratory manifestations: Secondary | ICD-10-CM

## 2018-11-28 DIAGNOSIS — Z87891 Personal history of nicotine dependence: Secondary | ICD-10-CM | POA: Insufficient documentation

## 2018-11-28 DIAGNOSIS — R05 Cough: Secondary | ICD-10-CM | POA: Diagnosis not present

## 2018-11-28 DIAGNOSIS — R509 Fever, unspecified: Secondary | ICD-10-CM | POA: Diagnosis present

## 2018-11-28 DIAGNOSIS — Z79899 Other long term (current) drug therapy: Secondary | ICD-10-CM | POA: Diagnosis not present

## 2018-11-28 LAB — INFLUENZA PANEL BY PCR (TYPE A & B)
Influenza A By PCR: POSITIVE — AB
Influenza B By PCR: NEGATIVE

## 2018-11-28 MED ORDER — OSELTAMIVIR PHOSPHATE 75 MG PO CAPS: 75.0000 mg | ORAL_CAPSULE | Freq: Two times a day (BID) | ORAL | 0 refills | Status: AC

## 2018-11-28 NOTE — ED Triage Notes (Signed)
Patient ambulatory to triage with steady gait, without difficulty or distress noted, mask in place; pt reports cough, congestion and fever x 2 days

## 2018-11-28 NOTE — ED Notes (Signed)
ED Provider at bedside. 

## 2018-11-28 NOTE — ED Provider Notes (Signed)
[facility] Emergency Department Provider Note  ____________________________________________   First MD Initiated Contact with Patient 11/28/18 0602     (approximate)  I have reviewed the triage vital signs and the nursing notes.   HISTORY  Chief Complaint Cough and Nasal Congestion    HPI Jamie Zhang is a 78 y.o. male with no contributory past medical history who presents for evaluation of about 2 days of subjective fever, nasal congestion, general malaise, and nonproductive cough.  He states he feels like he may have the flu and wanted to get it checked out.  He reports that he did receive a flu vaccination this year.  He has had no body aches.  He denies shortness of breath, neck pain, neck stiffness, chest pain, nausea, vomiting, and abdominal pain.  He reports the symptoms are moderate.  It made it difficult for him to sleep because he uses a CPAP at night and he is too congested to be able to use it effectively.  Nothing in particular makes him feel better or worse.  Past Medical History:  Diagnosis Date  . Arthritis   . Cataract 01/02/2011   Bilateral - s/p excision  . Colon polyps 2000   Hyperplastic - Repeat colonoscopy 2015  . GERD (gastroesophageal reflux disease)    Pantoprazole  . Hemorrhoids 2006  . HLD (hyperlipidemia)   . Iron deficiency anemia 03/20/2006   Donating too much blood  . Migraine    No longer has problems  . Osteopenia    on Fosamax  . RLS (restless legs syndrome) 03/20/2006   2/2 iron deficiency  . Schatzki's ring   . Skin cancer 01/04/2012   Basal cell nose, squamous cell scalp - Dr. Ula Lingo    Patient Active Problem List   Diagnosis Date Noted  . Midline thoracic back pain 11/24/2018  . OSA (obstructive sleep apnea) 09/10/2018  . Bradycardia, sinus 08/20/2018  . History of vertebral compression fracture 08/19/2017  . History of colonic polyps 03/13/2017  . Tubular adenoma of colon 02/15/2017  . Chronic midline  low back pain without sciatica 02/05/2016  . BPH associated with nocturia 02/05/2016  . Diverticulosis of colon without hemorrhage 12/05/2015  . Atherosclerosis of aorta without gangrene ([place]) 11/15/2015  . Encounter for preventive health examination 11/05/2015  . Prediabetes 05/05/2015  . Overweight (BMI 25.0-29.9) 05/05/2015  . GERD (gastroesophageal reflux disease) 11/03/2014  . History of varicella infection 11/02/2014  . Abdominal aortic aneurysm ([place]) 04/01/2014  . Peripheral neuropathy 04/01/2014  . Bursitis of hip 12/29/2013  . Encounter to establish care 10/13/2013  . HLD (hyperlipidemia) 10/13/2013    Past Surgical History:  Procedure Laterality Date  . BREAST BIOPSY Left    benign  . Cataract Surgery Bilateral   . COLONOSCOPY WITH PROPOFOL N/A 04/18/2017   Procedure: COLONOSCOPY WITH PROPOFOL;  Surgeon: Robert Bellow, MD;  Location: [facility] ENDOSCOPY;  Service: Endoscopy;  Laterality: N/A;  . INGUINAL HERNIA REPAIR Left   . ROTATOR CUFF REPAIR Left     Prior to Admission medications   Medication Sig Start Date End Date Taking? Authorizing Provider  alendronate (FOSAMAX) 70 MG tablet TAKE 1 TABLET ONCE A WEEK WITH A FULL GLASS OF WATER ON AN EMPTY STOMACH 10/23/18   Crecencio Mc, MD  aspirin EC 81 MG tablet Take 81 mg by mouth daily.    [provider]  atorvastatin (LIPITOR) 20 MG tablet TAKE 1 TABLET EVERY DAY 06/10/18   Crecencio Mc, MD  Calcium Citrate-Vitamin D (CITRACAL + D PO) Take 650 mg by mouth daily. 2 tablets daily    [provider]  Cholecalciferol (VITAMIN D3) 10000 UNITS TABS Take 1 tablet by mouth daily.    [provider]  diclofenac (VOLTAREN) 75 MG EC tablet Take 1 tablet (75 mg total) by mouth 2 (two) times daily. 06/07/18   Crecencio Mc, MD  docusate sodium (STOOL SOFTENER) 100 MG capsule Take 100 mg by mouth 2 (two) times daily.    [provider]  famotidine (PEPCID) 40 MG tablet TAKE 1 TABLET EVERY DAY  09/16/18   Crecencio Mc, MD  fluticasone [facility]) 50 MCG/ACT nasal spray USE 2 SPRAYS IN EACH NOSTRIL EVERY DAY 09/16/18   Crecencio Mc, MD  meclizine (ANTIVERT) 12.5 MG tablet TAKE 1 TABLET THREE TIMES DAILY AS NEEDED FOR DIZZINESS 09/27/18   Crecencio Mc, MD  oseltamivir (TAMIFLU) 75 MG capsule Take 1 capsule (75 mg total) by mouth 2 (two) times daily for 5 days. 11/28/18 12/03/18  Hinda Kehr, MD    Allergies Patient has no known allergies.  Family History  Problem Relation Age of Onset  . Alcohol abuse Mother   . Arthritis Mother   . Hypertension Mother   . Cancer Father 54       Throat Cancer - died  . Alcohol abuse Father   . Cancer Sister 58       Breast Cancer  . Alcohol abuse Sister     Social History Social History   Tobacco Use  . Smoking status: Former Smoker    Packs/day: 2.00    Years: 21.00    Pack years: 42.00    Types: Cigarettes  . Smokeless tobacco: Never Used  Substance Use Topics  . Alcohol use: Yes    Comment: Occasional wine glass  . Drug use: No    Review of Systems Constitutional: Subjective fever Eyes: No visual changes. ENT: Nasal congestion.  No sore throat. Cardiovascular: Denies chest pain. Respiratory: Nonproductive cough.  Denies shortness of breath. Gastrointestinal: No abdominal pain.  No nausea, no vomiting.  No diarrhea.  No constipation. Genitourinary: Negative for dysuria. Musculoskeletal: Negative for neck pain.  Negative for back pain. Integumentary: Negative for rash. Neurological: Negative for headaches, focal weakness or numbness.   ____________________________________________   PHYSICAL EXAM:  VITAL SIGNS: ED Triage Vitals  Enc Vitals Group     BP 11/28/18 0548 (!) 150/82     Pulse Rate 11/28/18 0545 67     Resp 11/28/18 0548 16     Temp 11/28/18 0548 98.9 F (37.2 C)     Temp Source 11/28/18 0548 Oral     SpO2 11/28/18 0545 94 %     Weight 11/28/18 0541 90.7 kg (200 lb)     Height 11/28/18 0541  1.727 m (5\' 8" )     Head Circumference --      Peak Flow --      Pain Score 11/28/18 0540 0     Pain Loc --      Pain Edu? --      Excl. in [place]? --     Constitutional: Alert and oriented. Well appearing and in no acute distress. Eyes: Conjunctivae are normal.  Head: Atraumatic. Nose: Mild congestion/rhinnorhea. Mouth/Throat: Mucous membranes are moist.  Oropharynx non-erythematous. Neck: No stridor.  No meningeal signs.   Cardiovascular: Normal rate, regular rhythm. Good peripheral circulation. Grossly normal heart sounds. Respiratory: Normal respiratory effort.  No retractions. Lungs CTAB.  Cough with deep inspiration. Gastrointestinal: Soft and nontender. No distention.  Musculoskeletal: No lower extremity tenderness nor edema. No gross deformities of extremities. Neurologic:  Normal speech and language. No gross focal neurologic deficits are appreciated.  Skin:  Skin is warm, dry and intact. No rash noted. Psychiatric: Mood and affect are normal. Speech and behavior are normal.  ____________________________________________   LABS (all labs ordered are listed, but only abnormal results are displayed)  Labs Reviewed  INFLUENZA PANEL BY PCR (TYPE A & B) - Abnormal; Notable for the following components:      Result Value   Influenza A By PCR POSITIVE (*)    All other components within normal limits   ____________________________________________  EKG  No indication for EKG ____________________________________________  RADIOLOGY   ED MD interpretation: No indication for imaging  Official radiology report(s): No results found.  ____________________________________________   PROCEDURES  Critical Care performed: No   Procedure(s) performed:   Procedures   ____________________________________________   INITIAL IMPRESSION / ASSESSMENT AND PLAN / ED COURSE  As part of my medical decision making, I reviewed the following data within the electronic medical  record:  Nursing notes reviewed and incorporated, Labs reviewed  and Notes from prior ED visits    Differential diagnosis includes, but is not limited to, influenza, nonspecific influenza-like viral illness, pneumonia.  As per the patient's request I will check an influenza swab.  Given the short duration of symptoms, no shortness of breath, no hypoxia, and clear lung sounds, I do not think it is necessary to get a chest x-ray at this time and the patient agrees.  No indication for blood work.  He is hemodynamically stable and in no distress in spite of the obvious viral symptoms.  Clinical Course as of Nov 28 717  Thu Nov 28, 2018  0638 Vital signs stable, no tachycardia, no hypoxemia, no fever   [CF]  0707 Patient tested positive for influenza A.  He continues to be in no acute distress.  I had my usual customary influenza discussion with the patient and his wife.  He prefers treatment so I am writing him a prescription for Tamiflu.  I gave strict return precautions.  Influenza A By PCR(!): POSITIVE [CF]    Clinical Course User Index [CF] Hinda Kehr, MD    ____________________________________________  FINAL CLINICAL IMPRESSION(S) / ED DIAGNOSES  Final diagnoses:  Influenza A     MEDICATIONS GIVEN DURING THIS VISIT:  Medications - No data to display   ED Discharge Orders         Ordered    oseltamivir (TAMIFLU) 75 MG capsule  2 times daily     11/28/18 0709           Note:  This document was prepared using Dragon voice recognition software and may include unintentional dictation errors.    Hinda Kehr, MD 11/28/18 [phone]

## 2018-11-28 NOTE — Discharge Instructions (Signed)
You tested positive for influenza A.  Please fill the prescription for Tamiflu and take it according to instructions.  Please read through the included information about influenza, try to stay hydrated with plenty of fluids, take over-the-counter acetaminophen and/or ibuprofen as needed for fever and pain, and follow-up with your regular doctor.  Return to the emergency department if you develop new or worsening symptoms that concern you.

## 2018-12-06 ENCOUNTER — Ambulatory Visit (INDEPENDENT_AMBULATORY_CARE_PROVIDER_SITE_OTHER): Payer: Medicare Other | Admitting: Internal Medicine

## 2018-12-06 ENCOUNTER — Encounter: Payer: Self-pay | Admitting: Internal Medicine

## 2018-12-06 ENCOUNTER — Ambulatory Visit (INDEPENDENT_AMBULATORY_CARE_PROVIDER_SITE_OTHER): Payer: Medicare Other

## 2018-12-06 VITALS — BP 110/76 | HR 65 | Temp 98.8°F | Resp 16 | Ht 68.0 in | Wt 203.4 lb

## 2018-12-06 DIAGNOSIS — R062 Wheezing: Secondary | ICD-10-CM | POA: Diagnosis not present

## 2018-12-06 DIAGNOSIS — J101 Influenza due to other identified influenza virus with other respiratory manifestations: Secondary | ICD-10-CM

## 2018-12-06 DIAGNOSIS — R05 Cough: Secondary | ICD-10-CM | POA: Diagnosis not present

## 2018-12-06 MED ORDER — PREDNISONE 10 MG PO TABS: ORAL_TABLET | ORAL | 0 refills | Status: DC

## 2018-12-06 MED ORDER — BENZONATATE 200 MG PO CAPS: 200.0000 mg | ORAL_CAPSULE | Freq: Three times a day (TID) | ORAL | 1 refills | Status: DC | PRN

## 2018-12-06 NOTE — Patient Instructions (Signed)
For your back pain:    Tylenol take (acetominopehn)  1000 mg at bedtime.  You  can repeat this dose once more  during the morning or day ( 2000 mg daily dose max)   Once you are done with prednisone,   you  Can resume diclofenac twice daily or just at bedtime   You are no longer contagious.    Cough suppression is key ,  Use the capsules I prescribed every 8 hours 6 tablets all at once on Day 1,  Then taper by 1 tablet daily until gone  If your chest  Xray shows an infiltrate ,  I will add an antibiotic

## 2018-12-06 NOTE — Progress Notes (Signed)
Subjective:  Patient ID: Jamie Zhang, male    DOB: 08-05-1941  Age: 78 y.o. MRN: [id number]  CC: The primary encounter diagnosis was Wheezing. A diagnosis of Influenza A was also pertinent to this visit.  HPI Jamie Zhang presents for ER follow up on Jan 16 for cough, congestion malaise and subj fevers of 2 days duration..  Diagnosed with influenza A  And prescribed  tamiflu , BUT DID NOT TAKE THE TAMIFLU due to concern for side effects.  He feels better but continues to have a non productive cough and occasional wheezing. .     Outpatient Medications Prior to Visit  Medication Sig Dispense Refill  . alendronate (FOSAMAX) 70 MG tablet TAKE 1 TABLET ONCE A WEEK WITH A FULL GLASS OF WATER ON AN EMPTY STOMACH 12 tablet 0  . aspirin EC 81 MG tablet Take 81 mg by mouth daily.    [name] atorvastatin (LIPITOR) 20 MG tablet TAKE 1 TABLET EVERY DAY 90 tablet 1  . Calcium Citrate-Vitamin D (CITRACAL + D PO) Take 650 mg by mouth daily. 2 tablets daily    . Cholecalciferol (VITAMIN D3) 10000 UNITS TABS Take 1 tablet by mouth daily.    . diclofenac (VOLTAREN) 75 MG EC tablet Take 1 tablet (75 mg total) by mouth 2 (two) times daily. 60 tablet 2  . docusate sodium (STOOL SOFTENER) 100 MG capsule Take 100 mg by mouth 2 (two) times daily.    . famotidine (PEPCID) 40 MG tablet TAKE 1 TABLET EVERY DAY 90 tablet 1  . fluticasone (FLONASE) 50 MCG/ACT nasal spray USE 2 SPRAYS IN EACH NOSTRIL EVERY DAY 48 g 2  . meclizine (ANTIVERT) 12.5 MG tablet TAKE 1 TABLET THREE TIMES DAILY AS NEEDED FOR DIZZINESS 90 tablet 0   No facility-administered medications prior to visit.     Review of Systems;  Patient denies headache, fevers, malaise, unintentional weight loss, skin rash, eye pain, sinus congestion and sinus pain, sore throat, dysphagia,  hemoptysis ,dyspnea, wheezing, chest pain, palpitations, orthopnea, edema, abdominal pain, nausea, melena, diarrhea, constipation, flank pain, dysuria, hematuria, urinary   Frequency, nocturia, numbness, tingling, seizures,  Focal weakness, Loss of consciousness,  Tremor, insomnia, depression, anxiety, and suicidal ideation.      Objective:  BP 110/76 (BP Location: Left Arm, Patient Position: Sitting, Cuff Size: Normal)   Pulse 65   Temp 98.8 F (37.1 C) (Oral)   Resp 16   Ht 5\' 8"  (1.727 m)   Wt 203 lb 6.4 oz (92.3 kg)   SpO2 94%   BMI 30.93 kg/m   BP Readings from Last 3 Encounters:  12/06/18 110/76  11/28/18 132/78  11/22/18 114/74    Wt Readings from Last 3 Encounters:  12/06/18 203 lb 6.4 oz (92.3 kg)  11/28/18 200 lb (90.7 kg)  11/22/18 210 lb (95.3 kg)    General appearance: alert, cooperative and appears stated age Ears: normal TM's and external ear canals both ears Throat: lips, mucosa, and tongue normal; teeth and gums normal Neck: no adenopathy, no carotid bruit, supple, symmetrical, trachea midline and thyroid not enlarged, symmetric, no tenderness/mass/nodules Back: symmetric, no curvature. ROM normal. No CVA tenderness. Lungs: clear to auscultation bilaterally Heart: regular rate and rhythm, S1, S2 normal, no murmur, click, rub or gallop Abdomen: soft, non-tender; bowel sounds normal; no masses,  no organomegaly Pulses: 2+ and symmetric Skin: Skin color, texture, turgor normal. No rashes or lesions Lymph nodes: Cervical, supraclavicular, and axillary nodes normal.  Lab Results  Component Value Date   HGBA1C 5.4 08/19/2018   HGBA1C 5.8 02/15/2018   HGBA1C 5.7 08/31/2017    Lab Results  Component Value Date   CREATININE 0.9 07/10/2018   CREATININE 0.93 02/15/2018   CREATININE 0.99 08/31/2017    Lab Results  Component Value Date   WBC 8.9 07/10/2018   HGB 15.9 07/10/2018   HCT 47 07/10/2018   PLT 177 07/10/2018   GLUCOSE 96 02/15/2018   CHOL 120 02/15/2018   TRIG 74.0 02/15/2018   HDL 55.80 02/15/2018   LDLDIRECT 47.0 12/24/2015   LDLCALC 50 02/15/2018   ALT 16 07/10/2018   AST 16 07/10/2018   NA 140  07/10/2018   K 3.9 07/10/2018   CL 106 02/15/2018   CREATININE 0.9 07/10/2018   BUN 17 07/10/2018   CO2 28 02/15/2018   TSH 3.21 07/10/2018   PSA 0.86 11/03/2015   HGBA1C 5.4 08/19/2018    No results found.  Assessment & Plan:   Problem List Items Addressed This Visit    Influenza A    diagnosed on jan 16 by ER physician.  He has recovered with supportive care,  Without tamiflu but has a persistent cough..  Chest x ray  Is clear today.  .  Prednisone and tessalon to managewheezing and  persistent cough.       Relevant Orders   DG Chest 2 View (Completed)    Other Visit Diagnoses    Wheezing    -  Primary   Relevant Orders   DG Chest 2 View (Completed)      I am having Jamie Zhang start on benzonatate and predniSONE. I am also having him maintain his Calcium Citrate-Vitamin D (CITRACAL + D PO), Vitamin D3, aspirin EC, docusate sodium, diclofenac, atorvastatin, famotidine, fluticasone, meclizine, and alendronate.  Meds ordered this encounter  Medications  . benzonatate (TESSALON) 200 MG capsule    Sig: Take 1 capsule (200 mg total) by mouth 3 (three) times daily as needed for cough.    Dispense:  60 capsule    Refill:  1  . predniSONE (DELTASONE) 10 MG tablet    Sig: 6 tablets on Day 1 , then reduce by 1 tablet daily until gone    Dispense:  21 tablet    Refill:  0    PLEASE DO NOT USE THE PILL PACK    There are no discontinued medications.  Follow-up: No follow-ups on file.   Crecencio Mc, MD

## 2018-12-08 ENCOUNTER — Encounter: Payer: Self-pay | Admitting: Internal Medicine

## 2018-12-08 DIAGNOSIS — J101 Influenza due to other identified influenza virus with other respiratory manifestations: Secondary | ICD-10-CM | POA: Insufficient documentation

## 2018-12-08 NOTE — Assessment & Plan Note (Addendum)
diagnosed on jan 16 by ER physician.  He has recovered with supportive care,  Without tamiflu but has a persistent cough..  Chest x ray  Is clear today.  .  Prednisone and tessalon to managewheezing and  persistent cough.

## 2018-12-09 MED ORDER — ATORVASTATIN CALCIUM 20 MG PO TABS: 20.0000 mg | ORAL_TABLET | Freq: Every day | ORAL | 1 refills | Status: DC

## 2018-12-30 MED ORDER — ATORVASTATIN CALCIUM 20 MG PO TABS: 20.0000 mg | ORAL_TABLET | Freq: Every day | ORAL | 1 refills | Status: DC

## 2019-01-20 MED ORDER — ALENDRONATE SODIUM 70 MG PO TABS: ORAL_TABLET | ORAL | 0 refills | Status: DC

## 2019-01-24 ENCOUNTER — Encounter: Payer: Self-pay | Admitting: Internal Medicine

## 2019-01-24 ENCOUNTER — Ambulatory Visit (INDEPENDENT_AMBULATORY_CARE_PROVIDER_SITE_OTHER): Payer: Medicare Other | Admitting: Internal Medicine

## 2019-01-24 ENCOUNTER — Other Ambulatory Visit: Payer: Self-pay

## 2019-01-24 VITALS — BP 118/66 | HR 49 | Temp 97.5°F | Resp 15 | Ht 68.0 in | Wt 208.6 lb

## 2019-01-24 DIAGNOSIS — G4733 Obstructive sleep apnea (adult) (pediatric): Secondary | ICD-10-CM

## 2019-01-24 DIAGNOSIS — R7303 Prediabetes: Secondary | ICD-10-CM | POA: Diagnosis not present

## 2019-01-24 DIAGNOSIS — I714 Abdominal aortic aneurysm, without rupture, unspecified: Secondary | ICD-10-CM

## 2019-01-24 DIAGNOSIS — E781 Pure hyperglyceridemia: Secondary | ICD-10-CM

## 2019-01-24 DIAGNOSIS — E785 Hyperlipidemia, unspecified: Secondary | ICD-10-CM

## 2019-01-24 LAB — LIPID PANEL
Cholesterol: 119 mg/dL (ref 0–200)
HDL: 56.3 mg/dL (ref >39.00)
LDL CALC: 49 mg/dL (ref 0–99)
NonHDL: 62.68
Total CHOL/HDL Ratio: 2
Triglycerides: 66 mg/dL (ref 0.0–149.0)
VLDL: 13.2 mg/dL (ref 0.0–40.0)

## 2019-01-24 LAB — COMPREHENSIVE METABOLIC PANEL
ALT: 47 U/L (ref 0–53)
AST: 33 U/L (ref 0–37)
Albumin: 4.5 g/dL (ref 3.5–5.2)
Alkaline Phosphatase: 78 U/L (ref 39–117)
BUN: 25 mg/dL — ABNORMAL HIGH (ref 6–23)
CO2: 29 mEq/L (ref 19–32)
Calcium: 9.4 mg/dL (ref 8.4–10.5)
Chloride: 106 mEq/L (ref 96–112)
Creatinine, Ser: 0.95 mg/dL (ref 0.40–1.50)
GFR: 76.52 mL/min (ref >60.00)
Glucose, Bld: 91 mg/dL (ref 70–99)
POTASSIUM: 4.7 meq/L (ref 3.5–5.1)
Sodium: 141 mEq/L (ref 135–145)
Total Bilirubin: 1.7 mg/dL — ABNORMAL HIGH (ref 0.2–1.2)
Total Protein: 6.5 g/dL (ref 6.0–8.3)

## 2019-01-24 LAB — HEMOGLOBIN A1C: Hgb A1c MFr Bld: 5.8 % (ref 4.6–6.5)

## 2019-01-24 NOTE — Patient Instructions (Signed)
No medication  Changes today  Continue alendronate for now

## 2019-01-24 NOTE — Progress Notes (Signed)
Subjective:  Patient ID: Jamie Zhang, male    DOB: 1941/03/31  Age: 78 y.o. MRN: [id number]  CC: The primary encounter diagnosis was Prediabetes. Diagnoses of Pure hypertriglyceridemia, Hyperlipidemia LDL goal <130, OSA (obstructive sleep apnea), and Abdominal aortic aneurysm (AAA) without rupture ([place]) were also pertinent to this visit.  HPI Jamie Zhang presents for follow up on multiple issues:  1) recent illness secondary to influenza type a.  He states that he feels generally well.  He has returned to  Exercising daily on the treadmill  for 30 minutes.   2) Osteopenia by recent DXA. Taking fosomax .  Has been on it for over  5years   3) OSA on CPAP . Has resumed wearing  CPAP since obtaining a different sized mask and notes improved daytime wakefulness and nighttime quality of sleep      Outpatient Medications Prior to Visit  Medication Sig Dispense Refill  . alendronate (FOSAMAX) 70 MG tablet TAKE 1 TABLET ONCE A WEEK WITH A FULL GLASS OF WATER ON AN EMPTY STOMACH 12 tablet 0  . aspirin EC 81 MG tablet Take 81 mg by mouth daily.    [name] atorvastatin (LIPITOR) 20 MG tablet Take 1 tablet (20 mg total) by mouth daily. 90 tablet 1  . Calcium Citrate-Vitamin D (CITRACAL + D PO) Take 650 mg by mouth daily. 2 tablets daily    . Cholecalciferol (VITAMIN D3) 10000 UNITS TABS Take 1 tablet by mouth daily.    . diclofenac (VOLTAREN) 75 MG EC tablet Take 1 tablet (75 mg total) by mouth 2 (two) times daily. 60 tablet 2  . docusate sodium (STOOL SOFTENER) 100 MG capsule Take 100 mg by mouth 2 (two) times daily.    . famotidine (PEPCID) 40 MG tablet TAKE 1 TABLET EVERY DAY 90 tablet 1  . fluticasone (FLONASE) 50 MCG/ACT nasal spray USE 2 SPRAYS IN EACH NOSTRIL EVERY DAY 48 g 2  . meclizine (ANTIVERT) 12.5 MG tablet TAKE 1 TABLET THREE TIMES DAILY AS NEEDED FOR DIZZINESS 90 tablet 0  . benzonatate (TESSALON) 200 MG capsule Take 1 capsule (200 mg total) by mouth 3 (three) times daily as needed for  cough. 60 capsule 1  . predniSONE (DELTASONE) 10 MG tablet 6 tablets on Day 1 , then reduce by 1 tablet daily until gone 21 tablet 0   No facility-administered medications prior to visit.     Review of Systems;  Patient denies headache, fevers, malaise, unintentional weight loss, skin rash, eye pain, sinus congestion and sinus pain, sore throat, dysphagia,  hemoptysis , cough, dyspnea, wheezing, chest pain, palpitations, orthopnea, edema, abdominal pain, nausea, melena, diarrhea, constipation, flank pain, dysuria, hematuria, urinary  Frequency, nocturia, numbness, tingling, seizures,  Focal weakness, Loss of consciousness,  Tremor, insomnia, depression, anxiety, and suicidal ideation.      Objective:  BP 118/66 (BP Location: Left Arm, Patient Position: Sitting, Cuff Size: Normal)   Pulse (!) 49   Temp (!) 97.5 F (36.4 C) (Oral)   Resp 15   Ht 5\' 8"  (1.727 m)   Wt 208 lb 9.6 oz (94.6 kg)   SpO2 97%   BMI 31.72 kg/m   BP Readings from Last 3 Encounters:  01/24/19 118/66  12/06/18 110/76  11/28/18 132/78    Wt Readings from Last 3 Encounters:  01/24/19 208 lb 9.6 oz (94.6 kg)  12/06/18 203 lb 6.4 oz (92.3 kg)  11/28/18 200 lb (90.7 kg)    General appearance: alert, cooperative and  appears stated age Ears: normal TM's and external ear canals both ears Throat: lips, mucosa, and tongue normal; teeth and gums normal Neck: no adenopathy, no carotid bruit, supple, symmetrical, trachea midline and thyroid not enlarged, symmetric, no tenderness/mass/nodules Back: symmetric, no curvature. ROM normal. No CVA tenderness. Lungs: clear to auscultation bilaterally Heart: regular rate and rhythm, S1, S2 normal, no murmur, click, rub or gallop Abdomen: soft, non-tender; bowel sounds normal; no masses,  no organomegaly Pulses: 2+ and symmetric Skin: Skin color, texture, turgor normal. No rashes or lesions Lymph nodes: Cervical, supraclavicular, and axillary nodes normal.  Lab Results   Component Value Date   HGBA1C 5.8 01/24/2019   HGBA1C 5.4 08/19/2018   HGBA1C 5.8 02/15/2018    Lab Results  Component Value Date   CREATININE 0.95 01/24/2019   CREATININE 0.9 07/10/2018   CREATININE 0.93 02/15/2018    Lab Results  Component Value Date   WBC 8.9 07/10/2018   HGB 15.9 07/10/2018   HCT 47 07/10/2018   PLT 177 07/10/2018   GLUCOSE 91 01/24/2019   CHOL 119 01/24/2019   TRIG 66.0 01/24/2019   HDL 56.30 01/24/2019   LDLDIRECT 47.0 12/24/2015   LDLCALC 49 01/24/2019   ALT 47 01/24/2019   AST 33 01/24/2019   NA 141 01/24/2019   K 4.7 01/24/2019   CL 106 01/24/2019   CREATININE 0.95 01/24/2019   BUN 25 (H) 01/24/2019   CO2 29 01/24/2019   TSH 3.21 07/10/2018   PSA 0.86 11/03/2015   HGBA1C 5.8 01/24/2019    No results found.  Assessment & Plan:   Problem List Items Addressed This Visit    Prediabetes - Primary    Elevated again to 5.8.  weight loss encouraged.  Will repeat every 6 months   Lab Results  Component Value Date   HGBA1C 5.8 01/24/2019         Relevant Orders   Comprehensive metabolic panel (Completed)   Hemoglobin A1c (Completed)   OSA (obstructive sleep apnea)    Severe,  With recent initiation of CPAP.  Control is improving with change in ill fitting mask .  pulmonology managing      HLD (hyperlipidemia)    Managed with high potency statin after  CT findings of atherosclerosis noted ulcerated placques.   Lab Results  Component Value Date   CHOL 119 01/24/2019   HDL 56.30 01/24/2019   LDLCALC 49 01/24/2019   LDLDIRECT 47.0 12/24/2015   TRIG 66.0 01/24/2019   CHOLHDL 2 01/24/2019   Lab Results  Component Value Date   ALT 47 01/24/2019   AST 33 01/24/2019   ALKPHOS 78 01/24/2019   BILITOT 1.7 (H) 01/24/2019         Abdominal aortic aneurysm ([facility])    Referred to [facility] for 4 cm AA noted on CT scan seen  initially in 2015. Annual follow up in nov 2019 noted continued gradual enlargement despite control of blood   Pressure .  His BP is well controlled and he has abstained  from tobacco.  He has 6 month  follow up May 2020       Other Visit Diagnoses    Hyperlipidemia LDL goal <130       Relevant Orders   Lipid panel (Completed)     A total of 25 minutes of face to face time was spent with patient more than half of which was spent in counselling about the above mentioned conditions  and coordination of care   I have  discontinued Vonda Antigua. Vermeer's benzonatate and predniSONE. I am also having him maintain his Calcium Citrate-Vitamin D (CITRACAL + D PO), Vitamin D3, aspirin EC, docusate sodium, diclofenac, famotidine, fluticasone, meclizine, atorvastatin, and alendronate.  No orders of the defined types were placed in this encounter.   Medications Discontinued During This Encounter  Medication Reason  . benzonatate (TESSALON) 200 MG capsule Completed Course  . predniSONE (DELTASONE) 10 MG tablet Completed Course    Follow-up: No follow-ups on file.   Crecencio Mc, MD

## 2019-01-26 NOTE — Assessment & Plan Note (Signed)
Elevated again to 5.8.  weight loss encouraged.  Will repeat every 6 months   Lab Results  Component Value Date   HGBA1C 5.8 01/24/2019

## 2019-01-26 NOTE — Assessment & Plan Note (Signed)
Severe,  With recent initiation of CPAP.  Control is improving with change in ill fitting mask .  pulmonology managing

## 2019-01-26 NOTE — Assessment & Plan Note (Addendum)
Referred to [facility] for 4 cm AA noted on CT scan seen  initially in 2015. Annual follow up in nov 2019 noted continued gradual enlargement despite control of blood  Pressure .  His BP is well controlled and he has abstained  from tobacco.  He has 6 month  follow up May 2020

## 2019-01-26 NOTE — Assessment & Plan Note (Signed)
Managed with high potency statin after  CT findings of atherosclerosis noted ulcerated placques.   Lab Results  Component Value Date   CHOL 119 01/24/2019   HDL 56.30 01/24/2019   LDLCALC 49 01/24/2019   LDLDIRECT 47.0 12/24/2015   TRIG 66.0 01/24/2019   CHOLHDL 2 01/24/2019   Lab Results  Component Value Date   ALT 47 01/24/2019   AST 33 01/24/2019   ALKPHOS 78 01/24/2019   BILITOT 1.7 (H) 01/24/2019

## 2019-02-13 ENCOUNTER — Telehealth: Payer: Self-pay | Admitting: Internal Medicine

## 2019-02-13 DIAGNOSIS — G4733 Obstructive sleep apnea (adult) (pediatric): Secondary | ICD-10-CM

## 2019-02-13 NOTE — Telephone Encounter (Signed)
Lm to request that 02/24/19 appt be moved to week of 4/6.  Also need to change OV to telephone visit or reschedule out to June if pt does not wish to due telephone visit, due to covid-19.

## 2019-02-13 NOTE — Telephone Encounter (Signed)
Pt returned call about appt. Stated that he does not necessarily need to be seen or do a phone visit and is ok with rescheduling for June. Did have concerns about CPAP machine, stated that he thinks the pressure is too high and would like to have it reduced.

## 2019-02-13 NOTE — Telephone Encounter (Signed)
Called and spoke to pt. Pt feels that current cpap pressure of 15-25cm is too strong. Pt is requesting that pressure be reduced to 12-15. Download has been printed and placed in DR's folder for review.  DR please advise. Thanks

## 2019-02-14 NOTE — Telephone Encounter (Signed)
Pt is aware of below recommendations and voiced his understanding. Order has been placed to [facility] to decrease cpap pressure. Nothing further is needed.

## 2019-02-14 NOTE — Telephone Encounter (Signed)
Her current pressure range is set at 15-20, during the time the machine stays around a pressure of 19, and even with that her AHI is mildly elevated at 10.  If she feels that she can not tolerate the pressure, could change it to 12-18 cm H2O.

## 2019-02-24 ENCOUNTER — Ambulatory Visit: Payer: Medicare Other | Admitting: Internal Medicine

## 2019-03-11 MED ORDER — FAMOTIDINE 40 MG PO TABS: 40.0000 mg | ORAL_TABLET | Freq: Every day | ORAL | 1 refills | Status: DC

## 2019-04-11 ENCOUNTER — Telehealth: Payer: Self-pay | Admitting: Internal Medicine

## 2019-04-11 ENCOUNTER — Other Ambulatory Visit: Payer: Self-pay

## 2019-04-11 MED ORDER — ALENDRONATE SODIUM 70 MG PO TABS: ORAL_TABLET | ORAL | 0 refills | Status: DC

## 2019-04-11 NOTE — Telephone Encounter (Signed)
CPAP [facility] Apr 11 2019

## 2019-04-14 NOTE — Telephone Encounter (Signed)
Order has been faxed

## 2019-04-16 ENCOUNTER — Ambulatory Visit (INDEPENDENT_AMBULATORY_CARE_PROVIDER_SITE_OTHER): Payer: Medicare Other | Admitting: Nurse Practitioner

## 2019-04-16 ENCOUNTER — Other Ambulatory Visit (INDEPENDENT_AMBULATORY_CARE_PROVIDER_SITE_OTHER): Payer: Medicare Other

## 2019-04-22 ENCOUNTER — Ambulatory Visit (INDEPENDENT_AMBULATORY_CARE_PROVIDER_SITE_OTHER): Payer: Medicare Other | Admitting: Vascular Surgery

## 2019-04-22 ENCOUNTER — Other Ambulatory Visit: Payer: Self-pay

## 2019-04-22 ENCOUNTER — Ambulatory Visit (INDEPENDENT_AMBULATORY_CARE_PROVIDER_SITE_OTHER): Payer: Medicare Other

## 2019-04-22 ENCOUNTER — Encounter (INDEPENDENT_AMBULATORY_CARE_PROVIDER_SITE_OTHER): Payer: Self-pay | Admitting: Vascular Surgery

## 2019-04-22 VITALS — BP 131/81 | HR 50 | Resp 16 | Ht 68.75 in | Wt 206.4 lb

## 2019-04-22 DIAGNOSIS — I714 Abdominal aortic aneurysm, without rupture, unspecified: Secondary | ICD-10-CM

## 2019-04-22 DIAGNOSIS — E781 Pure hyperglyceridemia: Secondary | ICD-10-CM

## 2019-04-22 DIAGNOSIS — Z79899 Other long term (current) drug therapy: Secondary | ICD-10-CM

## 2019-04-22 NOTE — Progress Notes (Signed)
MRN : [id number]  Jamie Zhang is a 78 y.o. (02-04-1941) male who presents with chief complaint of  Chief Complaint  Patient presents with  . Follow-up    46month AAA ultrasound  .  History of Present Illness: Patient returns today in follow up of his abdominal aortic aneurysm.  He is doing well without complaints today.  He denies any aneurysm related symptoms. Specifically, the patient denies new back or abdominal pain, or signs of peripheral embolization. Duplex today shows no change in the abdominal aortic aneurysm measuring approximately 4.0 cm in maximal diameter.   Current Outpatient Medications  Medication Sig Dispense Refill  . alendronate (FOSAMAX) 70 MG tablet TAKE 1 TABLET ONCE A WEEK WITH A FULL GLASS OF WATER ON AN EMPTY STOMACH 12 tablet 0  . aspirin EC 81 MG tablet Take 81 mg by mouth daily.    [name] atorvastatin (LIPITOR) 20 MG tablet Take 1 tablet (20 mg total) by mouth daily. 90 tablet 1  . Calcium Citrate-Vitamin D (CITRACAL + D PO) Take 650 mg by mouth daily. 2 tablets daily    . Cholecalciferol (VITAMIN D3) 10000 UNITS TABS Take 1 tablet by mouth daily.    . diclofenac (VOLTAREN) 75 MG EC tablet Take 1 tablet (75 mg total) by mouth 2 (two) times daily. 60 tablet 2  . docusate sodium (STOOL SOFTENER) 100 MG capsule Take 100 mg by mouth 2 (two) times daily.    . famotidine (PEPCID) 40 MG tablet Take 1 tablet (40 mg total) by mouth daily. 90 tablet 1  . fluticasone (FLONASE) 50 MCG/ACT nasal spray USE 2 SPRAYS IN EACH NOSTRIL EVERY DAY 48 g 2  . meclizine (ANTIVERT) 12.5 MG tablet TAKE 1 TABLET THREE TIMES DAILY AS NEEDED FOR DIZZINESS 90 tablet 0   No current facility-administered medications for this visit.     Past Medical History:  Diagnosis Date  . Arthritis   . Cataract 01/02/2011   Bilateral - s/p excision  . Colon polyps 2000   Hyperplastic - Repeat colonoscopy 2015  . GERD (gastroesophageal reflux disease)    Pantoprazole  . Hemorrhoids 2006  . HLD  (hyperlipidemia)   . Iron deficiency anemia 03/20/2006   Donating too much blood  . Migraine    No longer has problems  . Osteopenia    on Fosamax  . RLS (restless legs syndrome) 03/20/2006   2/2 iron deficiency  . Schatzki's ring   . Skin cancer 01/04/2012   Basal cell nose, squamous cell scalp - Dr. Ula Lingo    Past Surgical History:  Procedure Laterality Date  . BREAST BIOPSY Left    benign  . Cataract Surgery Bilateral   . COLONOSCOPY WITH PROPOFOL N/A 04/18/2017   Procedure: COLONOSCOPY WITH PROPOFOL;  Surgeon: Robert Bellow, MD;  Location: [facility] ENDOSCOPY;  Service: Endoscopy;  Laterality: N/A;  . INGUINAL HERNIA REPAIR Left   . ROTATOR CUFF REPAIR Left     Social History Social History   Tobacco Use  . Smoking status: Former Smoker    Packs/day: 2.00    Years: 21.00    Pack years: 42.00    Types: Cigarettes  . Smokeless tobacco: Never Used  Substance Use Topics  . Alcohol use: Yes    Comment: Occasional wine glass  . Drug use: No    Family History Family History  Problem Relation Age of Onset  . Alcohol abuse Mother   . Arthritis Mother   . Hypertension Mother   .  Cancer Father 59       Throat Cancer - died  . Alcohol abuse Father   . Cancer Sister 72       Breast Cancer  . Alcohol abuse Sister     No Known Allergies  REVIEW OF SYSTEMS(Negative unless checked)  Constitutional: [] ?Weight loss[] ?Fever[] ?Chills Cardiac:[] ?Chest pain[] ?Chest pressure[] ?Palpitations [] ?Shortness of breath when laying flat [] ?Shortness of breath at rest [] ?Shortness of breath with exertion. Vascular: [] ?Pain in legs with walking[] ?Pain in legsat rest[] ?Pain in legs when laying flat [] ?Claudication [] ?Pain in feet when walking [] ?Pain in feet at rest [] ?Pain in feet when laying flat [] ?History of DVT [] ?Phlebitis [] ?Swelling in legs [] ?Varicose veins [] ?Non-healing ulcers Pulmonary: [] ?Uses home oxygen [] ?Productive  cough[] ?Hemoptysis [] ?Wheeze [] ?COPD [] ?Asthma Neurologic: [] ?Dizziness [] ?Blackouts [] ?Seizures [] ?History of stroke [] ?History of TIA[] ?Aphasia [] ?Temporary blindness[] ?Dysphagia [] ?Weaknessor numbness in arms [] ?Weakness or numbnessin legs Musculoskeletal: [x] ?Arthritis [] ?Joint swelling [] ?Joint pain [x] ?Low back pain Hematologic:[] ?Easy bruising[] ?Easy bleeding [] ?Hypercoagulable state [] ?Anemic  Gastrointestinal:[] ?Blood in stool[] ?Vomiting blood[x] ?Gastroesophageal reflux/heartburn[] ?Abdominal pain Genitourinary: [] ?Chronic kidney disease [] ?Difficulturination [] ?Frequenturination [] ?Burning with urination[] ?Hematuria Skin: [] ?Rashes [] ?Ulcers [] ?Wounds Psychological: [] ?History of anxiety[] ?History of major depression.   Physical Examination  BP 131/81 (BP Location: Right Arm)   Pulse (!) 50   Resp 16   Ht 5' 8.75" (1.746 m)   Wt 206 lb 6.4 oz (93.6 kg)   BMI 30.70 kg/m  Gen:  WD/WN, NAD. Appears younger than stated age. Head: [place]/AT, No temporalis wasting. Ear/Nose/Throat: Hearing grossly intact, nares w/o erythema or drainage Eyes: Conjunctiva clear. Sclera non-icteric Neck: Supple.  Trachea midline Pulmonary:  Good air movement, no use of accessory muscles.  Cardiac: RRR, no JVD Vascular:  Vessel Right Left  Radial Palpable Palpable                                   Gastrointestinal: soft, non-tender/non-distended.  Musculoskeletal: M/S 5/5 throughout.  No deformity or atrophy. No edema. Neurologic: Sensation grossly intact in extremities.  Symmetrical.  Speech is fluent.  Psychiatric: Judgment intact, Mood & affect appropriate for pt's clinical situation. Dermatologic: No rashes or ulcers noted.  No cellulitis or open wounds.       Labs Recent Results (from the past 2160 hour(s))  Lipid panel     Status: None   Collection Time: 01/24/19  9:12 AM  Result Value Ref Range    Cholesterol 119 0 - 200 mg/dL    Comment: ATP III Classification       Desirable:  < 200 mg/dL               Borderline High:  200 - 239 mg/dL          High:  > = 240 mg/dL   Triglycerides 66.0 0.0 - 149.0 mg/dL    Comment: Normal:  <150 mg/dLBorderline High:  150 - 199 mg/dL   HDL 56.30 >39.00 mg/dL   VLDL 13.2 0.0 - 40.0 mg/dL   LDL Cholesterol 49 0 - 99 mg/dL   Total CHOL/HDL Ratio 2     Comment:                Men          Women1/2 Average Risk     3.4          3.3Average Risk          5.0          4.42X Average Risk  9.6          7.13X Average Risk          15.0          11.0                       NonHDL 62.68     Comment: NOTE:  Non-HDL goal should be 30 mg/dL higher than patient's LDL goal (i.e. LDL goal of < 70 mg/dL, would have non-HDL goal of < 100 mg/dL)  Comprehensive metabolic panel     Status: Abnormal   Collection Time: 01/24/19  9:12 AM  Result Value Ref Range   Sodium 141 135 - 145 mEq/L   Potassium 4.7 3.5 - 5.1 mEq/L   Chloride 106 96 - 112 mEq/L   CO2 29 19 - 32 mEq/L   Glucose, Bld 91 70 - 99 mg/dL   BUN 25 (H) 6 - 23 mg/dL   Creatinine, Ser 0.95 0.40 - 1.50 mg/dL   Total Bilirubin 1.7 (H) 0.2 - 1.2 mg/dL   Alkaline Phosphatase 78 39 - 117 U/L   AST 33 0 - 37 U/L   ALT 47 0 - 53 U/L   Total Protein 6.5 6.0 - 8.3 g/dL   Albumin 4.5 3.5 - 5.2 g/dL   Calcium 9.4 8.4 - 10.5 mg/dL   GFR 76.52 >60.00 mL/min  Hemoglobin A1c     Status: None   Collection Time: 01/24/19  9:12 AM  Result Value Ref Range   Hgb A1c MFr Bld 5.8 4.6 - 6.5 %    Comment: Glycemic Control Guidelines for People with Diabetes:Non Diabetic:  <6%Goal of Therapy: <7%Additional Action Suggested:  >8%     Radiology No results found.  Assessment/Plan  HLD (hyperlipidemia) lipid control important in reducing the progression of atherosclerotic disease. Continue statin therapy   Abdominal aortic aneurysm Duplex today shows no change in the abdominal aortic aneurysm measuring  approximately 4.0 cm in maximal diameter.  At this point, we will do another 58-month follow-up interval.  Blood pressure control and abstinence from tobacco are the main things to avoid aneurysm growth.  Contact our office with any problems in the interim.    Leotis Pain, MD  04/22/2019 9:12 AM    This note was created with Dragon medical transcription system.  Any errors from dictation are purely unintentional

## 2019-04-22 NOTE — Patient Instructions (Signed)
Abdominal Aortic Aneurysm  An aneurysm is a bulge in an artery. It happens when blood pushes against a weakened or damaged artery wall. An abdominal aortic aneurysm (AAA) is an aneurysm that occurs in the lower part of the aorta, which is the main artery of the body. The aorta supplies blood from the heart to the rest of the body. Some aneurysms may not cause symptoms. However, an AAA can cause two serious problems:  It can enlarge and burst (rupture).  It can cause blood to flow between the layers of the wall of the aorta through a tear (aortic dissection). Both of these problems are medical emergencies. They can cause bleeding inside the body. If they are not diagnosed and treated right away, they can be life-threatening. What are the causes? The exact cause of this condition is not known. What increases the risk? The following factors may make you more likely to develop this condition:  Being a male 60 years of age or older.  Being Caucasian.  Using tobacco or having a history of tobacco use.  Having a family history of aneurysms.  Having any of the following conditions: ? Hardening of the arteries (arteriosclerosis). ? Inflammation of the walls of an artery (arteritis). ? Certain genetic conditions. ? Obesity. ? An infection in the wall of the aorta (infectious aortitis) caused by bacteria. ? High cholesterol. ? High blood pressure (hypertension). What are the signs or symptoms? Symptoms of this condition vary depending on the size of the aneurysm and how fast it is growing. Most aneurysms grow slowly and do not cause any symptoms. When symptoms do occur, they may include:  Pain in the abdomen, side, or lower back.  Feeling full after eating only small amounts of food.  Feeling a pulsating lump in the abdomen. Symptoms that the aneurysm has ruptured include:  A sudden onset of severe pain in the abdomen, side, or back.  Nausea or vomiting.  Feeling light-headed or  passing out. How is this diagnosed? This condition may be diagnosed with:  A physical exam to check for throbbing and to listen to blood flow in your abdomen.  Tests, such as: ? Ultrasound. ? X-rays. ? CT scan. ? MRI. ? Tests to check your arteries for damage or blockage (angiogram). Because most unruptured AAAs cause no symptoms, they are often found during exams for other conditions. How is this treated? Treatment for this condition depends on:  The size of the aneurysm.  How fast the aneurysm is growing.  Your age.  Risk factors for rupture. If your aneurysm is smaller than 2 inches (5 cm), your health care provider may manage it by:  Checking (monitoring) it regularly to see if it is getting bigger. Depending on the size of the aneurysm, how fast it is growing, and your other risk factors, you may have an ultrasound to monitor it every 3-6 months, every year, or every few years.  Giving you medicines to control blood pressure, treat pain, or fight infection. If your aneurysm is larger than 2 inches (5 cm), your health care provider may do surgery to repair it. Follow these instructions at home: Lifestyle  Do not use any products that contain nicotine or tobacco, such as cigarettes, e-cigarettes, and chewing tobacco. If you need help quitting, ask your health care provider.  Stay physically active and exercise regularly. Talk with your health care provider about how often you should exercise and which types of exercise are safe for you. Eating and drinking    Eat a heart-healthy diet. This includes plenty of fresh fruits and vegetables, whole grains, low-fat (lean) protein, and low-fat dairy products.  Avoid foods that are high in saturated fat and cholesterol, such as red meat and some dairy products.  Do not drink alcohol if: ? Your health care provider tells you not to drink. ? You are pregnant, may be pregnant, or are planning to become pregnant.  If you drink  alcohol: ? Limit how much you use to:  0-1 drink a day for women.  0-2 drinks a day for men. ? Be aware of how much alcohol is in your drink. In the [place] one drink equals one typical bottle of beer (12 oz), one-half glass of wine (5 oz), or one shot of hard liquor (1 oz). General instructions  Take over-the-counter and prescription medicines only as told by your health care provider.  Keep your blood pressure within a normal range. Check it regularly, and ask your health care provider what your target blood pressure should be.  Have your blood sugar (glucose) level and cholesterol levels checked regularly. Follow your health care provider's instructions on how to keep levels within normal limits.  Avoid heavy lifting and activities that take a lot of effort (are strenuous). Ask your health care provider what activities are safe for you.  Keep all follow-up visits as told by your health care provider. This is important. Contact a health care provider if you:  Have pain in your abdomen, side, or back.  Have a throbbing feeling in your abdomen. Get help right away if you:  Have sudden, severe pain in your abdomen, side, or back.  Experience nausea or vomiting.  Have constipation or problems urinating.  Feel light-headed.  Have a rapid heart rate when you stand.  Have sweaty, clammy skin.  Have shortness of breath.  Have a fever. These symptoms may represent a serious problem that is an emergency. Do not wait to see if the symptoms will go away. Get medical help right away. Call your local emergency services (911 in the [place] Do not drive yourself to the hospital. Summary  An aneurysm is a bulge in an artery. It happens when blood pushes against a weakened or damaged artery wall.  Being older, male, Caucasian, having a history of tobacco use, and a family history of aneurysms can increase the risk.  These problems can cause bleeding inside the body and can be  life-threatening. Get medical help right away. This information is not intended to replace advice given to you by your health care provider. Make sure you discuss any questions you have with your health care provider. Document Released: 08/09/2005 Document Revised: 06/08/2018 Document Reviewed: 06/08/2018 Elsevier Interactive Patient Education  2019 Elsevier Inc.  

## 2019-04-22 NOTE — Assessment & Plan Note (Signed)
lipid control important in reducing the progression of atherosclerotic disease. Continue statin therapy  

## 2019-04-22 NOTE — Assessment & Plan Note (Signed)
Duplex today shows no change in the abdominal aortic aneurysm measuring approximately 4.0 cm in maximal diameter.  At this point, we will do another 23-month follow-up interval.  Blood pressure control and abstinence from tobacco are the main things to avoid aneurysm growth.  Contact our office with any problems in the interim.

## 2019-05-06 ENCOUNTER — Encounter: Payer: Self-pay | Admitting: Internal Medicine

## 2019-05-08 ENCOUNTER — Telehealth: Payer: Self-pay | Admitting: Internal Medicine

## 2019-05-08 NOTE — Telephone Encounter (Signed)
Called patient for COVID-19 pre-screening for in office visit.  Have you recently traveled any where out of the local area in the last 2 weeks? No  Have you been in close contact with a person diagnosed with COVID-19 within the last 2 weeks? No  Do you currently have any of the following symptoms? If so, when did they start? Cough     Diarrhea   Joint Pain Fever      Muscle Pain   Red eyes Shortness of breath   Abdominal pain  Vomiting Loss of smell    Rash    Sore Throat Headache    Weakness   Bruising or bleeding   Okay to proceed with visit 05/09/2019

## 2019-05-08 NOTE — Progress Notes (Signed)
[facility] Consultation      Assessment and Plan:  Severe obstructive sleep apnea. - Review of download data shows mild to moderate obstructive sleep apnea at high CPAP pressure range of 12-18. - Patient was tried on a CPAP pressure range up to 20 but was intolerant to the higher pressure. -Discussed with patient that his main goal is to reduce symptoms of daytime sleepiness which do appear better at this time.  Given the complexity and severity of his sleep apnea we will focus less on the AHI number and more on his symptomatology. - Patient has some skin breakdown on the bridge of his nose, we discussed using a mask liner which he can purchase from [employer].   Return in about 6 months (around 11/08/2019).    Date: 05/08/2019  MRN# [id number] Jamie Zhang 05/13/1941    Merlene Laughter is a 78 y.o. old male seen in consultation for chief complaint of:    Chief Complaint  Patient presents with  . Follow-up    [facility]- has had an increase in apnea events- mask is causing a sore spot on nose, states he does not feel like he has had any change in his condition since starting using the CPAP    HPI:   TANUSH DREES is a 78 y.o. male  who has been diagnosed with obstructive sleep apnea by a test at sleep med on 09/25/2018.  I have been incrementally increasing his CPAP pressure range to take care of residual apneas. At last visit it was adjusted to 15-25 since he was hitting his max of 20.  The pressure was then increased to 15-20, however patient felt that this was too high, therefore it was decreased to 12-18. He feels that he is less sleepy during the day, he had a strong tendency to doze off before which is now gone.    **CPAP download 04/07/2019-05/06/2019>> raw data personally reviewed.  Uses greater than 4 hours 30/30 days.  Average usage on days used is 7 hours 53 minutes, pressure ranges 12-18.  Median pressure is 14.6,-percentile pressure 17.8, max  pressure 17.9.  Residual AHI is 16 which are obstructive. **CPAP download data 10/22/2018-11/14/2018>> uses greater than 4 hours is 24/24 days.  Average usage on days used is 7 hours 47 minutes.  Pressure ranges 10-20, median pressure 15, 95th percentile pressure 19, maximum pressure 19.8.  Residual AHI is 18.6 with an apnea index of 14, central index of 3.  Leaks are within normal limits. **CPAP download 10/17/2018- 10/23/2018>> raw data present reviewed, CPAP setting is 12, average nightly usage is about 8 hours, apnea index is at or above 30 on most of these nights at the pressure of 12.  Upon increase to a pressure range of 6-20 apnea index decreased to approximately 17.  At this range median pressure is 13, 95th percentile pressure is 20 **CPAP titration study 09/19/18>> events eliminated at CPAP of 12, and he was recommend to be on CPAP at 12.  **HST 08/30/18>> AHI of 45.9   Medication:    Current Outpatient Medications:  .  alendronate (FOSAMAX) 70 MG tablet, TAKE 1 TABLET ONCE A WEEK WITH A FULL GLASS OF WATER ON AN EMPTY STOMACH, Disp: 12 tablet, Rfl: 0 .  aspirin EC 81 MG tablet, Take 81 mg by mouth daily., Disp: , Rfl:  .  atorvastatin (LIPITOR) 20 MG tablet, Take 1 tablet (20 mg total) by mouth daily., Disp: 90 tablet, Rfl: 1 .  Calcium Citrate-Vitamin D (CITRACAL + D PO), Take 650 mg by mouth daily. 2 tablets daily, Disp: , Rfl:  .  Cholecalciferol (VITAMIN D3) 10000 UNITS TABS, Take 1 tablet by mouth daily., Disp: , Rfl:  .  diclofenac (VOLTAREN) 75 MG EC tablet, Take 1 tablet (75 mg total) by mouth 2 (two) times daily., Disp: 60 tablet, Rfl: 2 .  docusate sodium (STOOL SOFTENER) 100 MG capsule, Take 100 mg by mouth 2 (two) times daily., Disp: , Rfl:  .  famotidine (PEPCID) 40 MG tablet, Take 1 tablet (40 mg total) by mouth daily., Disp: 90 tablet, Rfl: 1 .  fluticasone (FLONASE) 50 MCG/ACT nasal spray, USE 2 SPRAYS IN EACH NOSTRIL EVERY DAY, Disp: 48 g, Rfl: 2 .  meclizine (ANTIVERT)  12.5 MG tablet, TAKE 1 TABLET THREE TIMES DAILY AS NEEDED FOR DIZZINESS, Disp: 90 tablet, Rfl: 0   Allergies:  Patient has no known allergies.  Review of Systems:  Constitutional: Feels well. Cardiovascular: Denies chest pain, exertional chest pain.  Pulmonary: Denies hemoptysis, pleuritic chest pain.   The remainder of systems were reviewed and were found to be negative other than what is documented in the HPI.    Physical Examination:   VS: BP 134/74 (BP Location: Left Arm, Cuff Size: Normal)   Pulse 62   SpO2 97%   General Appearance: No distress Mallampati 3, no tonsillar enlargement noted. Neuro:without focal findings, mental status, speech normal, alert and oriented HEENT: PERRLA, EOM intact Pulmonary: No wheezing, No rales  CardiovascularNormal S1,S2.  No m/r/g.  Abdomen: Benign, Soft, non-tender, No masses Renal:  No costovertebral tenderness  GU:  No performed at this time. Endoc: No evident thyromegaly, no signs of acromegaly or Cushing features Skin:   warm, no rashes, no ecchymosis  Extremities: normal, no cyanosis, clubbing.      LABORATORY PANEL:   CBC No results for input(s): WBC, HGB, HCT, PLT in the last 168 hours. ------------------------------------------------------------------------------------------------------------------  Chemistries  No results for input(s): NA, K, CL, CO2, GLUCOSE, BUN, CREATININE, CALCIUM, MG, AST, ALT, ALKPHOS, BILITOT in the last 168 hours.  Invalid input(s): GFRCGP ------------------------------------------------------------------------------------------------------------------  Cardiac Enzymes No results for input(s): TROPONINI in the last 168 hours. ------------------------------------------------------------  RADIOLOGY:  No results found.     Thank  you for the consultation and for allowing [facility] to assist in the care of your patient. Our recommendations are noted above.  Please  contact us if we can be of further service.   Marda Stalker, M.D., F.C.C.P.  Board Certified in Internal Medicine, Pulmonary Medicine, [place], and Sleep Medicine.  [facility] Office Number: [phone]   05/08/2019

## 2019-05-09 ENCOUNTER — Other Ambulatory Visit: Payer: Self-pay

## 2019-05-09 ENCOUNTER — Ambulatory Visit (INDEPENDENT_AMBULATORY_CARE_PROVIDER_SITE_OTHER): Payer: Medicare Other | Admitting: Internal Medicine

## 2019-05-09 VITALS — BP 134/74 | HR 62

## 2019-05-09 DIAGNOSIS — G4733 Obstructive sleep apnea (adult) (pediatric): Secondary | ICD-10-CM

## 2019-05-09 NOTE — Patient Instructions (Addendum)
Try looking into CPAP mask liners to reduce the skin breakdown. (One popular product is [employer]). You can get liners at [employer].com  Continue using CPAP every night for the entire night.

## 2019-05-27 DIAGNOSIS — D2262 Melanocytic nevi of left upper limb, including shoulder: Secondary | ICD-10-CM | POA: Diagnosis not present

## 2019-05-27 DIAGNOSIS — D2271 Melanocytic nevi of right lower limb, including hip: Secondary | ICD-10-CM | POA: Diagnosis not present

## 2019-05-27 DIAGNOSIS — Z85828 Personal history of other malignant neoplasm of skin: Secondary | ICD-10-CM | POA: Diagnosis not present

## 2019-05-27 DIAGNOSIS — D2261 Melanocytic nevi of right upper limb, including shoulder: Secondary | ICD-10-CM | POA: Diagnosis not present

## 2019-05-27 DIAGNOSIS — D225 Melanocytic nevi of trunk: Secondary | ICD-10-CM | POA: Diagnosis not present

## 2019-05-27 DIAGNOSIS — D2272 Melanocytic nevi of left lower limb, including hip: Secondary | ICD-10-CM | POA: Diagnosis not present

## 2019-05-27 DIAGNOSIS — X32XXXA Exposure to sunlight, initial encounter: Secondary | ICD-10-CM | POA: Diagnosis not present

## 2019-05-27 DIAGNOSIS — L570 Actinic keratosis: Secondary | ICD-10-CM | POA: Diagnosis not present

## 2019-05-27 DIAGNOSIS — Z08 Encounter for follow-up examination after completed treatment for malignant neoplasm: Secondary | ICD-10-CM | POA: Diagnosis not present

## 2019-06-30 ENCOUNTER — Other Ambulatory Visit: Payer: Self-pay

## 2019-06-30 MED ORDER — ALENDRONATE SODIUM 70 MG PO TABS: ORAL_TABLET | ORAL | 0 refills | Status: DC

## 2019-07-28 ENCOUNTER — Encounter: Payer: Self-pay | Admitting: Internal Medicine

## 2019-07-28 ENCOUNTER — Other Ambulatory Visit: Payer: Self-pay

## 2019-07-28 ENCOUNTER — Ambulatory Visit: Payer: Medicare Other | Admitting: Internal Medicine

## 2019-07-28 ENCOUNTER — Ambulatory Visit (INDEPENDENT_AMBULATORY_CARE_PROVIDER_SITE_OTHER): Payer: Medicare Other | Admitting: Internal Medicine

## 2019-07-28 ENCOUNTER — Ambulatory Visit (INDEPENDENT_AMBULATORY_CARE_PROVIDER_SITE_OTHER): Payer: Medicare Other

## 2019-07-28 VITALS — Ht 68.75 in | Wt 201.0 lb

## 2019-07-28 DIAGNOSIS — Z23 Encounter for immunization: Secondary | ICD-10-CM

## 2019-07-28 DIAGNOSIS — M79644 Pain in right finger(s): Secondary | ICD-10-CM | POA: Diagnosis not present

## 2019-07-28 DIAGNOSIS — N138 Other obstructive and reflux uropathy: Secondary | ICD-10-CM | POA: Diagnosis not present

## 2019-07-28 DIAGNOSIS — N401 Enlarged prostate with lower urinary tract symptoms: Secondary | ICD-10-CM | POA: Diagnosis not present

## 2019-07-28 DIAGNOSIS — R7303 Prediabetes: Secondary | ICD-10-CM | POA: Diagnosis not present

## 2019-07-28 DIAGNOSIS — G4733 Obstructive sleep apnea (adult) (pediatric): Secondary | ICD-10-CM | POA: Diagnosis not present

## 2019-07-28 DIAGNOSIS — R339 Retention of urine, unspecified: Secondary | ICD-10-CM

## 2019-07-28 DIAGNOSIS — G8929 Other chronic pain: Secondary | ICD-10-CM | POA: Insufficient documentation

## 2019-07-28 DIAGNOSIS — E7800 Pure hypercholesterolemia, unspecified: Secondary | ICD-10-CM

## 2019-07-28 NOTE — Assessment & Plan Note (Signed)
Severe,  With recent initiation of CPAP.  Control is improving with change in ill fitting mask .  pulmonology managing

## 2019-07-28 NOTE — Assessment & Plan Note (Signed)
checkng PSA and UA  . If normal will start Flomax, .  IF PSA is elevated,  Urology referral progres

## 2019-07-28 NOTE — Assessment & Plan Note (Signed)
She is having persistent pain in the thumb base .  Prior steroid injection did not help.  He is requesting a second opinion from another hand surgeon

## 2019-07-28 NOTE — Progress Notes (Signed)
Virtual Visit via [email or web address]  This visit type was conducted due to national recommendations for restrictions regarding the COVID-19 pandemic (e.g. social distancing).  This format is felt to be most appropriate for this patient at this time.  All issues noted in this document were discussed and addressed.  No physical exam was performed (except for noted visual exam findings with Video Visits).   I connected with@ on 07/28/19 at 11:00 AM EDT by a video enabled telemedicine application or telephone and verified that I am speaking with the correct person using two identifiers. Location patient: home Location provider: work or home office Persons participating in the virtual visit: patient, provider  I discussed the limitations, risks, security and privacy concerns of performing an evaluation and management service by telephone and the availability of in person appointments. I also discussed with the patient that there may be a patient responsible charge related to this service. The patient expressed understanding and agreed to proceed.  Reason for visit:follow up on multiple issues  HPI:  Urinary hesitancy and retenion for the past several months ,  Occurring in the morning  Right thumb pain x 1 year  Saw [facility]'s hand surgeon last year,  Had an injection,  Did not help] BO 120/67 returning to [facility] starting wedensday  Treadmill.  Has not been in 6 months  Sleeps 8 hours with CPAP    One nighttime void.  Using diclofenac at night  And magensium and stool softener  Some early morning hesitancy for the past 4-5 months, incomplete emptying   ROS: See pertinent positives and negatives per HPI.  Past Medical History:  Diagnosis Date  . Arthritis   . Cataract 01/02/2011   Bilateral - s/p excision  . Colon polyps 2000   Hyperplastic - Repeat colonoscopy 2015  . GERD (gastroesophageal reflux disease)    Pantoprazole  . Hemorrhoids 2006  . HLD (hyperlipidemia)   . Iron deficiency  anemia 03/20/2006   Donating too much blood  . Migraine    No longer has problems  . Osteopenia    on Fosamax  . RLS (restless legs syndrome) 03/20/2006   2/2 iron deficiency  . Schatzki's ring   . Skin cancer 01/04/2012   Basal cell nose, squamous cell scalp - Dr. Ula Lingo    Past Surgical History:  Procedure Laterality Date  . BREAST BIOPSY Left    benign  . Cataract Surgery Bilateral   . COLONOSCOPY WITH PROPOFOL N/A 04/18/2017   Procedure: COLONOSCOPY WITH PROPOFOL;  Surgeon: Robert Bellow, MD;  Location: [facility] ENDOSCOPY;  Service: Endoscopy;  Laterality: N/A;  . INGUINAL HERNIA REPAIR Left   . ROTATOR CUFF REPAIR Left     Family History  Problem Relation Age of Onset  . Alcohol abuse Mother   . Arthritis Mother   . Hypertension Mother   . Cancer Father 73       Throat Cancer - died  . Alcohol abuse Father   . Cancer Sister 45       Breast Cancer  . Alcohol abuse Sister     SOCIAL HX:  reports that he has quit smoking. His smoking use included cigarettes. He has a 42.00 pack-year smoking history. He has never used smokeless tobacco. He reports current alcohol use. He reports that he does not use drugs.   Current Outpatient Medications:  .  alendronate (FOSAMAX) 70 MG tablet, TAKE 1 TABLET ONCE A WEEK WITH A FULL GLASS OF WATER ON AN EMPTY STOMACH, Disp:  12 tablet, Rfl: 0 .  aspirin EC 81 MG tablet, Take 81 mg by mouth daily., Disp: , Rfl:  .  atorvastatin (LIPITOR) 20 MG tablet, Take 1 tablet (20 mg total) by mouth daily., Disp: 90 tablet, Rfl: 1 .  Calcium Citrate-Vitamin D (CITRACAL + D PO), Take 650 mg by mouth daily. 2 tablets daily, Disp: , Rfl:  .  Cholecalciferol (VITAMIN D3) 10000 UNITS TABS, Take 1 tablet by mouth daily., Disp: , Rfl:  .  diclofenac (VOLTAREN) 75 MG EC tablet, Take 1 tablet (75 mg total) by mouth 2 (two) times daily., Disp: 60 tablet, Rfl: 2 .  docusate sodium (STOOL SOFTENER) 100 MG capsule, Take 100 mg by mouth 2 (two) times daily., Disp: ,  Rfl:  .  famotidine (PEPCID) 40 MG tablet, Take 1 tablet (40 mg total) by mouth daily., Disp: 90 tablet, Rfl: 1 .  fluticasone (FLONASE) 50 MCG/ACT nasal spray, USE 2 SPRAYS IN EACH NOSTRIL EVERY DAY, Disp: 48 g, Rfl: 2 .  meclizine (ANTIVERT) 12.5 MG tablet, TAKE 1 TABLET THREE TIMES DAILY AS NEEDED FOR DIZZINESS, Disp: 90 tablet, Rfl: 0  EXAM:  VITALS per patient if applicable:  GENERAL: alert, oriented, appears well and in no acute distress  HEENT: atraumatic, conjunttiva clear, no obvious abnormalities on inspection of external nose and ears  NECK: normal movements of the head and neck  LUNGS: on inspection no signs of respiratory distress, breathing rate appears normal, no obvious gross SOB, gasping or wheezing  CV: no obvious cyanosis  MS: moves all visible extremities without noticeable abnormality  PSYCH/NEURO: pleasant and cooperative, no obvious depression or anxiety, speech and thought processing grossly intact  ASSESSMENT AND PLAN:  Discussed the following assessment and plan:  Prediabetes - Plan: Hemoglobin A1c, Comprehensive metabolic panel  Urinary retention - Plan: Urinalysis, Routine w reflex microscopic, Urine Culture, PSA  Pure hypercholesterolemia - Plan: Lipid panel  Chronic thumb pain, right - Plan: Ambulatory referral to Hand Surgery  BPH with obstruction/lower urinary tract symptoms  OSA (obstructive sleep apnea)  BPH with obstruction/lower urinary tract symptoms checkng PSA and UA  . If normal will start Flomax, .  IF PSA is elevated,  Urology referral progres  OSA (obstructive sleep apnea) Severe,  With recent initiation of CPAP.  Control is improving with change in ill fitting mask .  pulmonology managing  Chronic thumb pain, right She is having persistent pain in the thumb base .  Prior steroid injection did not help.  He is requesting a second opinion from another hand surgeon     I discussed the assessment and treatment plan with the  patient. The patient was provided an opportunity to ask questions and all were answered. The patient agreed with the plan and demonstrated an understanding of the instructions.   The patient was advised to call back or seek an in-person evaluation if the symptoms worsen or if the condition fails to improve as anticipated.  I provided 25 minutes of non-face-to-face time during this encounter.   Crecencio Mc, MD

## 2019-08-08 ENCOUNTER — Other Ambulatory Visit: Payer: Self-pay

## 2019-08-08 ENCOUNTER — Other Ambulatory Visit (INDEPENDENT_AMBULATORY_CARE_PROVIDER_SITE_OTHER): Payer: Medicare Other

## 2019-08-08 DIAGNOSIS — E7800 Pure hypercholesterolemia, unspecified: Secondary | ICD-10-CM | POA: Diagnosis not present

## 2019-08-08 DIAGNOSIS — R7303 Prediabetes: Secondary | ICD-10-CM | POA: Diagnosis not present

## 2019-08-08 DIAGNOSIS — R339 Retention of urine, unspecified: Secondary | ICD-10-CM

## 2019-08-08 LAB — COMPREHENSIVE METABOLIC PANEL
ALT: 32 U/L (ref 0–53)
AST: 20 U/L (ref 0–37)
Albumin: 4.2 g/dL (ref 3.5–5.2)
Alkaline Phosphatase: 49 U/L (ref 39–117)
BUN: 26 mg/dL — ABNORMAL HIGH (ref 6–23)
CO2: 27 mEq/L (ref 19–32)
Calcium: 9.4 mg/dL (ref 8.4–10.5)
Chloride: 105 mEq/L (ref 96–112)
Creatinine, Ser: 1 mg/dL (ref 0.40–1.50)
GFR: 72.02 mL/min (ref >60.00)
Glucose, Bld: 93 mg/dL (ref 70–99)
Potassium: 4.2 mEq/L (ref 3.5–5.1)
Sodium: 140 mEq/L (ref 135–145)
Total Bilirubin: 1.3 mg/dL — ABNORMAL HIGH (ref 0.2–1.2)
Total Protein: 6.1 g/dL (ref 6.0–8.3)

## 2019-08-08 LAB — URINALYSIS, ROUTINE W REFLEX MICROSCOPIC
Bilirubin Urine: NEGATIVE
Hgb urine dipstick: NEGATIVE
Ketones, ur: NEGATIVE
Leukocytes,Ua: NEGATIVE
Nitrite: NEGATIVE
RBC / HPF: NONE SEEN (ref 0–?)
Specific Gravity, Urine: 1.015 (ref 1.000–1.030)
Total Protein, Urine: NEGATIVE
Urine Glucose: NEGATIVE
Urobilinogen, UA: 0.2 (ref 0.0–1.0)
WBC, UA: NONE SEEN (ref 0–?)
pH: 8 (ref 5.0–8.0)

## 2019-08-08 LAB — PSA: PSA: 1.19 ng/mL (ref 0.10–4.00)

## 2019-08-08 LAB — LIPID PANEL
Cholesterol: 110 mg/dL (ref 0–200)
HDL: 51.9 mg/dL (ref >39.00)
LDL Cholesterol: 42 mg/dL (ref 0–99)
NonHDL: 57.97
Total CHOL/HDL Ratio: 2
Triglycerides: 79 mg/dL (ref 0.0–149.0)
VLDL: 15.8 mg/dL (ref 0.0–40.0)

## 2019-08-08 LAB — HEMOGLOBIN A1C: Hgb A1c MFr Bld: 5.7 % (ref 4.6–6.5)

## 2019-08-08 NOTE — Addendum Note (Signed)
Addended by: Leeanne Rio on: 08/08/2019 09:15 AM   Modules accepted: Orders

## 2019-08-12 MED ORDER — FLUTICASONE PROPIONATE 50 MCG/ACT NA SUSP: 2.0000 | Freq: Every day | NASAL | 2 refills | Status: DC

## 2019-09-02 MED ORDER — FAMOTIDINE 40 MG PO TABS: 40.0000 mg | ORAL_TABLET | Freq: Every day | ORAL | 3 refills | Status: DC

## 2019-09-04 DIAGNOSIS — M79644 Pain in right finger(s): Secondary | ICD-10-CM | POA: Diagnosis not present

## 2019-09-04 DIAGNOSIS — M1811 Unilateral primary osteoarthritis of first carpometacarpal joint, right hand: Secondary | ICD-10-CM | POA: Diagnosis not present

## 2019-09-24 ENCOUNTER — Other Ambulatory Visit: Payer: Self-pay | Admitting: *Deleted

## 2019-09-24 MED ORDER — ALENDRONATE SODIUM 70 MG PO TABS: ORAL_TABLET | ORAL | 0 refills | Status: DC

## 2019-10-22 DIAGNOSIS — L570 Actinic keratosis: Secondary | ICD-10-CM | POA: Diagnosis not present

## 2019-10-24 ENCOUNTER — Encounter (INDEPENDENT_AMBULATORY_CARE_PROVIDER_SITE_OTHER): Payer: Self-pay | Admitting: Vascular Surgery

## 2019-10-24 ENCOUNTER — Other Ambulatory Visit: Payer: Self-pay

## 2019-10-24 ENCOUNTER — Ambulatory Visit (INDEPENDENT_AMBULATORY_CARE_PROVIDER_SITE_OTHER): Payer: Medicare Other | Admitting: Vascular Surgery

## 2019-10-24 ENCOUNTER — Ambulatory Visit (INDEPENDENT_AMBULATORY_CARE_PROVIDER_SITE_OTHER): Payer: Medicare Other

## 2019-10-24 VITALS — BP 123/78 | HR 56 | Resp 14 | Ht 69.0 in | Wt 207.0 lb

## 2019-10-24 DIAGNOSIS — I714 Abdominal aortic aneurysm, without rupture, unspecified: Secondary | ICD-10-CM

## 2019-10-24 DIAGNOSIS — E781 Pure hyperglyceridemia: Secondary | ICD-10-CM

## 2019-10-24 NOTE — Patient Instructions (Signed)
Abdominal Aortic Aneurysm  An aneurysm is a bulge in one of the blood vessels that carry blood away from the heart (artery). It happens when blood pushes up against a weak or damaged place in the wall of an artery. An abdominal aortic aneurysm happens in the main artery of the body (aorta). Some aneurysms may not cause problems. If it grows, it can burst or tear, causing bleeding inside the body. This is an emergency. It needs to be treated right away. What are the causes? The exact cause of this condition is not known. What increases the risk? The following may make you more likely to get this condition:  Being a male who is 60 years of age or older.  Being white (Caucasian).  Using tobacco.  Having a family history of aneurysms.  Having the following conditions: ? Hardening of the arteries (arteriosclerosis). ? Inflammation of the walls of an artery (arteritis). ? Certain genetic conditions. ? Being very overweight (obesity). ? An infection in the wall of the aorta (infectious aortitis). ? High cholesterol. ? High blood pressure (hypertension). What are the signs or symptoms? Symptoms depend on the size of the aneurysm and how fast it is growing. Most grow slowly and do not cause any symptoms. If symptoms do occur, they may include:  Pain in the belly (abdomen), side, or back.  Feeling full after eating only small amounts of food.  Feeling a throbbing lump in the belly. Symptoms that the aneurysm has burst (ruptured) include:  Sudden, very bad pain in the belly, side, or back.  Feeling sick to your stomach (nauseous).  Throwing up (vomiting).  Feeling light-headed or passing out. How is this treated? Treatment for this condition depends on:  The size of the aneurysm.  How fast it is growing.  Your age.  Your risk of having it burst. If your aneurysm is smaller than 2 inches (5 cm), your doctor may manage it by:  Checking it often to see if it is getting bigger.  You may have an imaging test (ultrasound) to check it every 3-6 months, every year, or every few years.  Giving you medicines to: ? Control blood pressure. ? Treat pain. ? Fight infection. If your aneurysm is larger than 2 inches (5 cm), you may need surgery to fix it. Follow these instructions at home: Lifestyle  Do not use any products that have nicotine or tobacco in them. This includes cigarettes, e-cigarettes, and chewing tobacco. If you need help quitting, ask your doctor.  Get regular exercise. Ask your doctor what types of exercise are best for you. Eating and drinking  Eat a heart-healthy diet. This includes eating plenty of: ? Fresh fruits and vegetables. ? Whole grains. ? Low-fat (lean) protein. ? Low-fat dairy products.  Avoid foods that are high in saturated fat and cholesterol. These foods include red meat and some dairy products.  Do not drink alcohol if: ? Your doctor tells you not to drink. ? You are pregnant, may be pregnant, or are planning to become pregnant.  If you drink alcohol: ? Limit how much you use to:  0-1 drink a day for women.  0-2 drinks a day for men. ? Be aware of how much alcohol is in your drink. In the [place] one drink equals any of these:  One typical bottle of beer (12 oz).  One-half glass of wine (5 oz).  One shot of hard liquor (1 oz). General instructions  Take over-the-counter and prescription medicines only as   told by your doctor.  Keep your blood pressure within normal limits. Ask your doctor what your blood pressure should be.  Have your blood sugar (glucose) level and cholesterol levels checked regularly. Keep your blood sugar level and cholesterol levels within normal limits.  Avoid heavy lifting and activities that take a lot of effort. Ask your doctor what activities are safe for you.  Keep all follow-up visits as told by your doctor. This is important. ? Talk to your doctor about regular screenings to see if the  aneurysm is getting bigger. Contact a doctor if you:  Have pain in your belly, side, or back.  Have a throbbing feeling in your belly.  Have a family history of aneurysms. Get help right away if you:  Have sudden, bad pain in your belly, side, or back.  Feel sick to your stomach.  Throw up.  Have trouble pooping (constipation).  Have trouble peeing (urinating).  Feel light-headed.  Have a fast heart rate when you stand.  Have sweaty skin that is cold to the touch (clammy).  Have shortness of breath.  Have a fever. These symptoms may be an emergency. Do not wait to see if the symptoms will go away. Get medical help right away. Call your local emergency services (911 in the [place] Do not drive yourself to the hospital. Summary  An aneurysm is a bulge in one of the blood vessels that carry blood away from the heart (artery). Some aneurysms may not cause problems.  You may need to have yours checked often. If it grows, it can burst or tear. This causes bleeding inside the body. It needs to be treated right away.  Follow instructions from your doctor about healthy lifestyle changes.  Keep all follow-up visits as told by your doctor. This is important. This information is not intended to replace advice given to you by your health care provider. Make sure you discuss any questions you have with your health care provider. Document Released: 02/24/2013 Document Revised: 02/17/2019 Document Reviewed: 06/08/2018 Elsevier Patient Education  2020 Elsevier Inc.  

## 2019-10-24 NOTE — Progress Notes (Signed)
MRN : [id number]  Jamie Zhang is a 78 y.o. ([birth year]) male who presents with chief complaint of No chief complaint on file. [name]  History of Present Illness: Patient returns today in follow up of his abdominal aortic aneurysm.  He is doing well and has no aneurysm related symptoms.  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  The left common iliac artery measures 1.3 cm.  This is slightly smaller than his study from 6 months ago where it measured closer to 4 cm.  Current Outpatient Medications  Medication Sig Dispense Refill  . alendronate (FOSAMAX) 70 MG tablet TAKE 1 TABLET ONCE A WEEK WITH A FULL GLASS OF WATER ON AN EMPTY STOMACH 12 tablet 0  . aspirin EC 81 MG tablet Take 81 mg by mouth daily.    [name] atorvastatin (LIPITOR) 20 MG tablet Take 1 tablet (20 mg total) by mouth daily. 90 tablet 1  . Calcium Citrate-Vitamin D (CITRACAL + D PO) Take 650 mg by mouth daily. 2 tablets daily    . Cholecalciferol (VITAMIN D3) 10000 UNITS TABS Take 1 tablet by mouth daily.    . diclofenac (VOLTAREN) 75 MG EC tablet Take 1 tablet (75 mg total) by mouth 2 (two) times daily. 60 tablet 2  . docusate sodium (STOOL SOFTENER) 100 MG capsule Take 100 mg by mouth 2 (two) times daily.    . famotidine (PEPCID) 40 MG tablet Take 1 tablet (40 mg total) by mouth daily. 90 tablet 3  . fluticasone (FLONASE) 50 MCG/ACT nasal spray Place 2 sprays into both nostrils daily. 48 g 2  . meclizine (ANTIVERT) 12.5 MG tablet TAKE 1 TABLET THREE TIMES DAILY AS NEEDED FOR DIZZINESS 90 tablet 0   No current facility-administered medications for this visit.    Past Medical History:  Diagnosis Date  . Arthritis   . Cataract 01/02/2011   Bilateral - s/p excision  . Colon polyps 2000   Hyperplastic - Repeat colonoscopy 2015  . GERD (gastroesophageal reflux disease)    Pantoprazole  . Hemorrhoids 2006  . HLD (hyperlipidemia)   .  Iron deficiency anemia 03/20/2006   Donating too much blood  . Migraine    No longer has problems  . Osteopenia    on Fosamax  . RLS (restless legs syndrome) 03/20/2006   2/2 iron deficiency  . Schatzki's ring   . Skin cancer 01/04/2012   Basal cell nose, squamous cell scalp - Dr. Ula Lingo    Past Surgical History:  Procedure Laterality Date  . BREAST BIOPSY Left    benign  . Cataract Surgery Bilateral   . COLONOSCOPY WITH PROPOFOL N/A 04/18/2017   Procedure: COLONOSCOPY WITH PROPOFOL;  Surgeon: Robert Bellow, MD;  Location: [facility] ENDOSCOPY;  Service: Endoscopy;  Laterality: N/A;  . INGUINAL HERNIA REPAIR Left   . ROTATOR CUFF REPAIR Left      Social History   Tobacco Use  . Smoking status: Former Smoker    Packs/day: 2.00    Years: 21.00    Pack years: 42.00    Types: Cigarettes  . Smokeless tobacco: Never Used  Substance Use Topics  . Alcohol use: Yes    Comment: Occasional wine glass  . Drug use: No    Family History  Problem Relation Age of Onset  . Alcohol abuse Mother   . Arthritis Mother   . Hypertension Mother   .  Cancer Father 84       Throat Cancer - died  . Alcohol abuse Father   . Cancer Sister 84       Breast Cancer  . Alcohol abuse Sister     No Known Allergies   REVIEW OF SYSTEMS(Negative unless checked)  Constitutional: [] ??Weight loss[] ??Fever[] ??Chills Cardiac:[] ??Chest pain[] ??Chest pressure[] ??Palpitations [] ??Shortness of breath when laying flat [] ??Shortness of breath at rest [] ??Shortness of breath with exertion. Vascular: [] ??Pain in legs with walking[] ??Pain in legsat rest[] ??Pain in legs when laying flat [] ??Claudication [] ??Pain in feet when walking [] ??Pain in feet at rest [] ??Pain in feet when laying flat [] ??History of DVT [] ??Phlebitis [] ??Swelling in legs [] ??Varicose veins [] ??Non-healing ulcers Pulmonary: [] ??Uses home oxygen [] ??Productive cough[] ??Hemoptysis [] ??Wheeze  [] ??COPD [] ??Asthma Neurologic: [] ??Dizziness [] ??Blackouts [] ??Seizures [] ??History of stroke [] ??History of TIA[] ??Aphasia [] ??Temporary blindness[] ??Dysphagia [] ??Weaknessor numbness in arms [] ??Weakness or numbnessin legs Musculoskeletal: [x] ??Arthritis [] ??Joint swelling [] ??Joint pain [x] ??Low back pain Hematologic:[] ??Easy bruising[] ??Easy bleeding [] ??Hypercoagulable state [] ??Anemic  Gastrointestinal:[] ??Blood in stool[] ??Vomiting blood[x] ??Gastroesophageal reflux/heartburn[] ??Abdominal pain Genitourinary: [] ??Chronic kidney disease [] ??Difficulturination [] ??Frequenturination [] ??Burning with urination[] ??Hematuria Skin: [] ??Rashes [] ??Ulcers [] ??Wounds Psychological: [] ??History of anxiety[] ??History of major depression.  Physical Examination  There were no vitals taken for this visit. Gen:  WD/WN, NAD Head: [place]/AT, No temporalis wasting. Ear/Nose/Throat: Hearing grossly intact, nares w/o erythema or drainage Eyes: Conjunctiva clear. Sclera non-icteric Neck: Supple.  Trachea midline Pulmonary:  Good air movement, no use of accessory muscles.  Cardiac: RRR, no JVD Vascular:  Vessel Right Left  Radial Palpable Palpable                                   Gastrointestinal: soft, non-tender/non-distended. No guarding/reflex.  Slight increased aortic impulse. Musculoskeletal: M/S 5/5 throughout.  No deformity or atrophy.  No edema. Neurologic: Sensation grossly intact in extremities.  Symmetrical.  Speech is fluent.  Psychiatric: Judgment intact, Mood & affect appropriate for pt's clinical situation. Dermatologic: No rashes or ulcers noted.  No cellulitis or open wounds.       Labs Recent Results (from the past 2160 hour(s))  PSA     Status: None   Collection Time: 08/08/19  8:52 AM  Result Value Ref Range   PSA 1.19 0.10 - 4.00 ng/mL    Comment: Test performed using Access Hybritech PSA Assay, a  parmagnetic partical, chemiluminecent immunoassay.  Lipid panel     Status: None   Collection Time: 08/08/19  8:52 AM  Result Value Ref Range   Cholesterol 110 0 - 200 mg/dL    Comment: ATP III Classification       Desirable:  < 200 mg/dL               Borderline High:  200 - 239 mg/dL          High:  > = 240 mg/dL   Triglycerides 79.0 0.0 - 149.0 mg/dL    Comment: Normal:  <150 mg/dLBorderline High:  150 - 199 mg/dL   HDL 51.90 >39.00 mg/dL   VLDL 15.8 0.0 - 40.0 mg/dL   LDL Cholesterol 42 0 - 99 mg/dL   Total CHOL/HDL Ratio 2     Comment:                Men          Women1/2 Average Risk     3.4          3.3Average Risk          5.0  4.42X Average Risk          9.6          7.13X Average Risk          15.0          11.0                       NonHDL 57.97     Comment: NOTE:  Non-HDL goal should be 30 mg/dL higher than patient's LDL goal (i.e. LDL goal of < 70 mg/dL, would have non-HDL goal of < 100 mg/dL)  Comprehensive metabolic panel     Status: Abnormal   Collection Time: 08/08/19  8:52 AM  Result Value Ref Range   Sodium 140 135 - 145 mEq/L   Potassium 4.2 3.5 - 5.1 mEq/L   Chloride 105 96 - 112 mEq/L   CO2 27 19 - 32 mEq/L   Glucose, Bld 93 70 - 99 mg/dL   BUN 26 (H) 6 - 23 mg/dL   Creatinine, Ser 1.00 0.40 - 1.50 mg/dL   Total Bilirubin 1.3 (H) 0.2 - 1.2 mg/dL   Alkaline Phosphatase 49 39 - 117 U/L   AST 20 0 - 37 U/L   ALT 32 0 - 53 U/L   Total Protein 6.1 6.0 - 8.3 g/dL   Albumin 4.2 3.5 - 5.2 g/dL   Calcium 9.4 8.4 - 10.5 mg/dL   GFR 72.02 >60.00 mL/min  Urinalysis, Routine w reflex microscopic     Status: Abnormal   Collection Time: 08/08/19  8:52 AM  Result Value Ref Range   Color, Urine YELLOW Yellow;Lt. Yellow;Straw;Dark Yellow;Amber;Green;Red;Brown   APPearance CLEAR Clear;Turbid;Slightly Cloudy;Cloudy   Specific Gravity, Urine 1.015 1.000 - 1.030   pH 8.0 5.0 - 8.0   Total Protein, Urine NEGATIVE Negative   Urine Glucose NEGATIVE Negative   Ketones,  ur NEGATIVE Negative   Bilirubin Urine NEGATIVE Negative   Hgb urine dipstick NEGATIVE Negative   Urobilinogen, UA 0.2 0.0 - 1.0   Leukocytes,Ua NEGATIVE Negative   Nitrite NEGATIVE Negative   WBC, UA none seen 0-2/hpf   RBC / HPF none seen 0-2/hpf   Amorphous Present (A) None;Present  Hemoglobin A1c     Status: None   Collection Time: 08/08/19  8:52 AM  Result Value Ref Range   Hgb A1c MFr Bld 5.7 4.6 - 6.5 %    Comment: Glycemic Control Guidelines for People with Diabetes:Non Diabetic:  <6%Goal of Therapy: <7%Additional Action Suggested:  >8%     Radiology No results found.  Assessment/Plan HLD (hyperlipidemia) lipid control important in reducing the progression of atherosclerotic disease. 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  The left common iliac artery measures 1.3 cm.  This is slightly smaller than his study from 6 months ago where it measured closer to 4 cm.  At this point, I am going to make him a 65-month follow-up.  If it remains less than 4 cm we can go back to an annual visit.  If it approaches her is 4 cm I would position to 4-month follow-ups.   Leotis Pain, MD  10/24/2019 8:36 AM    This note was created with Dragon medical transcription system.  Any errors from dictation are purely unintentional

## 2019-11-03 ENCOUNTER — Ambulatory Visit (INDEPENDENT_AMBULATORY_CARE_PROVIDER_SITE_OTHER): Payer: Medicare Other | Admitting: Acute Care

## 2019-11-03 ENCOUNTER — Encounter: Payer: Self-pay | Admitting: Acute Care

## 2019-11-03 DIAGNOSIS — G4733 Obstructive sleep apnea (adult) (pediatric): Secondary | ICD-10-CM

## 2019-11-03 NOTE — Progress Notes (Addendum)
Virtual Visit via Telephone Note  I connected with Jamie Zhang on 11/03/19 at  3:30 PM EST by telephone and verified that I am speaking with the correct person using two identifiers.  Location: Patient: At home Provider: At the office at [place], [place]   I discussed the limitations, risks, security and privacy concerns of performing an evaluation and management service by telephone and the availability of in person appointments. I also discussed with the patient that there may be a patient responsible charge related to this service. The patient expressed understanding and agreed to proceed.   History of Present Illness: Pt. Presents for follow up for  compliance check/ therapeutic effectiveness of CPAP therapy . Morning headaches>> resolved  He does not feel tired when he wakes up.  No daytime sleepiness >> does dose off if he is doing a puzzle    Observations/Objective: **CPAP titration study 09/19/18>> events eliminated at CPAP of 12, and he was recommend to be on CPAP at 12.  **HST 08/30/18>> AHI of 45.9  Download 10/01/2019 through 10/30/2019 Air sense 10 AutoSet Minimum pressure 12 cm H2O/maximum pressure is 18 cm H2O Usage days 30 of 30 or 100% Greater than 4 hours 30 days or 100% Average use is 7 hours and 45 minutes per night Median pressure 14.9 cm H2O  AHI 17.0, central apneas 0.1/obstructive apneas 14.6/unknown 0.0  Assessment and Plan: Good compliance AHI of 17 on CPAP pressure range 12-18 Very complex sleep study Plan We will increase pressure to 5-20 cm H2O If he is unable to tolerate increased pressures  now that he has mask liners we will focus more on symptoms that AHI and return his pressures to 12-8.  Follow Up Instructions: Follow up with [name] 12/03/2019   I discussed the assessment and treatment plan with the patient. The patient was provided an opportunity to ask questions and all were answered. The patient agreed  with the plan and demonstrated an understanding of the instructions.   The patient was advised to call back or seek an in-person evaluation if the symptoms worsen or if the condition fails to improve as anticipated.  I provided 30 minutes of non-face-to-face time during this encounter.   [name], NP 11/03/2019

## 2019-11-03 NOTE — Patient Instructions (Addendum)
It is nice to talk with you today. You have 17 apneas per hour of sleep.  We would ideally like to see this number lower. We will place an order to increased your CPAP pressures to 15/20 We will do a follow up telephone visit in 6 weeks to ensure improved AHI If you are  unable to tolerate increased pressures  now that he has mask liners we will focus more on symptoms that AHI  Please  call the office if you are unable to tolerate the increase in pressures.  Follow up tele visit Jan 20th at 2:30 pm with [name] Continue on CPAP at bedtime. You appear to be benefiting from the treatment  Goal is to wear for at least 6 hours each night for maximal clinical benefit. Continue to work on weight loss, as the link between excess weight  and sleep apnea is well established.   Remember to establish a good bedtime routine, and work on sleep hygiene.  Limit daytime naps , avoid stimulants such as caffeine and nicotine close to bedtime, exercise daily to promote sleep quality, avoid heavy , spicy, fried , or rich foods before bed. Ensure adequate exposure to natural light during the day,establish a relaxing bedtime routine with a pleasant sleep environment ( Bedroom between 60 and 67 degrees, turn off bright lights , TV or device screens screens , consider black out curtains or white noise machines) Do not drive if sleepy. Remember to clean mask, tubing, filter, and reservoir once weekly with soapy water.  Follow up with [name]  12/03/2019 or before as needed.  Please contact office for sooner follow up if symptoms do not improve or worsen or seek emergency care

## 2019-11-04 ENCOUNTER — Encounter: Payer: Self-pay | Admitting: Acute Care

## 2019-11-10 ENCOUNTER — Ambulatory Visit (INDEPENDENT_AMBULATORY_CARE_PROVIDER_SITE_OTHER): Payer: Medicare Other

## 2019-11-10 ENCOUNTER — Ambulatory Visit (INDEPENDENT_AMBULATORY_CARE_PROVIDER_SITE_OTHER): Payer: Medicare Other | Admitting: Internal Medicine

## 2019-11-10 ENCOUNTER — Other Ambulatory Visit: Payer: Self-pay

## 2019-11-10 ENCOUNTER — Encounter: Payer: Self-pay | Admitting: Internal Medicine

## 2019-11-10 VITALS — BP 127/74 | HR 63 | Ht 69.0 in | Wt 204.0 lb

## 2019-11-10 VITALS — BP 130/79 | HR 66 | Ht 69.0 in | Wt 207.0 lb

## 2019-11-10 DIAGNOSIS — G4733 Obstructive sleep apnea (adult) (pediatric): Secondary | ICD-10-CM

## 2019-11-10 DIAGNOSIS — E559 Vitamin D deficiency, unspecified: Secondary | ICD-10-CM

## 2019-11-10 DIAGNOSIS — I700 Atherosclerosis of aorta: Secondary | ICD-10-CM

## 2019-11-10 DIAGNOSIS — Z8781 Personal history of (healed) traumatic fracture: Secondary | ICD-10-CM

## 2019-11-10 DIAGNOSIS — Z0000 Encounter for general adult medical examination without abnormal findings: Secondary | ICD-10-CM | POA: Diagnosis not present

## 2019-11-10 DIAGNOSIS — R7303 Prediabetes: Secondary | ICD-10-CM | POA: Diagnosis not present

## 2019-11-10 DIAGNOSIS — G2581 Restless legs syndrome: Secondary | ICD-10-CM

## 2019-11-10 DIAGNOSIS — E785 Hyperlipidemia, unspecified: Secondary | ICD-10-CM

## 2019-11-10 NOTE — Assessment & Plan Note (Addendum)
Not tolerating increased pressure of 15 to  20 cm H20 via autotitrating mask .  Although the overnight score showed more leakage,  And less events. Pulmonology managing.

## 2019-11-10 NOTE — Assessment & Plan Note (Signed)
He has been taking alendronate since 2014 and T scores note mild osteopenia.  Last DEXA 2019  .  Stopping alendronate and will repeat DEXA in one year,

## 2019-11-10 NOTE — Progress Notes (Signed)
Virtual Visit via [email or web address]  This visit type was conducted due to national recommendations for restrictions regarding the COVID-19 pandemic (e.g. social distancing).  This format is felt to be most appropriate for this patient at this time.  All issues noted in this document were discussed and addressed.  No physical exam was performed (except for noted visual exam findings with Video Visits).   I connected with@ on 11/10/19 at  9:00 AM EST by a video enabled telemedicine application and verified that I am speaking with the correct person using two identifiers. Location patient: home Location provider: work or home office Persons participating in the virtual visit: patient, provider  PATIENT HAD [place]   I discussed the limitations, risks, security and privacy concerns of performing an evaluation and management service by telephone and the availability of in person appointments. I also discussed with the patient that there may be a patient responsible charge related to this service. The patient expressed understanding and agreed to proceed.  Reason for visit: follow up  HPI:  The patient has no signs or symptoms of COVID 19 infection (fever, cough, sore throat  or shortness of breath beyond what is typical for patient).  Patient denies contact with other persons with the above mentioned symptoms or with anyone confirmed to have COVID 19 . There has been  One COVID DEATH at [facility], a  22 yr old. . Several positive coworkers and several residents.     OSA:  Recently had his settings changed on his CPAP and is not tolerating higher CPAP pressures   Weight is steady 202 to 205  Walking on a treadmill daily 30 min 6 days per week .  Not bothered by the neuropathy  RLS: Symptoms late at night.  Improved with going to sleep   Taking alendronate for the past 6 years  For osteopenia .  Discussed taking a holiday   from medication  Taking citracal plus D3 (500 mg calcium  plus  1000 ius)   Nice EYE 2 weeks ago,  No glaucoma or MD   Bowels moving regularly with Mg and miralax.       ROS: See pertinent positives and negatives per HPI.  Past Medical History:  Diagnosis Date  . Arthritis   . Cataract 01/02/2011   Bilateral - s/p excision  . Colon polyps 2000   Hyperplastic - Repeat colonoscopy 2015  . GERD (gastroesophageal reflux disease)    Pantoprazole  . Hemorrhoids 2006  . HLD (hyperlipidemia)   . Iron deficiency anemia 03/20/2006   Donating too much blood  . Migraine    No longer has problems  . Osteopenia    on Fosamax  . RLS (restless legs syndrome) 03/20/2006   2/2 iron deficiency  . Schatzki's ring   . Skin cancer 01/04/2012   Basal cell nose, squamous cell scalp - Dr. Ula Lingo    Past Surgical History:  Procedure Laterality Date  . BREAST BIOPSY Left    benign  . Cataract Surgery Bilateral   . COLONOSCOPY WITH PROPOFOL N/A 04/18/2017   Procedure: COLONOSCOPY WITH PROPOFOL;  Surgeon: Robert Bellow, MD;  Location: [facility] ENDOSCOPY;  Service: Endoscopy;  Laterality: N/A;  . INGUINAL HERNIA REPAIR Left   . ROTATOR CUFF REPAIR Left     Family History  Problem Relation Age of Onset  . Alcohol abuse Mother   . Arthritis Mother   . Hypertension Mother   . Cancer Father 60  Throat Cancer - died  . Alcohol abuse Father   . Cancer Sister 1       Breast Cancer  . Alcohol abuse Sister     SOCIAL HX:  reports that he quit smoking about 41 years ago. His smoking use included cigarettes. He has a 42.00 pack-year smoking history. He has never used smokeless tobacco. He reports current alcohol use. He reports that he does not use drugs.   Current Outpatient Medications:  .  alendronate (FOSAMAX) 70 MG tablet, TAKE 1 TABLET ONCE A WEEK WITH A FULL GLASS OF WATER ON AN EMPTY STOMACH, Disp: 12 tablet, Rfl: 0 .  aspirin EC 81 MG tablet, Take 81 mg by mouth daily., Disp: , Rfl:  .  atorvastatin (LIPITOR) 20 MG tablet, Take 1 tablet (20 mg  total) by mouth daily., Disp: 90 tablet, Rfl: 1 .  Calcium Citrate-Vitamin D (CITRACAL + D PO), Take 650 mg by mouth daily. 2 tablets daily, Disp: , Rfl:  .  Cholecalciferol (VITAMIN D3) 10000 UNITS TABS, Take 1 tablet by mouth daily., Disp: , Rfl:  .  diclofenac (VOLTAREN) 75 MG EC tablet, Take 1 tablet (75 mg total) by mouth 2 (two) times daily., Disp: 60 tablet, Rfl: 2 .  docusate sodium (STOOL SOFTENER) 100 MG capsule, Take 100 mg by mouth 2 (two) times daily., Disp: , Rfl:  .  famotidine (PEPCID) 40 MG tablet, Take 1 tablet (40 mg total) by mouth daily., Disp: 90 tablet, Rfl: 3 .  fluticasone (FLONASE) 50 MCG/ACT nasal spray, Place 2 sprays into both nostrils daily., Disp: 48 g, Rfl: 2 .  meclizine (ANTIVERT) 12.5 MG tablet, TAKE 1 TABLET THREE TIMES DAILY AS NEEDED FOR DIZZINESS, Disp: 90 tablet, Rfl: 0  EXAM:  VITALS per patient if applicable:  GENERAL: alert, oriented, appears well and in no acute distress  HEENT: atraumatic, conjunttiva clear, no obvious abnormalities on inspection of external nose and ears  NECK: normal movements of the head and neck  LUNGS: on inspection no signs of respiratory distress, breathing rate appears normal, no obvious gross SOB, gasping or wheezing  CV: no obvious cyanosis  MS: moves all visible extremities without noticeable abnormality  PSYCH/NEURO: pleasant and cooperative, no obvious depression or anxiety, speech and thought processing grossly intact  ASSESSMENT AND PLAN:  Discussed the following assessment and plan:  Atherosclerosis of aorta without gangrene (HCC)  OSA (obstructive sleep apnea)  Prediabetes - Plan: Hemoglobin A1c, Comprehensive metabolic panel  Vitamin D deficiency - Plan: Vitamin D 25 hydroxy  Hyperlipidemia LDL goal <100 - Plan: Lipid panel  History of vertebral compression fracture  Restless legs - Plan: Iron, TIBC and Ferritin Panel  RLS (restless legs syndrome)  OSA (obstructive sleep apnea) Not  tolerating increased pressure of 15 to  20 cm H20 via autotitrating mask .  Although the overnight score showed more leakage,  And less events. Pulmonology managing.  History of vertebral compression fracture He has been taking alendronate since 2014 and T scores note mild osteopenia.  Last DEXA 2019  .  Stopping alendronate and will repeat DEXA in one year,   Prediabetes A1c improved to 5.7  With weight loss and regular exercise . Will repeat every 6 months   Lab Results  Component Value Date   HGBA1C 5.7 08/08/2019     RLS (restless legs syndrome) Checking iron stores.  No treatment requested.     I discussed the assessment and treatment plan with the patient. The patient was provided an  opportunity to ask questions and all were answered. The patient agreed with the plan and demonstrated an understanding of the instructions.   The patient was advised to call back or seek an in-person evaluation if the symptoms worsen or if the condition fails to improve as anticipated.  I provided  25 minutes of non-face-to-face time during this encounter reviewing patient's current problems and past procedures/imaging studies, providing counseling on the above mentioned problems , and coordination  of care .   Crecencio Mc, MD

## 2019-11-10 NOTE — Patient Instructions (Signed)
You need 60 ounces of water daily  For healthy bowels and kidney   We will repeat your labs in 3 months   Stop the alendronate and we'll repeat DEXA next year

## 2019-11-10 NOTE — Assessment & Plan Note (Signed)
A1c improved to 5.7  With weight loss and regular exercise . Will repeat every 6 months   Lab Results  Component Value Date   HGBA1C 5.7 08/08/2019

## 2019-11-10 NOTE — Progress Notes (Addendum)
Subjective:   Jamie Zhang is a 78 y.o. male who presents for Medicare Annual/Subsequent preventive examination.  Review of Systems:  No ROS.  Medicare Wellness Virtual Visit.  Visual/audio telehealth visit, UTA vital signs.   Wt/Ht provided by patient. See social history for additional risk factors.   Cardiac Risk Factors include: advanced age (>42men, >53 women)     Objective:    Vitals: BP 130/79 (BP Location: Left Arm, Patient Position: Sitting, Cuff Size: Normal)   Pulse 66   Ht 5\' 9"  (1.753 m)   Wt 207 lb (93.9 kg)   BMI 30.57 kg/m   Body mass index is 30.57 kg/m.  Advanced Directives 11/10/2019 11/28/2018 11/07/2018 11/02/2017 04/18/2017 11/02/2016 10/03/2016  Does Patient Have a Medical Advance Directive? Yes No Yes Yes Yes Yes No  Type of Paramedic of [place];Living will - [place];Living will Living will;Healthcare Power of Attorney Living will [place];Living will -  Does patient want to make changes to medical advance directive? No - Patient declined - No - Patient declined No - Patient declined - No - Patient declined -  Copy of [place] in Chart? Yes - validated most recent copy scanned in chart (See row information) - Yes - validated most recent copy scanned in chart (See row information) Yes - No - copy requested -  Would patient like information on creating a medical advance directive? - No - Patient declined - - - - -    Tobacco Social History   Tobacco Use  Smoking Status Former Smoker  . Packs/day: 2.00  . Years: 21.00  . Pack years: 42.00  . Types: Cigarettes  . Quit date: 9  . Years since quitting: 41.0  Smokeless Tobacco Never Used     Counseling given: Not Answered   Clinical Intake:  Pre-visit preparation completed: Yes        Diabetes: No  How often do you need to have someone help you when you read instructions, pamphlets, or other written  materials from your doctor or pharmacy?: 1 - Never  Interpreter Needed?: No     Past Medical History:  Diagnosis Date  . Arthritis   . Cataract 01/02/2011   Bilateral - s/p excision  . Colon polyps 2000   Hyperplastic - Repeat colonoscopy 2015  . GERD (gastroesophageal reflux disease)    Pantoprazole  . Hemorrhoids 2006  . HLD (hyperlipidemia)   . Iron deficiency anemia 03/20/2006   Donating too much blood  . Migraine    No longer has problems  . Osteopenia    on Fosamax  . RLS (restless legs syndrome) 03/20/2006   2/2 iron deficiency  . Schatzki's ring   . Skin cancer 01/04/2012   Basal cell nose, squamous cell scalp - Dr. Ula Lingo   Past Surgical History:  Procedure Laterality Date  . BREAST BIOPSY Left    benign  . Cataract Surgery Bilateral   . COLONOSCOPY WITH PROPOFOL N/A 04/18/2017   Procedure: COLONOSCOPY WITH PROPOFOL;  Surgeon: Robert Bellow, MD;  Location: [facility] ENDOSCOPY;  Service: Endoscopy;  Laterality: N/A;  . INGUINAL HERNIA REPAIR Left   . ROTATOR CUFF REPAIR Left    Family History  Problem Relation Age of Onset  . Alcohol abuse Mother   . Arthritis Mother   . Hypertension Mother   . Cancer Father 6       Throat Cancer - died  . Alcohol abuse Father   .  Cancer Sister 68       Breast Cancer  . Alcohol abuse Sister    Social History   Socioeconomic History  . Marital status: Married    Spouse name: Not on file  . Number of children: 2  . Years of education: 39  . Highest education level: Not on file  Occupational History  . Occupation: Freight forwarder    Comment: Worked for [employer]  . Occupation: Personal assistant  . Occupation: Freight forwarder for [employer]  Tobacco Use  . Smoking status: Former Smoker    Packs/day: 2.00    Years: 21.00    Pack years: 42.00    Types: Cigarettes    Quit date: 1980    Years since quitting: 41.0  . Smokeless tobacco: Never Used  Substance and Sexual Activity  . Alcohol use: Yes    Comment: Occasional wine  glass  . Drug use: No  . Sexual activity: Never  Other Topics Concern  . Not on file  Social History Narrative   Jamie Zhang grew up in [place], [place]. He joined the [employer] right out of [employer] and served for 2.5 years then was active in [employer]. 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 He then worked in [employer] and afterward became a Freight forwarder for [employer]. He recently moved to [place] with his wife. They have two daughters that are still living in [place]. As a hobby he enjoys woodwork mainly with cabinet work.    Social Determinants of Health   Financial Resource Strain:   . Difficulty of Paying Living Expenses: Not on file  Food Insecurity:   . Worried About Charity fundraiser in the Last Year: Not on file  . Ran Out of Food in the Last Year: Not on file  Transportation Needs:   . Lack of Transportation (Medical): Not on file  . Lack of Transportation (Non-Medical): Not on file  Physical Activity:   . Days of Exercise per Week: Not on file  . Minutes of Exercise per Session: Not on file  Stress:   . Feeling of Stress : Not on file  Social Connections:   . Frequency of Communication with Friends and Family: Not on file  . Frequency of Social Gatherings with Friends and Family: Not on file  . Attends Religious Services: Not on file  . Active Member of Clubs or Organizations: Not on file  . Attends Archivist Meetings: Not on file  . Marital Status: Not on file    Outpatient Encounter Medications as of 11/10/2019  Medication Sig  . alendronate (FOSAMAX) 70 MG tablet TAKE 1 TABLET ONCE A WEEK WITH A FULL GLASS OF WATER ON AN EMPTY STOMACH  . aspirin EC 81 MG tablet Take 81 mg by mouth daily.  [name] atorvastatin (LIPITOR) 20 MG tablet Take 1 tablet (20 mg total) by mouth daily.  . Calcium Citrate-Vitamin D (CITRACAL + D PO) Take 650 mg by mouth daily. 2 tablets daily  . Cholecalciferol (VITAMIN D3)  10000 UNITS TABS Take 1 tablet by mouth daily.  . diclofenac (VOLTAREN) 75 MG EC tablet Take 1 tablet (75 mg total) by mouth 2 (two) times daily.  [name] docusate sodium (STOOL SOFTENER) 100 MG capsule Take 100 mg by mouth 2 (two) times daily.  . famotidine (PEPCID) 40 MG tablet Take 1 tablet (40 mg total) by mouth daily.  . fluticasone (FLONASE) 50 MCG/ACT  nasal spray Place 2 sprays into both nostrils daily.  . meclizine (ANTIVERT) 12.5 MG tablet TAKE 1 TABLET THREE TIMES DAILY AS NEEDED FOR DIZZINESS   No facility-administered encounter medications on file as of 11/10/2019.    Activities of Daily Living In your present state of health, do you have any difficulty performing the following activities: 11/10/2019  Hearing? N  Vision? N  Difficulty concentrating or making decisions? N  Walking or climbing stairs? N  Dressing or bathing? N  Doing errands, shopping? N  Preparing Food and eating ? N  Using the Toilet? N  In the past six months, have you accidently leaked urine? N  Do you have problems with loss of bowel control? N  Managing your Medications? N  Managing your Finances? N  Housekeeping or managing your Housekeeping? N  Some recent data might be hidden    Patient Care Team: Crecencio Mc, MD as PCP - General (Internal Medicine) Crecencio Mc, MD (Internal Medicine) Bary Castilla Forest Gleason, MD (General Surgery)   Assessment:   This is a routine wellness examination for [employer].  Nurse connected with patient 11/10/19 at  8:30 AM EST by a telephone enabled telemedicine application and verified that I am speaking with the correct person using two identifiers. Patient stated full name and DOB. Patient gave permission to continue with virtual visit. Patient's location was at home and Nurse's location was at [place] office.   Patient is alert and oriented x3. Patient denies difficulty focusing or concentrating. Patient likes to read, play computer games, complete puzzles for brain  stimulation.   Health Maintenance Due: See completed HM at the end of note.   Eye: Visual acuity not assessed. Virtual visit. Followed by their ophthalmologist.  Dental: UTD  Hearing: Demonstrates normal hearing during visit.  Safety:  Patient feels safe at home- yes Patient does have smoke detectors at home- yes Patient does wear sunscreen or protective clothing when in direct sunlight - yes Patient does wear seat belt when in a moving vehicle - yes Patient drives- yes Adequate lighting in walkways free from debris- yes Grab bars and handrails used as appropriate- yes Ambulates with an assistive device- no  Social: Alcohol intake - yes      Smoking history- former   Smokers in home? none Illicit drug use? none  Medication: Taking as directed and without issues.  Self managed - yes   Covid-19: Precautions and sickness symptoms discussed. Wears mask, social distancing, hand hygiene as appropriate.   Activities of Daily Living Patient denies needing assistance with: household chores, feeding themselves, getting from bed to chair, getting to the toilet, bathing/showering, dressing, managing money, or preparing meals.   Discussed the importance of a healthy diet, water intake and the benefits of aerobic exercise.  Educational material provided.  Physical activity- treadmill daily 30 minutes  Diet:  Regular Water: good intake  Other Providers Patient Care Team: Crecencio Mc, MD as PCP - General (Internal Medicine) Crecencio Mc, MD (Internal Medicine) Bary Castilla Forest Gleason, MD (General Surgery)  Exercise Activities and Dietary recommendations Current Exercise Habits: Home exercise routine, Intensity: Mild  Goals      Patient Stated   . DIET - REDUCE SUGAR INTAKE (pt-stated)       Fall Risk Fall Risk  11/10/2019 07/28/2019 11/07/2018 09/30/2018 11/02/2017  Falls in the past year? 0 0 0 0 No  Comment - - - Emmi Telephone Survey: data to providers prior to  load -  Follow up  Education provided Falls evaluation completed - - -   Timed Get Up and Go Performed: no, virtual visit  Depression Screen PHQ 2/9 Scores 11/10/2019 11/07/2018 11/02/2017 11/02/2016  PHQ - 2 Score 0 0 0 0    Cognitive Function MMSE - Mini Mental State Exam 11/02/2016  Orientation to time 5  Orientation to Place 5  Registration 3  Attention/ Calculation 5  Recall 3  Language- name 2 objects 2  Language- repeat 1  Language- follow 3 step command 3  Language- read & follow direction 1  Write a sentence 1  Copy design 1  Total score 30     6CIT Screen 11/10/2019 11/07/2018 11/02/2017  What Year? 0 points 0 points 0 points  What month? 0 points 0 points 0 points  What time? 0 points 0 points 0 points  Count back from 20 - 0 points 0 points  Months in reverse - 0 points 0 points  Repeat phrase - 0 points 0 points  Total Score - 0 0    Immunization History  Administered Date(s) Administered  . Fluad Quad(high Dose 65+) 07/28/2019  . Influenza Split 09/01/2015  . Influenza, High Dose Seasonal PF 08/14/2016, 08/19/2018  . Influenza-Unspecified 08/25/2014, 08/01/2017  . Pneumococcal Conjugate-13 05/04/2015  . Pneumococcal Polysaccharide-23 09/19/2005  . Tdap 08/08/2010  . Zoster Recombinat (Shingrix) 07/10/2018, 09/18/2018   Screening Tests Health Maintenance  Topic Date Due  . COLONOSCOPY  04/18/2020  . TETANUS/TDAP  08/08/2020  . INFLUENZA VACCINE  Completed  . PNA vac Low Risk Adult  Completed        Plan:   Keep all routine maintenance appointments.   Follow up with your doctor today @ 9:00  Medicare Attestation I have personally reviewed: The patient's medical and social history Their use of alcohol, tobacco or illicit drugs Their current medications and supplements The patient's functional ability including ADLs,fall risks, home safety risks, cognitive, and hearing and visual impairment Diet and physical activities Evidence for  depression   I have reviewed and discussed with patient certain preventive protocols, quality metrics, and best practice recommendations.     OBrien-Blaney, Tyrica Afzal L, LPN  [id number]   I have reviewed the above information and agree with above.   Deborra Medina, MD

## 2019-11-10 NOTE — Patient Instructions (Addendum)
  [name] , Thank you for taking time to come for your Medicare Wellness Visit. I appreciate your ongoing commitment to your health goals. Please review the following plan we discussed and let me know if I can assist you in the future.   These are the goals we discussed: Goals      Patient Stated   . DIET - REDUCE SUGAR INTAKE (pt-stated)       This is a list of the screening recommended for you and due dates:  Health Maintenance  Topic Date Due  . Colon Cancer Screening  04/18/2020  . Tetanus Vaccine  08/08/2020  . Flu Shot  Completed  . Pneumonia vaccines  Completed

## 2019-11-10 NOTE — Assessment & Plan Note (Signed)
Checking iron stores.  No treatment requested.

## 2019-12-03 ENCOUNTER — Ambulatory Visit (INDEPENDENT_AMBULATORY_CARE_PROVIDER_SITE_OTHER): Payer: Medicare Other | Admitting: Internal Medicine

## 2019-12-03 ENCOUNTER — Other Ambulatory Visit: Payer: Self-pay | Admitting: Lab

## 2019-12-03 ENCOUNTER — Other Ambulatory Visit: Payer: Self-pay

## 2019-12-03 ENCOUNTER — Encounter: Payer: Self-pay | Admitting: Internal Medicine

## 2019-12-03 VITALS — BP 120/78 | HR 54 | Temp 98.2°F | Ht 69.0 in | Wt 210.8 lb

## 2019-12-03 DIAGNOSIS — G4733 Obstructive sleep apnea (adult) (pediatric): Secondary | ICD-10-CM

## 2019-12-03 MED ORDER — ATORVASTATIN CALCIUM 20 MG PO TABS: 20.0000 mg | ORAL_TABLET | Freq: Every day | ORAL | 1 refills | Status: DC

## 2019-12-03 NOTE — Patient Instructions (Signed)
Continue CPAP as prescribed. 

## 2019-12-03 NOTE — Progress Notes (Signed)
[facility] Consultation      Date: 12/03/2019  MRN# [id number] Jamie Zhang 12/07/1940     SYNOPSIS THERMAN TREBING is a 79 y.o. male  who has been diagnosed with obstructive sleep apnea by a test at sleep med on 09/25/2018.  I have been incrementally increasing his CPAP pressure range to take care of residual apneas. At last visit it was adjusted to 15-25 since he was hitting his max of 20.  The pressure was then increased to 15-20, however patient felt that this was too high, therefore it was decreased to 12-18. He feels that he is less sleepy during the day, he had a strong tendency to doze off before which is now gone.    **CPAP download 04/07/2019-05/06/2019>> raw data personally reviewed.  Uses greater than 4 hours 30/30 days.  Average usage on days used is 7 hours 53 minutes, pressure ranges 12-18.  Median pressure is 14.6,-percentile pressure 17.8, max pressure 17.9.  Residual AHI is 16 which are obstructive. **CPAP download data 10/22/2018-11/14/2018>> uses greater than 4 hours is 24/24 days.  Average usage on days used is 7 hours 47 minutes.  Pressure ranges 10-20, median pressure 15, 95th percentile pressure 19, maximum pressure 19.8.  Residual AHI is 18.6 with an apnea index of 14, central index of 3.  Leaks are within normal limits. **CPAP download 10/17/2018- 10/23/2018>> raw data present reviewed, CPAP setting is 12, average nightly usage is about 8 hours, apnea index is at or above 30 on most of these nights at the pressure of 12.  Upon increase to a pressure range of 6-20 apnea index decreased to approximately 17.  At this range median pressure is 13, 95th percentile pressure is 20 **CPAP titration study 09/19/18>> events eliminated at CPAP of 12, and he was recommend to be on CPAP at 12.  **HST 08/30/18>> AHI of 45.9  Chief complaint  follow-up sleep apnea  HPI Patient with excellent compliance report He seems to be doing well with his CPAP regimen Currently AHI  is down to 11 however it was previously over 46 Patient feeling great overall  Compliance report reviewed in detail with the patient No evidence of heart failure at this time No evidence or signs of infection at this time No respiratory distress No fevers, chills, nausea, vomiting, diarrhea No evidence of lower extremity edema No evidence hemoptysis   Medication:    Current Outpatient Medications:  .  aspirin EC 81 MG tablet, Take 81 mg by mouth daily., Disp: , Rfl:  .  atorvastatin (LIPITOR) 20 MG tablet, Take 1 tablet (20 mg total) by mouth daily., Disp: 90 tablet, Rfl: 1 .  Calcium Citrate-Vitamin D (CITRACAL + D PO), Take 650 mg by mouth daily. 2 tablets daily, Disp: , Rfl:  .  Cholecalciferol (VITAMIN D3) 10000 UNITS TABS, Take 1 tablet by mouth daily., Disp: , Rfl:  .  diclofenac (VOLTAREN) 75 MG EC tablet, Take 1 tablet (75 mg total) by mouth 2 (two) times daily., Disp: 60 tablet, Rfl: 2 .  docusate sodium (STOOL SOFTENER) 100 MG capsule, Take 100 mg by mouth 2 (two) times daily., Disp: , Rfl:  .  famotidine (PEPCID) 40 MG tablet, Take 1 tablet (40 mg total) by mouth daily., Disp: 90 tablet, Rfl: 3 .  fluticasone (FLONASE) 50 MCG/ACT nasal spray, Place 2 sprays into both nostrils daily., Disp: 48 g, Rfl: 2 .  meclizine (ANTIVERT) 12.5 MG tablet, TAKE 1 TABLET THREE TIMES DAILY AS NEEDED  FOR DIZZINESS, Disp: 90 tablet, Rfl: 0   Allergies:  Patient has no known allergies.   Review of Systems:  Gen:  Denies  fever, sweats, chills weight loss  HEENT: Denies blurred vision, double vision, ear pain, eye pain, hearing loss, nose bleeds, sore throat Cardiac:  No dizziness, chest pain or heaviness, chest tightness,edema, No JVD Resp:   No cough, -sputum production, -shortness of breath,-wheezing, -hemoptysis,  Gi: Denies swallowing difficulty, stomach pain, nausea or vomiting, diarrhea, constipation, bowel incontinence Gu:  Denies bladder incontinence, burning urine Ext:   Denies  Joint pain, stiffness or swelling Skin: Denies  skin rash, easy bruising or bleeding or hives Endoc:  Denies polyuria, polydipsia , polyphagia or weight change Psych:   Denies depression, insomnia or hallucinations  Other:  All other systems negative   Physical Examination:   VS: BP 120/78 (BP Location: Left Arm, Patient Position: Sitting, Cuff Size: Large)   Pulse (!) 54   Temp 98.2 F (36.8 C) (Temporal)   Ht 5\' 9"  (1.753 m)   Wt 210 lb 12.8 oz (95.6 kg)   SpO2 95% Comment: on ra  BMI 31.13 kg/m     Physical Examination:   GENERAL:NAD, no fevers, chills, no weakness no fatigue HEAD: Normocephalic, atraumatic.  EYES: PERLA, EOMI No scleral icterus.  NECK: Supple. No thyromegaly.  No JVD.  PULMONARY: CTA B/L no wheezing, rhonchi, crackles CARDIOVASCULAR: S1 and S2. Regular rate and rhythm. No murmurs GASTROINTESTINAL: Soft, nontender, nondistended. Positive bowel sounds.  MUSCULOSKELETAL: No swelling, clubbing, or edema.  NEUROLOGIC: No gross focal neurological deficits. 5/5 strength all extremities SKIN: No ulceration, lesions, rashes, or cyanosis.  PSYCHIATRIC: Insight, judgment intact. -depression -anxiety ALL OTHER ROS ARE NEGATIVE     Assessment and Plan:  Severe obstructive sleep apnea Patient has excellent compliance report with his CPAP therapy Compliance report reviewed in detail with the patient  Currently CPAP pressure 12-18 No significant leaks noted AHI is down to 11  previous AHI was greater than 45 Patient symptoms seem to be under control No excessive daytime sleepiness No fatigue Patient is having issues with the mask however he seems to have found a solution  COVID-19 EDUCATION: The signs and symptoms of COVID-19 were discussed with the patient and how to seek care for testing.  The importance of social distancing was discussed today. Hand Washing Techniques and avoid touching face was advised.     MEDICATION ADJUSTMENTS/LABS AND TESTS  ORDERED: Continue CPAP as prescribed   CURRENT MEDICATIONS REVIEWED AT LENGTH WITH PATIENT TODAY   Patient satisfied with Plan of action and management. All questions answered  Follow up in 1 year   Folasade Mooty Patricia Pesa, M.D.  [facility]  Medical Director [place] Director [facility] Cardio-Pulmonary Department

## 2020-02-25 DIAGNOSIS — D2261 Melanocytic nevi of right upper limb, including shoulder: Secondary | ICD-10-CM | POA: Diagnosis not present

## 2020-02-25 DIAGNOSIS — X32XXXA Exposure to sunlight, initial encounter: Secondary | ICD-10-CM | POA: Diagnosis not present

## 2020-02-25 DIAGNOSIS — D2271 Melanocytic nevi of right lower limb, including hip: Secondary | ICD-10-CM | POA: Diagnosis not present

## 2020-02-25 DIAGNOSIS — L570 Actinic keratosis: Secondary | ICD-10-CM | POA: Diagnosis not present

## 2020-02-25 DIAGNOSIS — L821 Other seborrheic keratosis: Secondary | ICD-10-CM | POA: Diagnosis not present

## 2020-02-25 DIAGNOSIS — Z85828 Personal history of other malignant neoplasm of skin: Secondary | ICD-10-CM | POA: Diagnosis not present

## 2020-02-25 DIAGNOSIS — D2262 Melanocytic nevi of left upper limb, including shoulder: Secondary | ICD-10-CM | POA: Diagnosis not present

## 2020-04-22 DIAGNOSIS — D126 Benign neoplasm of colon, unspecified: Secondary | ICD-10-CM

## 2020-05-07 ENCOUNTER — Telehealth: Payer: Medicare Other

## 2020-05-07 ENCOUNTER — Other Ambulatory Visit: Payer: Self-pay

## 2020-05-07 MED ORDER — FLUTICASONE PROPIONATE 50 MCG/ACT NA SUSP: 2.0000 | Freq: Every day | NASAL | 2 refills | Status: DC

## 2020-05-21 DIAGNOSIS — G4733 Obstructive sleep apnea (adult) (pediatric): Secondary | ICD-10-CM | POA: Diagnosis not present

## 2020-05-21 DIAGNOSIS — R0602 Shortness of breath: Secondary | ICD-10-CM | POA: Diagnosis not present

## 2020-05-22 DIAGNOSIS — G4733 Obstructive sleep apnea (adult) (pediatric): Secondary | ICD-10-CM | POA: Diagnosis not present

## 2020-05-22 DIAGNOSIS — R0602 Shortness of breath: Secondary | ICD-10-CM | POA: Diagnosis not present

## 2020-05-31 ENCOUNTER — Other Ambulatory Visit: Payer: Self-pay | Admitting: Internal Medicine

## 2020-06-02 ENCOUNTER — Encounter: Payer: Self-pay | Admitting: Gastroenterology

## 2020-06-02 ENCOUNTER — Telehealth (INDEPENDENT_AMBULATORY_CARE_PROVIDER_SITE_OTHER): Payer: Medicare Other | Admitting: Gastroenterology

## 2020-06-02 DIAGNOSIS — Z86010 Personal history of colonic polyps: Secondary | ICD-10-CM

## 2020-06-02 NOTE — Progress Notes (Signed)
Jamie Zhang [address]  [place], [place] [address]  Main: [phone]  Fax: [phone]          HPI:   Virtual Visit via Telephone Note  I connected with patient on 06/02/20 at  1:15 PM EDT by telephone and verified that I am speaking with the correct person using two identifiers.   I discussed the limitations, risks, security and privacy concerns of performing an evaluation and management service by telephone and the availability of in person appointments. I also discussed with the patient that there may be a patient responsible charge related to this service. The patient expressed understanding and agreed to proceed.  Location of the patient: Home Location of provider: Home Participating persons: Patient and provider only    Gastroenterology Consultation  Referring Provider:     Crecencio Mc, MD Primary Care Physician:  Crecencio Mc, MD Reason for Consultation:     History of polyps        HPI:    Chief Complaint  Patient presents with  . New Patient (Initial Visit)    Jamie Zhang is a 79 y.o. y/o male referred for consultation & management  by Dr. Derrel Nip, Aris Everts, MD.  Patient was referred to [place] due to history of polyps.  No family history of colon cancer. The patient denies abdominal or flank pain, anorexia, nausea or vomiting, dysphagia, change in bowel habits or black or bloody stools or weight loss.  Last colonoscopy, June 2018 with Dr. Bary Castilla, with 4 subcentimeter polyps removed.  2 reported to be in the transverse colon and 2 in the rectum.  Transverse colon polyp jar shows tubular adenomas, and the rectal polyp jar shows hyperplastic polyp.  As per result note attached to that pathology report in his chart, Dr. Bary Castilla recommended repeat exam in 5 years if general health allows.  Withdrawal time on this colonoscopy was over 6 minutes, and prep was noted to be good.  Prior to this, 2015 colonoscopy by Dr. Rayann Heman with 4 mm polyp in  the cecum, and six 2 to 5 mm polyps in the distal colon.  Pathology showed tubular adenoma and hyperplastic polyps.   Past Medical History:  Diagnosis Date  . Arthritis   . Cataract 01/02/2011   Bilateral - s/p excision  . Colon polyps 2000   Hyperplastic - Repeat colonoscopy 2015  . GERD (gastroesophageal reflux disease)    Pantoprazole  . Hemorrhoids 2006  . HLD (hyperlipidemia)   . Iron deficiency anemia 03/20/2006   Donating too much blood  . Migraine    No longer has problems  . Osteopenia    on Fosamax  . RLS (restless legs syndrome) 03/20/2006   2/2 iron deficiency  . Schatzki's ring   . Skin cancer 01/04/2012   Basal cell nose, squamous cell scalp - Dr. Ula Lingo    Past Surgical History:  Procedure Laterality Date  . BREAST BIOPSY Left    benign  . Cataract Surgery Bilateral   . COLONOSCOPY WITH PROPOFOL N/A 04/18/2017   Procedure: COLONOSCOPY WITH PROPOFOL;  Surgeon: Robert Bellow, MD;  Location: [facility] ENDOSCOPY;  Service: Endoscopy;  Laterality: N/A;  . INGUINAL HERNIA REPAIR Left   . ROTATOR CUFF REPAIR Left     Prior to Admission medications   Medication Sig Start Date End Date Taking? Authorizing Provider  aspirin EC 81 MG tablet Take 81 mg by mouth daily.   Yes [provider]  atorvastatin (  LIPITOR) 20 MG tablet TAKE 1 TABLET(20 MG) BY MOUTH DAILY 05/31/20  Yes Crecencio Mc, MD  Calcium Citrate-Vitamin D (CITRACAL + D PO) Take 650 mg by mouth daily. 2 tablets daily   Yes [provider]  Cholecalciferol (VITAMIN D3) 10000 UNITS TABS Take 1 tablet by mouth daily.   Yes [provider]  docusate sodium (STOOL SOFTENER) 100 MG capsule Take 100 mg by mouth 2 (two) times daily.   Yes [provider]  famotidine (PEPCID) 40 MG tablet Take 1 tablet (40 mg total) by mouth daily. 09/02/19  Yes Crecencio Mc, MD  fluticasone (FLONASE) 50 MCG/ACT nasal spray Place 2 sprays into both nostrils daily. 05/07/20  Yes Crecencio Mc, MD    diclofenac (VOLTAREN) 75 MG EC tablet Take 1 tablet (75 mg total) by mouth 2 (two) times daily. Patient not taking: Reported on 06/02/2020 06/07/18   Crecencio Mc, MD  meclizine (ANTIVERT) 12.5 MG tablet TAKE 1 TABLET THREE TIMES DAILY AS NEEDED FOR DIZZINESS Patient not taking: Reported on 06/02/2020 09/27/18   Crecencio Mc, MD    Family History  Problem Relation Age of Onset  . Alcohol abuse Mother   . Arthritis Mother   . Hypertension Mother   . Cancer Father 56       Throat Cancer - died  . Alcohol abuse Father   . Cancer Sister 30       Breast Cancer  . Alcohol abuse Sister      Social History   Tobacco Use  . Smoking status: Former Smoker    Packs/day: 2.00    Years: 21.00    Pack years: 42.00    Types: Cigarettes    Quit date: 1980    Years since quitting: 41.5  . Smokeless tobacco: Never Used  Vaping Use  . Vaping Use: Never used  Substance Use Topics  . Alcohol use: Yes    Comment: Occasional wine glass  . Drug use: No    Allergies as of 06/02/2020  . (No Known Allergies)    Review of Systems:    All systems reviewed and negative except where noted in HPI.   Physical Exam:  There were no vitals taken for this visit. No LMP for male patient. Psych:  Alert and cooperative. Normal mood and affect. General:   Alert,  Well-developed, well-nourished, pleasant and cooperative in NAD Head:  Normocephalic and atraumatic. Eyes:  Sclera clear, no icterus.   Conjunctiva pink. Ears:  Normal auditory acuity. Nose:  No deformity, discharge, or lesions. Mouth:  No deformity or lesions,oropharynx pink & moist. Neck:  Supple; no masses or thyromegaly. Abdomen:  Normal bowel sounds.  No bruits.  Soft, non-tender and non-distended without masses, hepatosplenomegaly or hernias noted.  No guarding or rebound tenderness.    Msk:  Symmetrical without gross deformities. Good, equal movement & strength bilaterally. Pulses:  Normal pulses noted. Extremities:  No clubbing  or edema.  No cyanosis. Neurologic:  Alert and oriented x3;  grossly normal neurologically. Skin:  Intact without significant lesions or rashes. No jaundice. Lymph Nodes:  No significant cervical adenopathy. Psych:  Alert and cooperative. Normal mood and affect.   Labs: CBC    Component Value Date/Time   WBC 8.9 07/10/2018 0000   WBC 6.7 02/15/2018 0928   RBC 4.91 02/15/2018 0928   HGB 15.9 07/10/2018 0000   HGB 14.6 03/13/2014 1505   HCT 47 07/10/2018 0000   HCT 42.5 03/13/2014 1505  PLT 177 07/10/2018 0000   PLT 204 03/13/2014 1505   MCV 92.1 02/15/2018 0928   MCV 93 03/13/2014 1505   MCH 32.1 03/13/2014 1505   MCHC 34.8 02/15/2018 0928   RDW 14.3 02/15/2018 0928   RDW 14.0 03/13/2014 1505   LYMPHSABS 1.2 02/15/2018 0928   LYMPHSABS 1.1 03/13/2014 1505   MONOABS 0.5 02/15/2018 0928   MONOABS 0.6 03/13/2014 1505   EOSABS 0.1 02/15/2018 0928   EOSABS 0.1 03/13/2014 1505   BASOSABS 0.0 02/15/2018 0928   BASOSABS 0.1 03/13/2014 1505   CMP     Component Value Date/Time   NA 140 08/08/2019 0852   NA 140 07/10/2018 0000   NA 140 03/13/2014 1505   K 4.2 08/08/2019 0852   K 3.8 03/13/2014 1505   CL 105 08/08/2019 0852   CL 108 (H) 03/13/2014 1505   CO2 27 08/08/2019 0852   CO2 25 03/13/2014 1505   GLUCOSE 93 08/08/2019 0852   GLUCOSE 153 (H) 03/13/2014 1505   BUN 26 (H) 08/08/2019 0852   BUN 17 07/10/2018 0000   BUN 24 (H) 03/13/2014 1505   CREATININE 1.00 08/08/2019 0852   CREATININE 1.15 03/13/2014 1505   CALCIUM 9.4 08/08/2019 0852   CALCIUM 9.1 03/13/2014 1505   PROT 6.1 08/08/2019 0852   PROT 7.1 03/13/2014 1505   ALBUMIN 4.2 08/08/2019 0852   ALBUMIN 3.8 03/13/2014 1505   AST 20 08/08/2019 0852   AST 21 03/13/2014 1505   ALT 32 08/08/2019 0852   ALT 38 03/13/2014 1505   ALKPHOS 49 08/08/2019 0852   ALKPHOS 53 03/13/2014 1505   BILITOT 1.3 (H) 08/08/2019 0852   BILITOT 1.5 (H) 03/13/2014 1505   GFRNONAA >60 03/13/2014 1505   GFRAA >60 03/13/2014  1505    Imaging Studies: No results found.  Assessment and Plan:   LOVELACE CERVENY is a 79 y.o. y/o male has been referred for history of polyps  His last colonoscopy was in 2018 with prep reported to be good, and withdrawal time over 6 minutes, suggestive of an adequate exam  Recommendations by the endoscopies at the time was a 5-year follow-up, which would be 2023  We discussed the options of proceeding with colonoscopy now due to his history of polyps, versus waiting till 2023.  After discussing risks and benefits in detail, patient prefers to wait till 2023  Patient advised that discussion like today would need to occur at that time given his advanced age and if health allows and patient wishes, colonoscopy can be scheduled at that time.  Guidelines in place today, have a suggested stop date of 80 years of age for screening colonoscopies, but there is no clear-cut guidelines for age of stopping for surveillance colonoscopies.  Therefore, this would need to be determined on a case-by-case basis  Follow-up in clinic in 2 years or earlier if new symptoms occur, which was discussed in detail with patient, or if family history changes  Dr Jamie Zhang  Speech recognition software was used to dictate the above note.

## 2020-06-03 ENCOUNTER — Other Ambulatory Visit: Payer: Self-pay

## 2020-06-03 MED ORDER — FAMOTIDINE 40 MG PO TABS: 40.0000 mg | ORAL_TABLET | Freq: Every day | ORAL | 3 refills | Status: DC

## 2020-07-20 ENCOUNTER — Ambulatory Visit (INDEPENDENT_AMBULATORY_CARE_PROVIDER_SITE_OTHER): Payer: Medicare Other

## 2020-07-20 ENCOUNTER — Other Ambulatory Visit: Payer: Self-pay

## 2020-07-20 ENCOUNTER — Encounter (INDEPENDENT_AMBULATORY_CARE_PROVIDER_SITE_OTHER): Payer: Self-pay | Admitting: Vascular Surgery

## 2020-07-20 ENCOUNTER — Ambulatory Visit (INDEPENDENT_AMBULATORY_CARE_PROVIDER_SITE_OTHER): Payer: Medicare Other | Admitting: Vascular Surgery

## 2020-07-20 VITALS — BP 116/73 | HR 53 | Ht 68.0 in | Wt 206.0 lb

## 2020-07-20 DIAGNOSIS — I714 Abdominal aortic aneurysm, without rupture, unspecified: Secondary | ICD-10-CM

## 2020-07-20 DIAGNOSIS — E781 Pure hyperglyceridemia: Secondary | ICD-10-CM | POA: Diagnosis not present

## 2020-07-20 DIAGNOSIS — I713 Abdominal aortic aneurysm, ruptured, unspecified: Secondary | ICD-10-CM

## 2020-07-20 NOTE — Progress Notes (Signed)
MRN : [id number]  Jamie Zhang is a 79 y.o. ([birth year]) male who presents with chief complaint of  Chief Complaint  Patient presents with  . Follow-up    U/S follow up  .  History of Present Illness: Patient returns today in follow up of his abdominal aortic aneurysm.  He has no complaints today.  He denies any aneurysm related symptoms. Specifically, the patient denies new back or abdominal pain, or signs of peripheral embolization.  His aortic duplex today shows a significant enlargement of his abdominal aortic aneurysm now measuring 4.66 cm in maximal diameter.  This is approximately a centimeter larger than it was a year ago.  Current Outpatient Medications  Medication Sig Dispense Refill  . aspirin EC 81 MG tablet Take 81 mg by mouth daily.    [name] atorvastatin (LIPITOR) 20 MG tablet TAKE 1 TABLET(20 MG) BY MOUTH DAILY 90 tablet 1  . Calcium Citrate-Vitamin D (CITRACAL + D PO) Take 650 mg by mouth daily. 2 tablets daily    . Cholecalciferol (VITAMIN D3) 10000 UNITS TABS Take 1 tablet by mouth daily.    . diclofenac (VOLTAREN) 75 MG EC tablet Take 1 tablet (75 mg total) by mouth 2 (two) times daily. 60 tablet 2  . docusate sodium (STOOL SOFTENER) 100 MG capsule Take 100 mg by mouth 2 (two) times daily.    . famotidine (PEPCID) 40 MG tablet Take 1 tablet (40 mg total) by mouth daily. 90 tablet 3  . fluticasone (FLONASE) 50 MCG/ACT nasal spray Place 2 sprays into both nostrils daily. 48 g 2  . meclizine (ANTIVERT) 12.5 MG tablet TAKE 1 TABLET THREE TIMES DAILY AS NEEDED FOR DIZZINESS 90 tablet 0   No current facility-administered medications for this visit.    Past Medical History:  Diagnosis Date  . Arthritis   . Cataract 01/02/2011   Bilateral - s/p excision  . Colon polyps 2000   Hyperplastic - Repeat colonoscopy 2015  . GERD (gastroesophageal reflux disease)    Pantoprazole  . Hemorrhoids 2006  . HLD (hyperlipidemia)   . Iron deficiency anemia 03/20/2006   Donating too much  blood  . Migraine    No longer has problems  . Osteopenia    on Fosamax  . RLS (restless legs syndrome) 03/20/2006   2/2 iron deficiency  . Schatzki's ring   . Skin cancer 01/04/2012   Basal cell nose, squamous cell scalp - Dr. Ula Lingo    Past Surgical History:  Procedure Laterality Date  . BREAST BIOPSY Left    benign  . Cataract Surgery Bilateral   . COLONOSCOPY WITH PROPOFOL N/A 04/18/2017   Procedure: COLONOSCOPY WITH PROPOFOL;  Surgeon: Robert Bellow, MD;  Location: [facility] ENDOSCOPY;  Service: Endoscopy;  Laterality: N/A;  . INGUINAL HERNIA REPAIR Left   . ROTATOR CUFF REPAIR Left      Social History   Tobacco Use  . Smoking status: Former Smoker    Packs/day: 2.00    Years: 21.00    Pack years: 42.00    Types: Cigarettes    Quit date: 1980    Years since quitting: 41.7  . Smokeless tobacco: Never Used  Vaping Use  . Vaping Use: Never used  Substance Use Topics  . Alcohol use: Yes    Comment: Occasional wine glass  . Drug use: No       Family History  Problem Relation Age of Onset  . Alcohol abuse Mother   . Arthritis Mother   .  Hypertension Mother   . Cancer Father 77       Throat Cancer - died  . Alcohol abuse Father   . Cancer Sister 27       Breast Cancer  . Alcohol abuse Sister      No Known Allergies   REVIEW OF SYSTEMS(Negative unless checked)  Constitutional: [] ???Weight loss[] ???Fever[] ???Chills Cardiac:[] ???Chest pain[] ???Chest pressure[] ???Palpitations [] ???Shortness of breath when laying flat [] ???Shortness of breath at rest [] ???Shortness of breath with exertion. Vascular: [] ???Pain in legs with walking[] ???Pain in legsat rest[] ???Pain in legs when laying flat [] ???Claudication [] ???Pain in feet when walking [] ???Pain in feet at rest [] ???Pain in feet when laying flat [] ???History of DVT [] ???Phlebitis [] ???Swelling in legs [] ???Varicose veins [] ???Non-healing ulcers Pulmonary: [] ???Uses  home oxygen [] ???Productive cough[] ???Hemoptysis [] ???Wheeze [] ???COPD [] ???Asthma Neurologic: [] ???Dizziness [] ???Blackouts [] ???Seizures [] ???History of stroke [] ???History of TIA[] ???Aphasia [] ???Temporary blindness[] ???Dysphagia [] ???Weaknessor numbness in arms [] ???Weakness or numbnessin legs Musculoskeletal: [x] ???Arthritis [] ???Joint swelling [] ???Joint pain [x] ???Low back pain Hematologic:[] ???Easy bruising[] ???Easy bleeding [] ???Hypercoagulable state [] ???Anemic  Gastrointestinal:[] ???Blood in stool[] ???Vomiting blood[x] ???Gastroesophageal reflux/heartburn[] ???Abdominal pain Genitourinary: [] ???Chronic kidney disease [] ???Difficulturination [] ???Frequenturination [] ???Burning with urination[] ???Hematuria Skin: [] ???Rashes [] ???Ulcers [] ???Wounds Psychological: [] ???History of anxiety[] ???History of major depression.   Physical Examination  BP 116/73   Pulse (!) 53   Ht 5\' 8"  (1.727 m)   Wt 206 lb (93.4 kg)   BMI 31.32 kg/m  Gen:  WD/WN, NAD Head: /AT, No temporalis wasting. Ear/Nose/Throat: Hearing grossly intact, nares w/o erythema or drainage Eyes: Conjunctiva clear. Sclera non-icteric Neck: Supple.  Trachea midline Pulmonary:  Good air movement, no use of accessory muscles.  Cardiac: RRR, no JVD Vascular:  Vessel Right Left  Radial Palpable Palpable                          PT Palpable Palpable  DP Palpable Palpable   Gastrointestinal: soft, non-tender/non-distended. No guarding/reflex.  Slightly increased aortic impulse Musculoskeletal: M/S 5/5 throughout.  No deformity or atrophy. No edema. Neurologic: Sensation grossly intact in extremities.  Symmetrical.  Speech is fluent.  Psychiatric: Judgment intact, Mood & affect appropriate for pt's clinical situation. Dermatologic: No rashes or ulcers noted.  No cellulitis or open wounds.       Labs No results found for this or any  previous visit (from the past 2160 hour(s)).  Radiology No results found.  Assessment/Plan HLD (hyperlipidemia) lipid control important in reducing the progression of atherosclerotic disease. Continue statin therapy   Abdominal aortic aneurysm His aortic duplex today shows a significant enlargement of his abdominal aortic aneurysm now measuring 4.66 cm in maximal diameter.  This is approximately a centimeter larger than it was a year ago.  This would represent a very significant enlargement and one that will need to be further evaluated and marked much more closely.  I am going to get a CT scan on the next visit and have that be a short interval follow-up of 3 months.  We discussed the pathophysiology and natural history of aneurysms again today.  He has not smoked in many years and his blood pressure is usually pretty good.   Leotis Pain, MD  07/20/2020 9:26 AM    This note was created with Dragon medical transcription system.  Any errors from dictation are purely unintentional

## 2020-07-20 NOTE — Patient Instructions (Signed)
Abdominal Aortic Aneurysm  An aneurysm is a bulge in one of the blood vessels that carry blood away from the heart (artery). It happens when blood pushes up against a weak or damaged place in the wall of an artery. An abdominal aortic aneurysm happens in the main artery of the body (aorta). Some aneurysms may not cause problems. If it grows, it can burst or tear, causing bleeding inside the body. This is an emergency. It needs to be treated right away. What are the causes? The exact cause of this condition is not known. What increases the risk? The following may make you more likely to get this condition:  Being a male who is 60 years of age or older.  Being white (Caucasian).  Using tobacco.  Having a family history of aneurysms.  Having the following conditions: ? Hardening of the arteries (arteriosclerosis). ? Inflammation of the walls of an artery (arteritis). ? Certain genetic conditions. ? Being very overweight (obesity). ? An infection in the wall of the aorta (infectious aortitis). ? High cholesterol. ? High blood pressure (hypertension). What are the signs or symptoms? Symptoms depend on the size of the aneurysm and how fast it is growing. Most grow slowly and do not cause any symptoms. If symptoms do occur, they may include:  Pain in the belly (abdomen), side, or back.  Feeling full after eating only small amounts of food.  Feeling a throbbing lump in the belly. Symptoms that the aneurysm has burst (ruptured) include:  Sudden, very bad pain in the belly, side, or back.  Feeling sick to your stomach (nauseous).  Throwing up (vomiting).  Feeling light-headed or passing out. How is this treated? Treatment for this condition depends on:  The size of the aneurysm.  How fast it is growing.  Your age.  Your risk of having it burst. If your aneurysm is smaller than 2 inches (5 cm), your doctor may manage it by:  Checking it often to see if it is getting bigger.  You may have an imaging test (ultrasound) to check it every 3-6 months, every year, or every few years.  Giving you medicines to: ? Control blood pressure. ? Treat pain. ? Fight infection. If your aneurysm is larger than 2 inches (5 cm), you may need surgery to fix it. Follow these instructions at home: Lifestyle  Do not use any products that have nicotine or tobacco in them. This includes cigarettes, e-cigarettes, and chewing tobacco. If you need help quitting, ask your doctor.  Get regular exercise. Ask your doctor what types of exercise are best for you. Eating and drinking  Eat a heart-healthy diet. This includes eating plenty of: ? Fresh fruits and vegetables. ? Whole grains. ? Low-fat (lean) protein. ? Low-fat dairy products.  Avoid foods that are high in saturated fat and cholesterol. These foods include red meat and some dairy products.  Do not drink alcohol if: ? Your doctor tells you not to drink. ? You are pregnant, may be pregnant, or are planning to become pregnant.  If you drink alcohol: ? Limit how much you use to:  0-1 drink a day for women.  0-2 drinks a day for men. ? Be aware of how much alcohol is in your drink. In the [place] one drink equals any of these:  One typical bottle of beer (12 oz).  One-half glass of wine (5 oz).  One shot of hard liquor (1 oz). General instructions  Take over-the-counter and prescription medicines only as   told by your doctor.  Keep your blood pressure within normal limits. Ask your doctor what your blood pressure should be.  Have your blood sugar (glucose) level and cholesterol levels checked regularly. Keep your blood sugar level and cholesterol levels within normal limits.  Avoid heavy lifting and activities that take a lot of effort. Ask your doctor what activities are safe for you.  Keep all follow-up visits as told by your doctor. This is important. ? Talk to your doctor about regular screenings to see if the  aneurysm is getting bigger. Contact a doctor if you:  Have pain in your belly, side, or back.  Have a throbbing feeling in your belly.  Have a family history of aneurysms. Get help right away if you:  Have sudden, bad pain in your belly, side, or back.  Feel sick to your stomach.  Throw up.  Have trouble pooping (constipation).  Have trouble peeing (urinating).  Feel light-headed.  Have a fast heart rate when you stand.  Have sweaty skin that is cold to the touch (clammy).  Have shortness of breath.  Have a fever. These symptoms may be an emergency. Do not wait to see if the symptoms will go away. Get medical help right away. Call your local emergency services (911 in the [place] Do not drive yourself to the hospital. Summary  An aneurysm is a bulge in one of the blood vessels that carry blood away from the heart (artery). Some aneurysms may not cause problems.  You may need to have yours checked often. If it grows, it can burst or tear. This causes bleeding inside the body. It needs to be treated right away.  Follow instructions from your doctor about healthy lifestyle changes.  Keep all follow-up visits as told by your doctor. This is important. This information is not intended to replace advice given to you by your health care provider. Make sure you discuss any questions you have with your health care provider. Document Revised: 02/17/2019 Document Reviewed: 06/08/2018 Elsevier Patient Education  2020 [employer]  

## 2020-07-26 ENCOUNTER — Ambulatory Visit (INDEPENDENT_AMBULATORY_CARE_PROVIDER_SITE_OTHER): Payer: Medicare Other | Admitting: Internal Medicine

## 2020-07-26 ENCOUNTER — Other Ambulatory Visit: Payer: Self-pay

## 2020-07-26 ENCOUNTER — Encounter: Payer: Self-pay | Admitting: Internal Medicine

## 2020-07-26 ENCOUNTER — Ambulatory Visit (INDEPENDENT_AMBULATORY_CARE_PROVIDER_SITE_OTHER): Payer: Medicare Other

## 2020-07-26 VITALS — BP 116/72 | HR 57 | Temp 97.9°F | Resp 14 | Ht 68.0 in | Wt 207.4 lb

## 2020-07-26 DIAGNOSIS — G2581 Restless legs syndrome: Secondary | ICD-10-CM | POA: Diagnosis not present

## 2020-07-26 DIAGNOSIS — I700 Atherosclerosis of aorta: Secondary | ICD-10-CM | POA: Diagnosis not present

## 2020-07-26 DIAGNOSIS — M25551 Pain in right hip: Secondary | ICD-10-CM | POA: Diagnosis not present

## 2020-07-26 DIAGNOSIS — I714 Abdominal aortic aneurysm, without rupture, unspecified: Secondary | ICD-10-CM

## 2020-07-26 DIAGNOSIS — R7303 Prediabetes: Secondary | ICD-10-CM | POA: Diagnosis not present

## 2020-07-26 DIAGNOSIS — E781 Pure hyperglyceridemia: Secondary | ICD-10-CM

## 2020-07-26 DIAGNOSIS — G8929 Other chronic pain: Secondary | ICD-10-CM | POA: Diagnosis not present

## 2020-07-26 DIAGNOSIS — M545 Low back pain, unspecified: Secondary | ICD-10-CM

## 2020-07-26 LAB — LIPID PANEL
Cholesterol: 111 mg/dL (ref 0–200)
HDL: 49.7 mg/dL (ref >39.00)
LDL Cholesterol: 44 mg/dL (ref 0–99)
NonHDL: 61.64
Total CHOL/HDL Ratio: 2
Triglycerides: 89 mg/dL (ref 0.0–149.0)
VLDL: 17.8 mg/dL (ref 0.0–40.0)

## 2020-07-26 LAB — COMPREHENSIVE METABOLIC PANEL
ALT: 28 U/L (ref 0–53)
AST: 20 U/L (ref 0–37)
Albumin: 4.6 g/dL (ref 3.5–5.2)
Alkaline Phosphatase: 55 U/L (ref 39–117)
BUN: 23 mg/dL (ref 6–23)
CO2: 29 mEq/L (ref 19–32)
Calcium: 10.1 mg/dL (ref 8.4–10.5)
Chloride: 105 mEq/L (ref 96–112)
Creatinine, Ser: 0.97 mg/dL (ref 0.40–1.50)
GFR: 74.42 mL/min (ref >60.00)
Glucose, Bld: 75 mg/dL (ref 70–99)
Potassium: 4.6 mEq/L (ref 3.5–5.1)
Sodium: 142 mEq/L (ref 135–145)
Total Bilirubin: 1.8 mg/dL — ABNORMAL HIGH (ref 0.2–1.2)
Total Protein: 6.6 g/dL (ref 6.0–8.3)

## 2020-07-26 LAB — HEMOGLOBIN A1C: Hgb A1c MFr Bld: 6 % (ref 4.6–6.5)

## 2020-07-26 LAB — IRON,TIBC AND FERRITIN PANEL
%SAT: 22 % (calc) (ref 20–48)
Ferritin: 26 ng/mL (ref 24–380)
Iron: 96 ug/dL (ref 50–180)
TIBC: 436 mcg/dL (calc) — ABNORMAL HIGH (ref 250–425)

## 2020-07-26 MED ORDER — DICLOFENAC SODIUM 75 MG PO TBEC: 75.0000 mg | DELAYED_RELEASE_TABLET | Freq: Two times a day (BID) | ORAL | 2 refills | Status: DC

## 2020-07-26 MED ORDER — PREDNISONE 10 MG PO TABS: ORAL_TABLET | ORAL | 0 refills | Status: DC

## 2020-07-26 NOTE — Patient Instructions (Signed)
For your hip pain:  Suspend diclofenac while you are taking the prednisone 8 day regimen You can add up to 2000 mg of acetominophen (tylenol) every day safely  In divided doses (500 mg every 6 hours  Or 1000 mg every 12 hours.)  If the pain resolves with prednisone,  No further workup is needed .  If it returns ,  We will have you see Orthopedics

## 2020-07-26 NOTE — Assessment & Plan Note (Signed)
Suspect mild DJD vs bursitis given history and exam.  Plain films ordered  Of hip and lumbar spine . Prednisone taper for 8 days , suspend dlclofenac .  Use tylenol . Referral pending review of x rays and symptoms post prednisone

## 2020-07-26 NOTE — Assessment & Plan Note (Signed)
Recent increase in size per patient by [place].  For CT scan in 3 months. Reviewed role of BP management .  Advised to check BP at hoem weekly to maintaing 120/70 or less

## 2020-07-26 NOTE — Assessment & Plan Note (Signed)
Not tender on exam.  No muscle spasm.

## 2020-07-26 NOTE — Assessment & Plan Note (Signed)
Overdue for Checking iron stores.

## 2020-07-26 NOTE — Assessment & Plan Note (Addendum)
Taking statin.  Goal LDL is 70 or less. Overdue for labs

## 2020-07-26 NOTE — Progress Notes (Signed)
Subjective:  Patient ID: Jamie Zhang, male    DOB: 06-07-1941  Age: 79 y.o. MRN: [id number]  CC: The primary encounter diagnosis was Chronic right hip pain. 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  HPI Jamie Zhang presents for follow up on 2 month history of lower back and hip pain   This visit occurred during the SARS-CoV-2 public health emergency.  Safety protocols were in place, including screening questions prior to the visit, additional usage of staff PPE, and extensive cleaning of exam room while observing appropriate contact time as indicated for disinfecting solutions.   Right hip pain posteriorly located over the SI joint.   Radiates across lower back with certain movements. BEnding over to tie shoes, getting up from chair.  No precipitating event at onset 2 months ago.  Walks on treadmill daily 1-2 miles. Pain became  A lot worse today despite no change in routine.  Has been  taking diclofenac twice daily ,  No tylenol  ,  helping somewat  No prior hip films . Using    Aortic atherosclerosis : reviewed Dr Bunnie Domino note.  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 DEXA scan osteopenia -1.3 in 2019  Outpatient Medications Prior to Visit  Medication Sig Dispense Refill  . aspirin EC 81 MG tablet Take 81 mg by mouth daily.    [name] atorvastatin (LIPITOR) 20 MG tablet TAKE 1 TABLET(20 MG) BY MOUTH DAILY 90 tablet 1  . Calcium Citrate-Vitamin D (CITRACAL + D PO) Take 650 mg by mouth daily. 2 tablets daily    . Cholecalciferol (VITAMIN D3) 10000 UNITS TABS Take 1 tablet by mouth daily.    [name] docusate sodium (STOOL SOFTENER) 100 MG capsule Take 100 mg by mouth 2 (two) times daily.    . famotidine (PEPCID) 40 MG tablet Take 1 tablet (40 mg total) by mouth daily. 90 tablet 3  . fluticasone (FLONASE) 50  MCG/ACT nasal spray Place 2 sprays into both nostrils daily. 48 g 2  . meclizine (ANTIVERT) 12.5 MG tablet TAKE 1 TABLET THREE TIMES DAILY AS NEEDED FOR DIZZINESS 90 tablet 0  . diclofenac (VOLTAREN) 75 MG EC tablet Take 1 tablet (75 mg total) by mouth 2 (two) times daily. 60 tablet 2   No facility-administered medications prior to visit.    Review of Systems;  Patient denies headache, fevers, malaise, unintentional weight loss, skin rash, eye pain, sinus congestion and sinus pain, sore throat, dysphagia,  hemoptysis , cough, dyspnea, wheezing, chest pain, palpitations, orthopnea, edema, abdominal pain, nausea, melena, diarrhea, constipation, flank pain, dysuria, hematuria, urinary  Frequency, nocturia, numbness, tingling, seizures,  Focal weakness, Loss of consciousness,  Tremor, insomnia, depression, anxiety, and suicidal ideation.      Objective:  BP 116/72 (BP Location: Left Arm, Patient Position: Sitting, Cuff Size: Normal)   Pulse (!) 57   Temp 97.9 F (36.6 C) (Oral)   Resp 14   Ht 5\' 8"  (1.727 m)   Wt 207 lb 6.4 oz (94.1 kg)   SpO2 96%   BMI 31.54 kg/m   BP Readings from Last 3 Encounters:  07/26/20 116/72  07/20/20 116/73  12/03/19 120/78    Wt Readings from Last 3 Encounters:  07/26/20 207 lb 6.4 oz (94.1 kg)  07/20/20 206 lb (93.4 kg)  12/03/19 210  lb 12.8 oz (95.6 kg)    General appearance: alert, cooperative and appears stated age Ears: normal TM's and external ear canals both ears Throat: lips, mucosa, and tongue normal; teeth and gums normal Neck: no adenopathy, no carotid bruit, supple, symmetrical, trachea midline and thyroid not enlarged, symmetric, no tenderness/mass/nodules Back: symmetric, no curvature. ROM normal. No CVA tenderness. Lungs: clear to auscultation bilaterally Heart: regular rate and rhythm, S1, S2 normal, no murmur, click, rub or gallop Abdomen: soft, non-tender; bowel sounds normal; no masses,  no organomegaly Pulses: 2+ and  symmetric Skin: Skin color, texture, turgor normal. No rashes or lesions MSK:  Right hip pain increases with flexion and internal rotation  No foot drop , DTRS 1+ patellar  bilaterally Lymph nodes: Cervical, supraclavicular, and axillary nodes normal.  Lab Results  Component Value Date   HGBA1C 5.7 08/08/2019   HGBA1C 5.8 01/24/2019   HGBA1C 5.4 08/19/2018    Lab Results  Component Value Date   CREATININE 1.00 08/08/2019   CREATININE 0.95 01/24/2019   CREATININE 0.9 07/10/2018    Lab Results  Component Value Date   WBC 8.9 07/10/2018   HGB 15.9 07/10/2018   HCT 47 07/10/2018   PLT 177 07/10/2018   GLUCOSE 93 08/08/2019   CHOL 110 08/08/2019   TRIG 79.0 08/08/2019   HDL 51.90 08/08/2019   LDLDIRECT 47.0 12/24/2015   LDLCALC 42 08/08/2019   ALT 32 08/08/2019   AST 20 08/08/2019   NA 140 08/08/2019   K 4.2 08/08/2019   CL 105 08/08/2019   CREATININE 1.00 08/08/2019   BUN 26 (H) 08/08/2019   CO2 27 08/08/2019   TSH 3.21 07/10/2018   PSA 1.19 08/08/2019   HGBA1C 5.7 08/08/2019    No results found.  Assessment & Plan:   Problem List Items Addressed This Visit      Unprioritized   HLD (hyperlipidemia)    Taking statin.  Goal LDL is 70 or less. 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  Plain films ordered  Of hip and lumbar spine . Prednisone taper for 8 days , suspend dlclofenac .  Use tylenol . Referral pending review of x rays and symptoms post prednisone       Relevant Medications   diclofenac (VOLTAREN) 75 MG EC tablet   predniSONE (DELTASONE) 10 MG tablet   Other Relevant Orders   DG Lumbar Spine Complete   DG Hip Unilat W OR W/O Pelvis  2-3 Views Right      I am having Jamie Zhang start on predniSONE. I am also having him maintain his Calcium Citrate-Vitamin D (CITRACAL + D PO), Vitamin D3, aspirin EC, docusate sodium, meclizine, fluticasone, atorvastatin, famotidine, and diclofenac.  Meds ordered this encounter  Medications  . diclofenac (VOLTAREN) 75 MG EC tablet    Sig: Take 1 tablet (75 mg total) by mouth 2 (two) times daily.    Dispense:  60 tablet    Refill:  2    Keep on file for future refills  . predniSONE (DELTASONE) 10 MG tablet    Sig: 6 tablets daily for  3 days, then reduce by 1 tablet daily until gone    Dispense:  33 tablet    Refill:  0    Medications Discontinued During This Encounter  Medication Reason  . diclofenac (VOLTAREN) 75 MG EC tablet Reorder    Follow-up: No follow-ups on file.   Crecencio Mc, MD

## 2020-07-27 ENCOUNTER — Other Ambulatory Visit: Payer: Self-pay | Admitting: Internal Medicine

## 2020-07-27 ENCOUNTER — Other Ambulatory Visit (INDEPENDENT_AMBULATORY_CARE_PROVIDER_SITE_OTHER): Payer: Medicare Other

## 2020-07-27 DIAGNOSIS — R5383 Other fatigue: Secondary | ICD-10-CM | POA: Diagnosis not present

## 2020-07-27 LAB — CBC WITH DIFFERENTIAL/PLATELET
Basophils Absolute: 0.1 10*3/uL (ref 0.0–0.1)
Basophils Relative: 0.8 % (ref 0.0–3.0)
Eosinophils Absolute: 0.1 10*3/uL (ref 0.0–0.7)
Eosinophils Relative: 1.2 % (ref 0.0–5.0)
HCT: 45.1 % (ref 39.0–52.0)
Hemoglobin: 15.2 g/dL (ref 13.0–17.0)
Lymphocytes Relative: 11.9 % — ABNORMAL LOW (ref 12.0–46.0)
Lymphs Abs: 0.9 10*3/uL (ref 0.7–4.0)
MCHC: 33.7 g/dL (ref 30.0–36.0)
MCV: 93.9 fl (ref 78.0–100.0)
Monocytes Absolute: 0.6 10*3/uL (ref 0.1–1.0)
Monocytes Relative: 8.7 % (ref 3.0–12.0)
Neutro Abs: 5.8 10*3/uL (ref 1.4–7.7)
Neutrophils Relative %: 77.4 % — ABNORMAL HIGH (ref 43.0–77.0)
Platelets: 171 10*3/uL (ref 150.0–400.0)
RBC: 4.8 Mil/uL (ref 4.22–5.81)
RDW: 14.4 % (ref 11.5–15.5)
WBC: 7.4 10*3/uL (ref 4.0–10.5)

## 2020-08-06 DIAGNOSIS — Z20828 Contact with and (suspected) exposure to other viral communicable diseases: Secondary | ICD-10-CM | POA: Diagnosis not present

## 2020-08-09 DIAGNOSIS — Z20828 Contact with and (suspected) exposure to other viral communicable diseases: Secondary | ICD-10-CM | POA: Diagnosis not present

## 2020-08-18 DIAGNOSIS — Z23 Encounter for immunization: Secondary | ICD-10-CM | POA: Diagnosis not present

## 2020-10-10 ENCOUNTER — Encounter (INDEPENDENT_AMBULATORY_CARE_PROVIDER_SITE_OTHER): Payer: Self-pay

## 2020-10-10 DIAGNOSIS — H9193 Unspecified hearing loss, bilateral: Secondary | ICD-10-CM

## 2020-10-18 ENCOUNTER — Encounter (INDEPENDENT_AMBULATORY_CARE_PROVIDER_SITE_OTHER): Payer: Self-pay

## 2020-10-20 ENCOUNTER — Ambulatory Visit
Admission: RE | Admit: 2020-10-20 | Discharge: 2020-10-20 | Disposition: A | Discharge status: Home / Self Care | Payer: Medicare Other | Source: Ambulatory Visit | Attending: Vascular Surgery | Admitting: Vascular Surgery

## 2020-10-20 ENCOUNTER — Other Ambulatory Visit: Payer: Self-pay

## 2020-10-20 DIAGNOSIS — I713 Abdominal aortic aneurysm, ruptured, unspecified: Secondary | ICD-10-CM

## 2020-10-20 DIAGNOSIS — I513 Intracardiac thrombosis, not elsewhere classified: Secondary | ICD-10-CM | POA: Diagnosis not present

## 2020-10-20 DIAGNOSIS — Z01818 Encounter for other preprocedural examination: Secondary | ICD-10-CM | POA: Diagnosis not present

## 2020-10-20 DIAGNOSIS — K449 Diaphragmatic hernia without obstruction or gangrene: Secondary | ICD-10-CM | POA: Diagnosis not present

## 2020-10-20 DIAGNOSIS — I714 Abdominal aortic aneurysm, without rupture: Secondary | ICD-10-CM | POA: Diagnosis not present

## 2020-10-20 LAB — POCT I-STAT CREATININE: Creatinine, Ser: 1 mg/dL (ref 0.61–1.24)

## 2020-10-20 MED ORDER — IOHEXOL 350 MG/ML SOLN
100.0000 mL | Freq: Once | INTRAVENOUS | Status: AC | PRN
  Administered 2020-10-20: 100 mL via INTRAVENOUS

## 2020-10-22 ENCOUNTER — Ambulatory Visit (INDEPENDENT_AMBULATORY_CARE_PROVIDER_SITE_OTHER): Payer: Medicare Other | Admitting: Vascular Surgery

## 2020-10-22 ENCOUNTER — Encounter (INDEPENDENT_AMBULATORY_CARE_PROVIDER_SITE_OTHER): Payer: Self-pay | Admitting: Vascular Surgery

## 2020-10-22 ENCOUNTER — Other Ambulatory Visit: Payer: Self-pay

## 2020-10-22 VITALS — BP 135/80 | HR 53 | Ht 68.0 in | Wt 208.0 lb

## 2020-10-22 DIAGNOSIS — I714 Abdominal aortic aneurysm, without rupture, unspecified: Secondary | ICD-10-CM

## 2020-10-22 DIAGNOSIS — E781 Pure hyperglyceridemia: Secondary | ICD-10-CM

## 2020-10-22 NOTE — Patient Instructions (Signed)
Abdominal Aortic Aneurysm Endograft Repair  Abdominal aortic aneurysm endograft repair is a surgery to fix an aortic aneurysm in the abdominal area. An aneurysm is a weak or damaged part of an artery wall that bulges out from the normal force of blood pumping through the body. An abdominal aortic aneurysm is an aneurysm that happens in the lower part of the aorta, which is the main artery of the body. The repair is often done if the aneurysm gets so large that it might burst (rupture). A ruptured aneurysm would cause bleeding inside the body that could put a person's life in danger. Before that happens, this procedure is needed to fix the problem. The procedure may also be done if the aneurysm causes symptoms such as pain in the back, abdomen, or side. In this procedure, a tube made of fabric and metal mesh (endograft or stent-graft) is placed in the weak part of the aorta to repair it. Tell a health care provider about:  Any allergies you have.  All medicines you are taking, including vitamins, herbs, eye drops, creams, and over-the-counter medicines.  Any problems you or family members have had with anesthetic medicines.  Any blood disorders you have.  Any surgeries you have had.  Any medical conditions you have.  Whether you are pregnant or may be pregnant. What are the risks? Generally, this is a safe procedure. However, problems may occur, including:  Infection of the graft or incision area.  Bleeding during the procedure or from the incision site.  Allergic reactions to medicines.  Damage to other structures or organs.  Blood leaking out around the endograft.  The endograft moving from where it was placed during surgery.  Blood flow through the graft becoming blocked.  Blood clots.  Kidney problems.  Blood flow to the legs becoming blocked (rare).  Rupture of the aorta even after the endograft repair is a success (rare). What happens before the procedure? Staying  hydrated Follow instructions from your health care provider about hydration, which may include:  Up to 2 hours before the procedure - you may continue to drink clear liquids, such as water, clear fruit juice, black coffee, and plain tea. Eating and drinking restrictions Follow instructions from your health care provider about eating and drinking, which may include:  8 hours before the procedure - stop eating heavy meals or foods such as meat, fried foods, or fatty foods.  6 hours before the procedure - stop eating light meals or foods, such as toast or cereal.  6 hours before the procedure - stop drinking milk or drinks that contain milk.  2 hours before the procedure - stop drinking clear liquids. Medicines  Ask your health care provider about: ? Changing or stopping your regular medicines. This is especially important if you are taking diabetes medicines or blood thinners. ? Taking medicines such as aspirin and ibuprofen. These medicines can thin your blood. Do not take these medicines before your procedure if your health care provider instructs you not to.  You may be given antibiotic medicine to help prevent infection. General instructions  You may need to have blood tests, a test to check heart rhythm (electrocardiogram, or ECG), or a test to check blood flow (angiogram) before the surgery.  Imaging tests will be done to check the size and location of the aneurysm. These tests could include an ultrasound, a CT scan, or an MRI.  Do not use any products that contain nicotine or tobacco--such as cigarettes and e-cigarettes--for as   long as possible before the surgery. If you need help quitting, ask your health care provider.  Ask your health care provider how your surgical site will be marked or identified.  Plan to have someone take you home from the hospital or clinic. What happens during the procedure?  To reduce your risk of infection: ? Your health care team will wash or  sanitize their hands. ? Your skin will be washed with soap. ? Hair may be removed from the surgical area.  An IV tube will be inserted into one of your veins.  You will be given one or more of the following: ? A medicine to help you relax (sedative). ? A medicine to numb the area (local anesthetic). ? A medicine to make you fall asleep (general anesthetic). ? A medicine that is injected into an area of your body to numb everything below the injection site (regional anesthetic).  During the surgery: ? Small incisions or a puncture will be made on one or both sides of the groin. Long, thin tubes (catheters) will be passed through the opening, put into the artery in your thigh, and moved up into the aneurysm in the aorta. ? The health care provider will use live X-ray pictures to guide the endograft through the catheterto the place where the aneurysm is. ? The endograft will be released to seal off the aneurysm and to line the aorta. It will keep blood from flowing into the aneurysm and will help keep it from rupturing. The endograft will stay in place and will not be taken out. ? X-rays will be used to check where the endograft is placed and to make sure that it is where it should be. ? The catheter will be taken out, and the incision will be closed with stitches (sutures). The procedure may vary among health care providers and hospitals. What happens after the procedure?  Your blood pressure, heart rate, breathing rate, and blood oxygen level will be monitored until the medicines you were given have worn off.  You will need to lie flat for a number of hours. Bending your legs can cause them to bleed and swell.  You will then be urged to get up and move around a number of times each day and to slowly become more active.  You will be given medicines to control pain.  Certain tests may be done after your procedure to check how well the endograft is working and to check its placement.  Do  not drive for 24 hours if you received a sedative. This information is not intended to replace advice given to you by your health care provider. Make sure you discuss any questions you have with your health care provider. Document Revised: 10/12/2017 Document Reviewed: 01/24/2016 Elsevier Patient Education  2020 [employer]  

## 2020-10-22 NOTE — H&P (View-Only) (Signed)
MRN : [id number]  Jamie Zhang is a 79 y.o. (1941-08-19) male who presents with chief complaint of  Chief Complaint  Patient presents with  . AAA  . Follow-up    CT results  .  History of Present Illness: Patient returns today in follow up of his abdominal aortic aneurysm.  This has shown steady growth and at his last visit we planned a CT scan of the abdomen and pelvis.  His aneurysm at his last visit 3 months ago was 4.66 cm. I have independently reviewed his CT angiogram today.  This demonstrates a 4.9 cm infrarenal abdominal aortic aneurysm although his lower right renal artery comes off the midportion of the aneurysm.  This would not be salvageable with endovascular repair and unlikely to be salvageable with open repair either.  He has mild iliac artery aneurysms that can be treated with stent graft as well.  Current Outpatient Medications  Medication Sig Dispense Refill  . aspirin EC 81 MG tablet Take 81 mg by mouth daily.    [name] atorvastatin (LIPITOR) 20 MG tablet TAKE 1 TABLET(20 MG) BY MOUTH DAILY 90 tablet 1  . Calcium Citrate-Vitamin D (CITRACAL + D PO) Take 650 mg by mouth daily. 2 tablets daily    . Cholecalciferol (VITAMIN D3) 10000 UNITS TABS Take 1 tablet by mouth daily.    . diclofenac (VOLTAREN) 75 MG EC tablet Take 1 tablet (75 mg total) by mouth 2 (two) times daily. 60 tablet 2  . famotidine (PEPCID) 40 MG tablet Take 1 tablet (40 mg total) by mouth daily. 90 tablet 3  . fluticasone (FLONASE) 50 MCG/ACT nasal spray Place 2 sprays into both nostrils daily. 48 g 2  . meclizine (ANTIVERT) 12.5 MG tablet TAKE 1 TABLET THREE TIMES DAILY AS NEEDED FOR DIZZINESS 90 tablet 0  . docusate sodium (STOOL SOFTENER) 100 MG capsule Take 100 mg by mouth 2 (two) times daily.    . predniSONE (DELTASONE) 10 MG tablet 6 tablets daily for 3 days, then reduce by 1 tablet daily until gone 33 tablet 0   No current facility-administered medications for this visit.    Past Medical History:   Diagnosis Date  . Arthritis   . Cataract 01/02/2011   Bilateral - s/p excision  . Colon polyps 2000   Hyperplastic - Repeat colonoscopy 2015  . GERD (gastroesophageal reflux disease)    Pantoprazole  . Hemorrhoids 2006  . HLD (hyperlipidemia)   . Iron deficiency anemia 03/20/2006   Donating too much blood  . Migraine    No longer has problems  . Osteopenia    on Fosamax  . RLS (restless legs syndrome) 03/20/2006   2/2 iron deficiency  . Schatzki's ring   . Skin cancer 01/04/2012   Basal cell nose, squamous cell scalp - Dr. Ula Lingo    Past Surgical History:  Procedure Laterality Date  . BREAST BIOPSY Left    benign  . Cataract Surgery Bilateral   . COLONOSCOPY WITH PROPOFOL N/A 04/18/2017   Procedure: COLONOSCOPY WITH PROPOFOL;  Surgeon: Robert Bellow, MD;  Location: [facility] ENDOSCOPY;  Service: Endoscopy;  Laterality: N/A;  . INGUINAL HERNIA REPAIR Left   . ROTATOR CUFF REPAIR Left      Social History   Tobacco Use  . Smoking status: Former Smoker    Packs/day: 2.00    Years: 21.00    Pack years: 42.00    Types: Cigarettes    Quit date: 1980    Years since  quitting: 41.9  . Smokeless tobacco: Never Used  Vaping Use  . Vaping Use: Never used  Substance Use Topics  . Alcohol use: Yes    Comment: Occasional wine glass  . Drug use: No      Family History  Problem Relation Age of Onset  . Alcohol abuse Mother   . Arthritis Mother   . Hypertension Mother   . Cancer Father 69       Throat Cancer - died  . Alcohol abuse Father   . Cancer Sister 32       Breast Cancer  . Alcohol abuse Sister      No Known Allergies  REVIEW OF SYSTEMS(Negative unless checked)  Constitutional: [] ????Weight loss[] ????Fever[] ????Chills Cardiac:[] ????Chest pain[] ????Chest pressure[] ????Palpitations [] ????Shortness of breath when laying flat [] ????Shortness of breath at rest [] ????Shortness of breath with exertion. Vascular: [] ????Pain in legs with  walking[] ????Pain in legsat rest[] ????Pain in legs when laying flat [] ????Claudication [] ????Pain in feet when walking [] ????Pain in feet at rest [] ????Pain in feet when laying flat [] ????History of DVT [] ????Phlebitis [] ????Swelling in legs [] ????Varicose veins [] ????Non-healing ulcers Pulmonary: [] ????Uses home oxygen [] ????Productive cough[] ????Hemoptysis [] ????Wheeze [] ????COPD [] ????Asthma Neurologic: [] ????Dizziness [] ????Blackouts [] ????Seizures [] ????History of stroke [] ????History of TIA[] ????Aphasia [] ????Temporary blindness[] ????Dysphagia [] ????Weaknessor numbness in arms [] ????Weakness or numbnessin legs Musculoskeletal: [x] ????Arthritis [] ????Joint swelling [] ????Joint pain [x] ????Low back pain Hematologic:[] ????Easy bruising[] ????Easy bleeding [] ????Hypercoagulable state [] ????Anemic  Gastrointestinal:[] ????Blood in stool[] ????Vomiting blood[x] ????Gastroesophageal reflux/heartburn[] ????Abdominal pain Genitourinary: [] ????Chronic kidney disease [] ????Difficulturination [] ????Frequenturination [] ????Burning with urination[] ????Hematuria Skin: [] ????Rashes [] ????Ulcers [] ????Wounds Psychological: [] ????History of anxiety[] ????History of major depression.  Physical Examination  BP 135/80   Pulse (!) 53   Ht 5\' 8"  (1.727 m)   Wt 208 lb (94.3 kg)   BMI 31.63 kg/m  Gen:  WD/WN, NAD.  Appears younger than stated age Head: [place]/AT, No temporalis wasting. Ear/Nose/Throat: Hearing grossly intact, nares w/o erythema or drainage Eyes: Conjunctiva clear. Sclera non-icteric Neck: Supple.  Trachea midline Pulmonary:  Good air movement, no use of accessory muscles.  Cardiac: RRR, no JVD Vascular:  Vessel Right Left  Radial Palpable Palpable                          PT Palpable Palpable  DP Palpable Palpable   Gastrointestinal: soft, non-tender/non-distended. No guarding/reflex.   Increased aortic impulse Musculoskeletal: M/S 5/5 throughout.  No deformity or atrophy.  No edema. Neurologic: Sensation grossly intact in extremities.  Symmetrical.  Speech is fluent.  Psychiatric: Judgment intact, Mood & affect appropriate for pt's clinical situation. Dermatologic: No rashes or ulcers noted.  No cellulitis or open wounds.       Labs Recent Results (from the past 2160 hour(s))  Comprehensive metabolic panel     Status: Abnormal   Collection Time: 07/26/20 11:30 AM  Result Value Ref Range   Sodium 142 135 - 145 mEq/L   Potassium 4.6 3.5 - 5.1 mEq/L   Chloride 105 96 - 112 mEq/L   CO2 29 19 - 32 mEq/L   Glucose, Bld 75 70 - 99 mg/dL   BUN 23 6 - 23 mg/dL   Creatinine, Ser 0.97 0.40 - 1.50 mg/dL   Total Bilirubin 1.8 (H) 0.2 - 1.2 mg/dL   Alkaline Phosphatase 55 39 - 117 U/L   AST 20 0 - 37 U/L   ALT 28 0 - 53 U/L   Total Protein 6.6 6.0 - 8.3 g/dL   Albumin 4.6 3.5 - 5.2 g/dL   GFR 74.42 >60.00 mL/min   Calcium 10.1 8.4 - 10.5 mg/dL  Lipid  panel     Status: None   Collection Time: 07/26/20 11:30 AM  Result Value Ref Range   Cholesterol 111 0 - 200 mg/dL    Comment: ATP III Classification       Desirable:  < 200 mg/dL               Borderline High:  200 - 239 mg/dL          High:  > = 240 mg/dL   Triglycerides 89.0 0.0 - 149.0 mg/dL    Comment: Normal:  <150 mg/dLBorderline High:  150 - 199 mg/dL   HDL 49.70 >39.00 mg/dL   VLDL 17.8 0.0 - 40.0 mg/dL   LDL Cholesterol 44 0 - 99 mg/dL   Total CHOL/HDL Ratio 2     Comment:                Men          Women1/2 Average Risk     3.4          3.3Average Risk          5.0          4.42X Average Risk          9.6          7.13X Average Risk          15.0          11.0                       NonHDL 61.64     Comment: NOTE:  Non-HDL goal should be 30 mg/dL higher than patient's LDL goal (i.e. LDL goal of < 70 mg/dL, would have non-HDL goal of < 100 mg/dL)  Hemoglobin A1c     Status: None   Collection Time: 07/26/20  11:30 AM  Result Value Ref Range   Hgb A1c MFr Bld 6.0 4.6 - 6.5 %    Comment: Glycemic Control Guidelines for People with Diabetes:Non Diabetic:  <6%Goal of Therapy: <7%Additional Action Suggested:  >8%   Iron, TIBC and Ferritin Panel     Status: Abnormal   Collection Time: 07/26/20 11:30 AM  Result Value Ref Range   Iron 96 50 - 180 mcg/dL   TIBC 436 (H) 250 - 425 mcg/dL (calc)   %SAT 22 20 - 48 % (calc)   Ferritin 26 24 - 380 ng/mL  CBC with Differential/Platelet     Status: Abnormal   Collection Time: 07/27/20 11:15 AM  Result Value Ref Range   WBC 7.4 4.0 - 10.5 K/uL   RBC 4.80 4.22 - 5.81 Mil/uL   Hemoglobin 15.2 13.0 - 17.0 g/dL   HCT 45.1 39.0 - 52.0 %   MCV 93.9 78.0 - 100.0 fl   MCHC 33.7 30.0 - 36.0 g/dL   RDW 14.4 11.5 - 15.5 %   Platelets 171.0 150.0 - 400.0 K/uL   Neutrophils Relative % 77.4 (H) 43.0 - 77.0 %   Lymphocytes Relative 11.9 (L) 12.0 - 46.0 %   Monocytes Relative 8.7 3.0 - 12.0 %   Eosinophils Relative 1.2 0.0 - 5.0 %   Basophils Relative 0.8 0.0 - 3.0 %   Neutro Abs 5.8 1.4 - 7.7 K/uL   Lymphs Abs 0.9 0.7 - 4.0 K/uL   Monocytes Absolute 0.6 0.1 - 1.0 K/uL   Eosinophils Absolute 0.1 0.0 - 0.7 K/uL   Basophils Absolute 0.1 0.0 - 0.1 K/uL  I-STAT creatinine  Status: None   Collection Time: 10/20/20  8:09 AM  Result Value Ref Range   Creatinine, Ser 1.00 0.61 - 1.24 mg/dL    Radiology CT Angio Abd/Pel w/ and/or w/o  Result Date: 10/20/2020 CLINICAL DATA:  79 year old with abdominal aortic aneurysm. Preoperative planning. EXAM: CTA ABDOMEN AND PELVIS WITHOUT AND WITH CONTRAST TECHNIQUE: Multidetector CT imaging of the abdomen and pelvis was performed using the standard protocol during bolus administration of intravenous contrast. Multiplanar reconstructed images and MIPs were obtained and reviewed to evaluate the vascular anatomy. CONTRAST:  148mL OMNIPAQUE IOHEXOL 350 MG/ML SOLN COMPARISON:  11/11/2015 FINDINGS: VASCULAR Aorta: Distal descending  thoracic aorta measures 3.0 cm and stable. Supra celiac abdominal aorta measures 3.3 cm in transverse dimension and stable. Juxtarenal abdominal aorta measures 2.8 cm in the AP dimension and stable. Again noted is extensive atherosclerotic disease involving the infrarenal abdominal aorta with irregular mural thrombus and evidence for intimal webs. Maximum dimension of the abdominal aorta on sequence 4, image 93 measures up to 4.9 cm and measured up to 4.0 cm in 2016. Irregular mural thrombus and intimal webs appear to be similar to the previous examination. Celiac: Patent without evidence of aneurysm, dissection, vasculitis or significant stenosis. SMA: Replaced right hepatic artery. SMA is patent without significant stenosis. No evidence for aneurysm or dissection. Renals: There are 2 right renal arteries that are patent without significant stenosis and no evidence for aneurysm or dissection. Both of these right renal arteries are similar in size. The more inferior renal artery is just proximal to the IMA takeoff. Single left renal artery is patent without significant stenosis. No evidence for aneurysm or dissection involving the left renal artery. IMA: Patent without evidence of aneurysm, dissection, vasculitis or significant stenosis. Inflow: Right common iliac artery measures up to 1.9 cm and stable in size. Again noted is an intimal flap or intraluminal web in the right common iliac artery. Again noted is intimal flap or web in the left common iliac artery. Left common iliac artery measures up to 1.8 cm and previously measured 1.6 cm. Bilateral external and internal iliac arteries are patent without significant stenosis or aneurysm formation. Proximal Outflow: Proximal femoral arteries are patent bilaterally. Veins: Portal venous system is patent. IVC and renal veins are patent. Contrast refluxes into the hepatic veins. Review of the MIP images confirms the above findings. NON-VASCULAR Lower chest: Punctate  calcified granuloma in the right lower lobe. No pleural effusions. No significant consolidation at the lung bases. Hepatobiliary: Normal appearance of the liver and gallbladder. No biliary dilatation. Pancreas: Unremarkable. No pancreatic ductal dilatation or surrounding inflammatory changes. Spleen: Normal in size without focal abnormality. Adrenals/Urinary Tract: Normal appearance of the adrenal glands. A tiny hypodensity in left kidney lower pole is suggestive for small cyst. No suspicious renal lesions. No hydronephrosis. A small amount of fluid in the urinary bladder. Slightly decreased enhancement in the right kidney compared to the left on the arterial phase imaging but enhancement is similar on the delayed images. Stomach/Bowel: Moderate to large sized hiatal hernia. High-density material in the appendix without inflammatory changes. No focal bowel inflammation. No evidence for bowel obstruction. Lymphatic: No lymph node enlargement in the abdomen or pelvis. Reproductive: Prostate is prominent for size measuring up to 5.4 cm. Other: Negative for ascites. Musculoskeletal: No acute bone abnormality. IMPRESSION: VASCULAR 1. Abdominal aortic aneurysm has enlarged since 2016. The infrarenal abdominal aortic aneurysm now measures up to 4.9 cm and previously measured up to 4.0 cm. Again noted is marked  atherosclerotic disease with irregular mural thrombus and vascular webs involving the distal abdominal aorta and the common iliac arteries bilaterally. Recommend follow-up CT/MR every 6 months and vascular consultation. This recommendation follows ACR consensus guidelines: White Paper of the ACR Incidental Findings Committee II on Vascular Findings. J Am Coll Radiol 2013; 10:789-794. 2. Enlargement of bilateral common iliac arteries. Right common iliac artery is measures up to 1.9 cm and the left measures up to 1.8 cm. Common iliac arteries have not significantly changed in size since 2016. 3. Two right renal  arteries as described. NON-VASCULAR 1. Moderate to large sized hiatal hernia. 2. No acute abnormality in the abdomen or pelvis. 3. Prostate enlargement. Electronically Signed   By: Markus Daft M.D.   On: 10/20/2020 11:55    Assessment/Plan HLD (hyperlipidemia) lipid control important in reducing the progression of atherosclerotic disease. Continue statin therapy   Abdominal aortic aneurysm I have independently reviewed his CT angiogram today.  This demonstrates a 4.9 cm infrarenal abdominal aortic aneurysm although his lower right renal artery comes off the midportion of the aneurysm.  This would not be salvageable with endovascular repair and unlikely to be salvageable with open repair either.  He has mild iliac artery aneurysms that can be treated with stent graft as well. We had a long discussion today regarding treatment options.  Clearly the less morbid option would be an endovascular repair although the lower right renal artery will need to be coiled/covered.  He will lose a portion of the kidney, but with normal renal function he should do well.  He is otherwise reasonably healthy and does not have diabetes or severe hypertension.  He and his wife voiced their understanding and desire to proceed with endovascular repair. No problem-specific Assessment & Plan notes found for this encounter.    Leotis Pain, MD  10/22/2020 12:08 PM    This note was created with Dragon medical transcription system.  Any errors from dictation are purely unintentional

## 2020-10-22 NOTE — Progress Notes (Signed)
MRN : [id number]  Jamie Zhang is a 79 y.o. (12-08-1940) male who presents with chief complaint of  Chief Complaint  Patient presents with  . AAA  . Follow-up    CT results  .  History of Present Illness: Patient returns today in follow up of his abdominal aortic aneurysm.  This has shown steady growth and at his last visit we planned a CT scan of the abdomen and pelvis.  His aneurysm at his last visit 3 months ago was 4.66 cm. I have independently reviewed his CT angiogram today.  This demonstrates a 4.9 cm infrarenal abdominal aortic aneurysm although his lower right renal artery comes off the midportion of the aneurysm.  This would not be salvageable with endovascular repair and unlikely to be salvageable with open repair either.  He has mild iliac artery aneurysms that can be treated with stent graft as well.  Current Outpatient Medications  Medication Sig Dispense Refill  . aspirin EC 81 MG tablet Take 81 mg by mouth daily.    [name] atorvastatin (LIPITOR) 20 MG tablet TAKE 1 TABLET(20 MG) BY MOUTH DAILY 90 tablet 1  . Calcium Citrate-Vitamin D (CITRACAL + D PO) Take 650 mg by mouth daily. 2 tablets daily    . Cholecalciferol (VITAMIN D3) 10000 UNITS TABS Take 1 tablet by mouth daily.    . diclofenac (VOLTAREN) 75 MG EC tablet Take 1 tablet (75 mg total) by mouth 2 (two) times daily. 60 tablet 2  . famotidine (PEPCID) 40 MG tablet Take 1 tablet (40 mg total) by mouth daily. 90 tablet 3  . fluticasone (FLONASE) 50 MCG/ACT nasal spray Place 2 sprays into both nostrils daily. 48 g 2  . meclizine (ANTIVERT) 12.5 MG tablet TAKE 1 TABLET THREE TIMES DAILY AS NEEDED FOR DIZZINESS 90 tablet 0  . docusate sodium (STOOL SOFTENER) 100 MG capsule Take 100 mg by mouth 2 (two) times daily.    . predniSONE (DELTASONE) 10 MG tablet 6 tablets daily for 3 days, then reduce by 1 tablet daily until gone 33 tablet 0   No current facility-administered medications for this visit.    Past Medical History:   Diagnosis Date  . Arthritis   . Cataract 01/02/2011   Bilateral - s/p excision  . Colon polyps 2000   Hyperplastic - Repeat colonoscopy 2015  . GERD (gastroesophageal reflux disease)    Pantoprazole  . Hemorrhoids 2006  . HLD (hyperlipidemia)   . Iron deficiency anemia 03/20/2006   Donating too much blood  . Migraine    No longer has problems  . Osteopenia    on Fosamax  . RLS (restless legs syndrome) 03/20/2006   2/2 iron deficiency  . Schatzki's ring   . Skin cancer 01/04/2012   Basal cell nose, squamous cell scalp - Dr. Ula Lingo    Past Surgical History:  Procedure Laterality Date  . BREAST BIOPSY Left    benign  . Cataract Surgery Bilateral   . COLONOSCOPY WITH PROPOFOL N/A 04/18/2017   Procedure: COLONOSCOPY WITH PROPOFOL;  Surgeon: Robert Bellow, MD;  Location: [facility] ENDOSCOPY;  Service: Endoscopy;  Laterality: N/A;  . INGUINAL HERNIA REPAIR Left   . ROTATOR CUFF REPAIR Left      Social History   Tobacco Use  . Smoking status: Former Smoker    Packs/day: 2.00    Years: 21.00    Pack years: 42.00    Types: Cigarettes    Quit date: 1980    Years since  quitting: 41.9  . Smokeless tobacco: Never Used  Vaping Use  . Vaping Use: Never used  Substance Use Topics  . Alcohol use: Yes    Comment: Occasional wine glass  . Drug use: No      Family History  Problem Relation Age of Onset  . Alcohol abuse Mother   . Arthritis Mother   . Hypertension Mother   . Cancer Father 49       Throat Cancer - died  . Alcohol abuse Father   . Cancer Sister 53       Breast Cancer  . Alcohol abuse Sister      No Known Allergies  REVIEW OF SYSTEMS(Negative unless checked)  Constitutional: [] ????Weight loss[] ????Fever[] ????Chills Cardiac:[] ????Chest pain[] ????Chest pressure[] ????Palpitations [] ????Shortness of breath when laying flat [] ????Shortness of breath at rest [] ????Shortness of breath with exertion. Vascular: [] ????Pain in legs with  walking[] ????Pain in legsat rest[] ????Pain in legs when laying flat [] ????Claudication [] ????Pain in feet when walking [] ????Pain in feet at rest [] ????Pain in feet when laying flat [] ????History of DVT [] ????Phlebitis [] ????Swelling in legs [] ????Varicose veins [] ????Non-healing ulcers Pulmonary: [] ????Uses home oxygen [] ????Productive cough[] ????Hemoptysis [] ????Wheeze [] ????COPD [] ????Asthma Neurologic: [] ????Dizziness [] ????Blackouts [] ????Seizures [] ????History of stroke [] ????History of TIA[] ????Aphasia [] ????Temporary blindness[] ????Dysphagia [] ????Weaknessor numbness in arms [] ????Weakness or numbnessin legs Musculoskeletal: [x] ????Arthritis [] ????Joint swelling [] ????Joint pain [x] ????Low back pain Hematologic:[] ????Easy bruising[] ????Easy bleeding [] ????Hypercoagulable state [] ????Anemic  Gastrointestinal:[] ????Blood in stool[] ????Vomiting blood[x] ????Gastroesophageal reflux/heartburn[] ????Abdominal pain Genitourinary: [] ????Chronic kidney disease [] ????Difficulturination [] ????Frequenturination [] ????Burning with urination[] ????Hematuria Skin: [] ????Rashes [] ????Ulcers [] ????Wounds Psychological: [] ????History of anxiety[] ????History of major depression.  Physical Examination  BP 135/80   Pulse (!) 53   Ht 5\' 8"  (1.727 m)   Wt 208 lb (94.3 kg)   BMI 31.63 kg/m  Gen:  WD/WN, NAD.  Appears younger than stated age Head: [place]/AT, No temporalis wasting. Ear/Nose/Throat: Hearing grossly intact, nares w/o erythema or drainage Eyes: Conjunctiva clear. Sclera non-icteric Neck: Supple.  Trachea midline Pulmonary:  Good air movement, no use of accessory muscles.  Cardiac: RRR, no JVD Vascular:  Vessel Right Left  Radial Palpable Palpable                          PT Palpable Palpable  DP Palpable Palpable   Gastrointestinal: soft, non-tender/non-distended. No guarding/reflex.   Increased aortic impulse Musculoskeletal: M/S 5/5 throughout.  No deformity or atrophy.  No edema. Neurologic: Sensation grossly intact in extremities.  Symmetrical.  Speech is fluent.  Psychiatric: Judgment intact, Mood & affect appropriate for pt's clinical situation. Dermatologic: No rashes or ulcers noted.  No cellulitis or open wounds.       Labs Recent Results (from the past 2160 hour(s))  Comprehensive metabolic panel     Status: Abnormal   Collection Time: 07/26/20 11:30 AM  Result Value Ref Range   Sodium 142 135 - 145 mEq/L   Potassium 4.6 3.5 - 5.1 mEq/L   Chloride 105 96 - 112 mEq/L   CO2 29 19 - 32 mEq/L   Glucose, Bld 75 70 - 99 mg/dL   BUN 23 6 - 23 mg/dL   Creatinine, Ser 0.97 0.40 - 1.50 mg/dL   Total Bilirubin 1.8 (H) 0.2 - 1.2 mg/dL   Alkaline Phosphatase 55 39 - 117 U/L   AST 20 0 - 37 U/L   ALT 28 0 - 53 U/L   Total Protein 6.6 6.0 - 8.3 g/dL   Albumin 4.6 3.5 - 5.2 g/dL   GFR 74.42 >60.00 mL/min   Calcium 10.1 8.4 - 10.5 mg/dL  Lipid  panel     Status: None   Collection Time: 07/26/20 11:30 AM  Result Value Ref Range   Cholesterol 111 0 - 200 mg/dL    Comment: ATP III Classification       Desirable:  < 200 mg/dL               Borderline High:  200 - 239 mg/dL          High:  > = 240 mg/dL   Triglycerides 89.0 0.0 - 149.0 mg/dL    Comment: Normal:  <150 mg/dLBorderline High:  150 - 199 mg/dL   HDL 49.70 >39.00 mg/dL   VLDL 17.8 0.0 - 40.0 mg/dL   LDL Cholesterol 44 0 - 99 mg/dL   Total CHOL/HDL Ratio 2     Comment:                Men          Women1/2 Average Risk     3.4          3.3Average Risk          5.0          4.42X Average Risk          9.6          7.13X Average Risk          15.0          11.0                       NonHDL 61.64     Comment: NOTE:  Non-HDL goal should be 30 mg/dL higher than patient's LDL goal (i.e. LDL goal of < 70 mg/dL, would have non-HDL goal of < 100 mg/dL)  Hemoglobin A1c     Status: None   Collection Time: 07/26/20  11:30 AM  Result Value Ref Range   Hgb A1c MFr Bld 6.0 4.6 - 6.5 %    Comment: Glycemic Control Guidelines for People with Diabetes:Non Diabetic:  <6%Goal of Therapy: <7%Additional Action Suggested:  >8%   Iron, TIBC and Ferritin Panel     Status: Abnormal   Collection Time: 07/26/20 11:30 AM  Result Value Ref Range   Iron 96 50 - 180 mcg/dL   TIBC 436 (H) 250 - 425 mcg/dL (calc)   %SAT 22 20 - 48 % (calc)   Ferritin 26 24 - 380 ng/mL  CBC with Differential/Platelet     Status: Abnormal   Collection Time: 07/27/20 11:15 AM  Result Value Ref Range   WBC 7.4 4.0 - 10.5 K/uL   RBC 4.80 4.22 - 5.81 Mil/uL   Hemoglobin 15.2 13.0 - 17.0 g/dL   HCT 45.1 39.0 - 52.0 %   MCV 93.9 78.0 - 100.0 fl   MCHC 33.7 30.0 - 36.0 g/dL   RDW 14.4 11.5 - 15.5 %   Platelets 171.0 150.0 - 400.0 K/uL   Neutrophils Relative % 77.4 (H) 43.0 - 77.0 %   Lymphocytes Relative 11.9 (L) 12.0 - 46.0 %   Monocytes Relative 8.7 3.0 - 12.0 %   Eosinophils Relative 1.2 0.0 - 5.0 %   Basophils Relative 0.8 0.0 - 3.0 %   Neutro Abs 5.8 1.4 - 7.7 K/uL   Lymphs Abs 0.9 0.7 - 4.0 K/uL   Monocytes Absolute 0.6 0.1 - 1.0 K/uL   Eosinophils Absolute 0.1 0.0 - 0.7 K/uL   Basophils Absolute 0.1 0.0 - 0.1 K/uL  I-STAT creatinine  Status: None   Collection Time: 10/20/20  8:09 AM  Result Value Ref Range   Creatinine, Ser 1.00 0.61 - 1.24 mg/dL    Radiology CT Angio Abd/Pel w/ and/or w/o  Result Date: 10/20/2020 CLINICAL DATA:  79 year old with abdominal aortic aneurysm. Preoperative planning. EXAM: CTA ABDOMEN AND PELVIS WITHOUT AND WITH CONTRAST TECHNIQUE: Multidetector CT imaging of the abdomen and pelvis was performed using the standard protocol during bolus administration of intravenous contrast. Multiplanar reconstructed images and MIPs were obtained and reviewed to evaluate the vascular anatomy. CONTRAST:  142mL OMNIPAQUE IOHEXOL 350 MG/ML SOLN COMPARISON:  11/11/2015 FINDINGS: VASCULAR Aorta: Distal descending  thoracic aorta measures 3.0 cm and stable. Supra celiac abdominal aorta measures 3.3 cm in transverse dimension and stable. Juxtarenal abdominal aorta measures 2.8 cm in the AP dimension and stable. Again noted is extensive atherosclerotic disease involving the infrarenal abdominal aorta with irregular mural thrombus and evidence for intimal webs. Maximum dimension of the abdominal aorta on sequence 4, image 93 measures up to 4.9 cm and measured up to 4.0 cm in 2016. Irregular mural thrombus and intimal webs appear to be similar to the previous examination. Celiac: Patent without evidence of aneurysm, dissection, vasculitis or significant stenosis. SMA: Replaced right hepatic artery. SMA is patent without significant stenosis. No evidence for aneurysm or dissection. Renals: There are 2 right renal arteries that are patent without significant stenosis and no evidence for aneurysm or dissection. Both of these right renal arteries are similar in size. The more inferior renal artery is just proximal to the IMA takeoff. Single left renal artery is patent without significant stenosis. No evidence for aneurysm or dissection involving the left renal artery. IMA: Patent without evidence of aneurysm, dissection, vasculitis or significant stenosis. Inflow: Right common iliac artery measures up to 1.9 cm and stable in size. Again noted is an intimal flap or intraluminal web in the right common iliac artery. Again noted is intimal flap or web in the left common iliac artery. Left common iliac artery measures up to 1.8 cm and previously measured 1.6 cm. Bilateral external and internal iliac arteries are patent without significant stenosis or aneurysm formation. Proximal Outflow: Proximal femoral arteries are patent bilaterally. Veins: Portal venous system is patent. IVC and renal veins are patent. Contrast refluxes into the hepatic veins. Review of the MIP images confirms the above findings. NON-VASCULAR Lower chest: Punctate  calcified granuloma in the right lower lobe. No pleural effusions. No significant consolidation at the lung bases. Hepatobiliary: Normal appearance of the liver and gallbladder. No biliary dilatation. Pancreas: Unremarkable. No pancreatic ductal dilatation or surrounding inflammatory changes. Spleen: Normal in size without focal abnormality. Adrenals/Urinary Tract: Normal appearance of the adrenal glands. A tiny hypodensity in left kidney lower pole is suggestive for small cyst. No suspicious renal lesions. No hydronephrosis. A small amount of fluid in the urinary bladder. Slightly decreased enhancement in the right kidney compared to the left on the arterial phase imaging but enhancement is similar on the delayed images. Stomach/Bowel: Moderate to large sized hiatal hernia. High-density material in the appendix without inflammatory changes. No focal bowel inflammation. No evidence for bowel obstruction. Lymphatic: No lymph node enlargement in the abdomen or pelvis. Reproductive: Prostate is prominent for size measuring up to 5.4 cm. Other: Negative for ascites. Musculoskeletal: No acute bone abnormality. IMPRESSION: VASCULAR 1. Abdominal aortic aneurysm has enlarged since 2016. The infrarenal abdominal aortic aneurysm now measures up to 4.9 cm and previously measured up to 4.0 cm. Again noted is marked  atherosclerotic disease with irregular mural thrombus and vascular webs involving the distal abdominal aorta and the common iliac arteries bilaterally. Recommend follow-up CT/MR every 6 months and vascular consultation. This recommendation follows ACR consensus guidelines: White Paper of the ACR Incidental Findings Committee II on Vascular Findings. J Am Coll Radiol 2013; 10:789-794. 2. Enlargement of bilateral common iliac arteries. Right common iliac artery is measures up to 1.9 cm and the left measures up to 1.8 cm. Common iliac arteries have not significantly changed in size since 2016. 3. Two right renal  arteries as described. NON-VASCULAR 1. Moderate to large sized hiatal hernia. 2. No acute abnormality in the abdomen or pelvis. 3. Prostate enlargement. Electronically Signed   By: Markus Daft M.D.   On: 10/20/2020 11:55    Assessment/Plan HLD (hyperlipidemia) lipid control important in reducing the progression of atherosclerotic disease. Continue statin therapy   Abdominal aortic aneurysm I have independently reviewed his CT angiogram today.  This demonstrates a 4.9 cm infrarenal abdominal aortic aneurysm although his lower right renal artery comes off the midportion of the aneurysm.  This would not be salvageable with endovascular repair and unlikely to be salvageable with open repair either.  He has mild iliac artery aneurysms that can be treated with stent graft as well. We had a long discussion today regarding treatment options.  Clearly the less morbid option would be an endovascular repair although the lower right renal artery will need to be coiled/covered.  He will lose a portion of the kidney, but with normal renal function he should do well.  He is otherwise reasonably healthy and does not have diabetes or severe hypertension.  He and his wife voiced their understanding and desire to proceed with endovascular repair. No problem-specific Assessment & Plan notes found for this encounter.    Leotis Pain, MD  10/22/2020 12:08 PM    This note was created with Dragon medical transcription system.  Any errors from dictation are purely unintentional

## 2020-10-25 ENCOUNTER — Ambulatory Visit: Payer: Medicare Other | Attending: Internal Medicine | Admitting: Audiologist

## 2020-10-25 ENCOUNTER — Other Ambulatory Visit: Payer: Self-pay

## 2020-10-25 DIAGNOSIS — H903 Sensorineural hearing loss, bilateral: Secondary | ICD-10-CM | POA: Diagnosis not present

## 2020-10-25 NOTE — Procedures (Signed)
  Outpatient Audiology and [place], [place]  [id number] [phone]  AUDIOLOGICAL  EVALUATION  NAME: Jamie Zhang     DOB:   November 15, 1940      MRN: [id number]                                                                                     DATE: 10/25/2020     REFERENT: Crecencio Mc, MD STATUS: Outpatient DIAGNOSIS: Sensory hearing loss, bilateral    History: [name] was seen for an audiological evaluation.  [name] is receiving a hearing evaluation due to concerns for hearing loss in both ears. [name] has difficulty hearing in background noise, crowds, and when people are at a distance. He noticed recently he hears much better when cupping his hands behind his ears. This difficulty began gradually. No pain or pressure reported in either ear. 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 He says the tinnitus started then and has never gone away. He is able to ignore the tinnitus.  Medical history negative for any condition that is a risk factor for hearing loss. No other relevant case history reported.    Evaluation:   Otoscopy showed a clear view of the tympanic membranes, bilaterally  Tympanometry results were consistent with normal middle ear function, bilaterally    Audiometric testing was completed using conventional audiometry with insert transducer. Speech Recognition Thresholds were consistent with pure tone averages. Word Recognition was excellent at an elevated level and fair at a conversational level. Pure tone thresholds show normal sloping to moderate sensorineural hearing loss in both ears. Test results are consistent with hearing loss to noise exposure and age.   Results:  The test results were reviewed with [name]. He was given several copies of his audiogram. The nature and degree of his hearing loss was explained. He has high frequency hearing loss in  both ears. [name]'s hearing loss is in the high frequencies preventing him from hearing high frequency consonants such as /s/ /sh/ /f/ /t/ and /th/. These sounds help differentiate the words he hears. Without these sounds speechismuffled and unclear.  [name]'s ability to understand speech increased by 20-40% with the speech at a louder volume. This shows excellent candidacy for hearing aids in both ears. Different options for hearing aids were dicussed and he was given a list of local providers. He was also told that [facility] in [place] dispenses hearing aids. He needs to take his test from today with him to a hearing aid consult appointment. [name] reported understanding all that was discussed.      Recommendations: 1. Amplification is necessary for both ears. Hearing aids can be purchased from a variety of locations. See provided list for locations in the [place] area.    Alfonse Alpers  Audiologist, Au.D., CCC-A 10/25/2020  8:42 AM  Cc: Crecencio Mc, MD

## 2020-10-26 ENCOUNTER — Telehealth (INDEPENDENT_AMBULATORY_CARE_PROVIDER_SITE_OTHER): Payer: Self-pay

## 2020-10-26 NOTE — Telephone Encounter (Signed)
Spoke with the patient's spouse and he is scheduled with Dr.  Lucky Cowboy for a Endovascular AAA stent graft repair on 11/03/20 with a 6:15 am arrival time to the [facility]. Pre-op is a phone call on 10/29/20 between 8-1 pm and covid testing is on 11/01/20 between 8-1 pm at the [facility]. Pre-surgical instructions were discussed and will be mailed.

## 2020-10-29 ENCOUNTER — Other Ambulatory Visit (INDEPENDENT_AMBULATORY_CARE_PROVIDER_SITE_OTHER): Payer: Self-pay | Admitting: Nurse Practitioner

## 2020-10-29 ENCOUNTER — Encounter
Admission: RE | Admit: 2020-10-29 | Discharge: 2020-10-29 | Disposition: A | Discharge status: Home / Self Care | Payer: Medicare Other | Source: Ambulatory Visit | Attending: Vascular Surgery | Admitting: Vascular Surgery

## 2020-10-29 ENCOUNTER — Other Ambulatory Visit: Payer: Self-pay

## 2020-10-29 HISTORY — DX: Sleep apnea, unspecified: G47.30

## 2020-10-29 NOTE — Patient Instructions (Signed)
Your procedure is scheduled on: 11/03/20 Report to [facility] AT 6:15 AM . Special Procedures Dept. [phone].  Remember: Instructions that are not followed completely may result in serious medical risk, up to and including death, or upon the discretion of your surgeon and anesthesiologist your surgery may need to be rescheduled.     _X__ 1. Do not eat food after midnight the night before your procedure.                 No gum chewing or hard candies. You may drink clear liquids up to 2 hours                 before you are scheduled to arrive for your surgery- DO not drink clear                 liquids within 2 hours of the start of your surgery.                 Clear Liquids include:  water, apple juice without pulp, clear carbohydrate                 drink such as Clearfast or Gatorade, Black Coffee or Tea (Do not add                 anything to coffee or tea). Diabetics water only  __X__2.  On the morning of surgery brush your teeth with toothpaste and water, you                 may rinse your mouth with mouthwash if you wish.  Do not swallow any              toothpaste of mouthwash.     _X__ 3.  No Alcohol for 24 hours before or after surgery.   _X__ 4.  Do Not Smoke or use e-cigarettes For 24 Hours Prior to Your Surgery.                 Do not use any chewable tobacco products for at least 6 hours prior to                 surgery.  ____  5.  Bring all medications with you on the day of surgery if instructed.   __X__  6.  Notify your doctor if there is any change in your medical condition      (cold, fever, infections).     Do not wear jewelry, make-up, hairpins, clips or nail polish. Do not wear lotions, powders, or perfumes.  Do not shave 48 hours prior to surgery. Men may shave face and neck. Do not bring valuables to the hospital.    [facility] is not responsible for any belongings or valuables.  Contacts, dentures/partials or body piercings  may not be worn into surgery. Bring a case for your contacts, glasses or hearing aids, a denture cup will be supplied. Leave your suitcase in the car. After surgery it may be brought to your room. For patients admitted to the hospital, discharge time is determined by your treatment team.   Patients discharged the day of surgery will not be allowed to drive home.   Please read over the following fact sheets that you were given:   MRSA Information  __X__ Take these medicines the morning of surgery with A SIP OF WATER:    1. atorvastatin (LIPITOR) 20 MG tablet  2. famotidine (PEPCID)  40 MG tablet  3.   4.  5.  6.  ____ Fleet Enema (as directed)   __X__ Use CHG Soap/SAGE wipes as directed  ____ Use inhalers on the day of surgery  ____ Stop metformin/Janumet/Farxiga 2 days prior to surgery    ____ Take 1/2 of usual insulin dose the night before surgery. No insulin the morning          of surgery.   ____ Stop Blood Thinners Coumadin/Plavix/Xarelto/Pleta/Pradaxa/Eliquis/Effient/Aspirin  on   Or contact your Surgeon, Cardiologist or Medical Doctor regarding  ability to stop your blood thinners  __X__ Stop Anti-inflammatories 7 days before surgery such as Advil, Ibuprofen, Motrin,  BC or Goodies Powder, Naprosyn, Naproxen, Aleve, Aspirin    __X__ Stop all herbal supplements, fish oil or vitamin E until after surgery.    __X__ Bring C-Pap to the hospital.

## 2020-11-01 ENCOUNTER — Other Ambulatory Visit: Payer: Medicare Other

## 2020-11-01 ENCOUNTER — Encounter
Admission: RE | Admit: 2020-11-01 | Discharge: 2020-11-01 | Disposition: A | Discharge status: Home / Self Care | Payer: Medicare Other | Source: Ambulatory Visit | Attending: Vascular Surgery | Admitting: Vascular Surgery

## 2020-11-01 ENCOUNTER — Other Ambulatory Visit: Payer: Self-pay

## 2020-11-01 DIAGNOSIS — Z01818 Encounter for other preprocedural examination: Secondary | ICD-10-CM | POA: Insufficient documentation

## 2020-11-01 DIAGNOSIS — Z20822 Contact with and (suspected) exposure to covid-19: Secondary | ICD-10-CM | POA: Insufficient documentation

## 2020-11-01 DIAGNOSIS — Z01810 Encounter for preprocedural cardiovascular examination: Secondary | ICD-10-CM | POA: Diagnosis not present

## 2020-11-01 LAB — BASIC METABOLIC PANEL
Anion gap: 8 (ref 5–15)
BUN: 17 mg/dL (ref 8–23)
CO2: 27 mmol/L (ref 22–32)
Calcium: 9.5 mg/dL (ref 8.9–10.3)
Chloride: 104 mmol/L (ref 98–111)
Creatinine, Ser: 0.88 mg/dL (ref 0.61–1.24)
GFR, Estimated: >60 mL/min (ref >60)
Glucose, Bld: 86 mg/dL (ref 70–99)
Potassium: 3.8 mmol/L (ref 3.5–5.1)
Sodium: 139 mmol/L (ref 135–145)

## 2020-11-01 LAB — CBC WITH DIFFERENTIAL/PLATELET
Abs Immature Granulocytes: 0.02 10*3/uL (ref 0.00–0.07)
Basophils Absolute: 0.1 10*3/uL (ref 0.0–0.1)
Basophils Relative: 1 %
Eosinophils Absolute: 0.1 10*3/uL (ref 0.0–0.5)
Eosinophils Relative: 1 %
HCT: 43.8 % (ref 39.0–52.0)
Hemoglobin: 15.1 g/dL (ref 13.0–17.0)
Immature Granulocytes: 0 %
Lymphocytes Relative: 14 %
Lymphs Abs: 1 10*3/uL (ref 0.7–4.0)
MCH: 32.4 pg (ref 26.0–34.0)
MCHC: 34.5 g/dL (ref 30.0–36.0)
MCV: 94 fL (ref 80.0–100.0)
Monocytes Absolute: 0.6 10*3/uL (ref 0.1–1.0)
Monocytes Relative: 8 %
Neutro Abs: 5.4 10*3/uL (ref 1.7–7.7)
Neutrophils Relative %: 76 %
Platelets: 176 10*3/uL (ref 150–400)
RBC: 4.66 MIL/uL (ref 4.22–5.81)
RDW: 13.3 % (ref 11.5–15.5)
WBC: 7.1 10*3/uL (ref 4.0–10.5)
nRBC: 0 % (ref 0.0–0.2)

## 2020-11-01 LAB — TYPE AND SCREEN
ABO/RH(D): B POS
Antibody Screen: NEGATIVE

## 2020-11-01 LAB — SARS CORONAVIRUS 2 (TAT 6-24 HRS): SARS Coronavirus 2: NEGATIVE

## 2020-11-01 LAB — PROTIME-INR
INR: 1.1 (ref 0.8–1.2)
Prothrombin Time: 13.6 seconds (ref 11.4–15.2)

## 2020-11-01 LAB — APTT: aPTT: 29 seconds (ref 24–36)

## 2020-11-03 ENCOUNTER — Encounter: Payer: Self-pay | Admitting: Vascular Surgery

## 2020-11-03 ENCOUNTER — Ambulatory Visit: Payer: Medicare Other | Admitting: Anesthesiology

## 2020-11-03 ENCOUNTER — Encounter
Admission: RE | Disposition: A | Discharge status: Home / Self Care | Payer: Self-pay | Source: Home / Self Care | Attending: Vascular Surgery

## 2020-11-03 ENCOUNTER — Inpatient Hospital Stay
Admission: RE | Admit: 2020-11-03 | Discharge: 2020-11-04 | DRG: 269 | Disposition: A | Discharge status: Home / Self Care | Payer: Medicare Other | Attending: Vascular Surgery | Admitting: Vascular Surgery

## 2020-11-03 ENCOUNTER — Other Ambulatory Visit: Payer: Self-pay

## 2020-11-03 DIAGNOSIS — K219 Gastro-esophageal reflux disease without esophagitis: Secondary | ICD-10-CM | POA: Diagnosis not present

## 2020-11-03 DIAGNOSIS — Z803 Family history of malignant neoplasm of breast: Secondary | ICD-10-CM

## 2020-11-03 DIAGNOSIS — I714 Abdominal aortic aneurysm, without rupture, unspecified: Secondary | ICD-10-CM | POA: Diagnosis present

## 2020-11-03 DIAGNOSIS — G4730 Sleep apnea, unspecified: Secondary | ICD-10-CM | POA: Diagnosis present

## 2020-11-03 DIAGNOSIS — Z20822 Contact with and (suspected) exposure to covid-19: Secondary | ICD-10-CM | POA: Diagnosis present

## 2020-11-03 DIAGNOSIS — Z85828 Personal history of other malignant neoplasm of skin: Secondary | ICD-10-CM | POA: Diagnosis not present

## 2020-11-03 DIAGNOSIS — E1151 Type 2 diabetes mellitus with diabetic peripheral angiopathy without gangrene: Secondary | ICD-10-CM | POA: Diagnosis not present

## 2020-11-03 DIAGNOSIS — Z8719 Personal history of other diseases of the digestive system: Secondary | ICD-10-CM | POA: Diagnosis not present

## 2020-11-03 DIAGNOSIS — I723 Aneurysm of iliac artery: Secondary | ICD-10-CM | POA: Diagnosis present

## 2020-11-03 DIAGNOSIS — G2581 Restless legs syndrome: Secondary | ICD-10-CM | POA: Diagnosis present

## 2020-11-03 DIAGNOSIS — M8580 Other specified disorders of bone density and structure, unspecified site: Secondary | ICD-10-CM | POA: Diagnosis present

## 2020-11-03 DIAGNOSIS — Z79899 Other long term (current) drug therapy: Secondary | ICD-10-CM | POA: Diagnosis not present

## 2020-11-03 DIAGNOSIS — Z87891 Personal history of nicotine dependence: Secondary | ICD-10-CM

## 2020-11-03 DIAGNOSIS — Z8261 Family history of arthritis: Secondary | ICD-10-CM | POA: Diagnosis not present

## 2020-11-03 DIAGNOSIS — M1990 Unspecified osteoarthritis, unspecified site: Secondary | ICD-10-CM | POA: Diagnosis not present

## 2020-11-03 DIAGNOSIS — Z8249 Family history of ischemic heart disease and other diseases of the circulatory system: Secondary | ICD-10-CM

## 2020-11-03 DIAGNOSIS — E785 Hyperlipidemia, unspecified: Secondary | ICD-10-CM | POA: Diagnosis present

## 2020-11-03 DIAGNOSIS — Z7982 Long term (current) use of aspirin: Secondary | ICD-10-CM

## 2020-11-03 DIAGNOSIS — E119 Type 2 diabetes mellitus without complications: Secondary | ICD-10-CM | POA: Diagnosis not present

## 2020-11-03 DIAGNOSIS — D649 Anemia, unspecified: Secondary | ICD-10-CM | POA: Diagnosis not present

## 2020-11-03 DIAGNOSIS — Z808 Family history of malignant neoplasm of other organs or systems: Secondary | ICD-10-CM

## 2020-11-03 HISTORY — PX: ENDOVASCULAR STENT GRAFT (AAA): CATH118280

## 2020-11-03 LAB — GLUCOSE, CAPILLARY: Glucose-Capillary: 84 mg/dL (ref 70–99)

## 2020-11-03 SURGERY — ENDOVASCULAR REPAIR/STENT GRAFT
Anesthesia: General

## 2020-11-03 MED ORDER — PROPOFOL 10 MG/ML IV BOLUS
INTRAVENOUS | Status: DC | PRN
Start: 2020-11-03 — End: 2020-11-03
  Administered 2020-11-03: 30 mg via INTRAVENOUS
  Administered 2020-11-03: 170 mg via INTRAVENOUS

## 2020-11-03 MED ORDER — ONDANSETRON HCL 4 MG/2ML IJ SOLN: 4.0000 mg | Freq: Four times a day (QID) | INTRAMUSCULAR | Status: DC | PRN

## 2020-11-03 MED ORDER — PROPOFOL 10 MG/ML IV BOLUS
INTRAVENOUS | Status: AC
  Filled 2020-11-03: qty 20

## 2020-11-03 MED ORDER — SODIUM CHLORIDE 0.9 % IV SOLN: INTRAVENOUS | Status: DC

## 2020-11-03 MED ORDER — ACETAMINOPHEN 325 MG PO TABS: 325.0000 mg | ORAL_TABLET | ORAL | Status: DC | PRN

## 2020-11-03 MED ORDER — SODIUM CHLORIDE 0.9 % IV SOLN: 500.0000 mL | Freq: Once | INTRAVENOUS | Status: DC | PRN

## 2020-11-03 MED ORDER — ATORVASTATIN CALCIUM 20 MG PO TABS
20.0000 mg | ORAL_TABLET | Freq: Every day | ORAL | Status: DC
Start: 2020-11-03 — End: 2020-11-04
  Administered 2020-11-03: 20:00:00 20 mg via ORAL
  Filled 2020-11-03 (×2): qty 1

## 2020-11-03 MED ORDER — HYDRALAZINE HCL 20 MG/ML IJ SOLN: 5.0000 mg | INTRAMUSCULAR | Status: DC | PRN

## 2020-11-03 MED ORDER — DOCUSATE SODIUM 100 MG PO CAPS
100.0000 mg | ORAL_CAPSULE | ORAL | Status: DC
  Filled 2020-11-03: qty 1

## 2020-11-03 MED ORDER — FAMOTIDINE IN NACL 20-0.9 MG/50ML-% IV SOLN
20.0000 mg | Freq: Two times a day (BID) | INTRAVENOUS | Status: DC
  Filled 2020-11-03: qty 50

## 2020-11-03 MED ORDER — LIDOCAINE HCL (CARDIAC) PF 100 MG/5ML IV SOSY
PREFILLED_SYRINGE | INTRAVENOUS | Status: DC | PRN
  Administered 2020-11-03: 100 mg via INTRAVENOUS

## 2020-11-03 MED ORDER — VITAMIN D3 25 MCG (1000 UNIT) PO TABS
10000.0000 [IU] | ORAL_TABLET | Freq: Every day | ORAL | Status: DC
  Filled 2020-11-03 (×2): qty 10

## 2020-11-03 MED ORDER — ALUM & MAG HYDROXIDE-SIMETH 200-200-20 MG/5ML PO SUSP
ORAL | Status: AC
Start: 2020-11-03 — End: 2020-11-03
  Administered 2020-11-03: 20:00:00 30 mL via ORAL
  Filled 2020-11-03: qty 30

## 2020-11-03 MED ORDER — FAMOTIDINE 20 MG PO TABS
ORAL_TABLET | ORAL | Status: AC
  Administered 2020-11-03: 20:00:00 40 mg via ORAL
  Filled 2020-11-03: qty 2

## 2020-11-03 MED ORDER — DOPAMINE-DEXTROSE 3.2-5 MG/ML-% IV SOLN: 3.0000 ug/kg/min | INTRAVENOUS | Status: DC

## 2020-11-03 MED ORDER — CHLORHEXIDINE GLUCONATE CLOTH 2 % EX PADS
6.0000 | MEDICATED_PAD | Freq: Once | CUTANEOUS | Status: AC
  Administered 2020-11-03 (×2): 6 via TOPICAL

## 2020-11-03 MED ORDER — MORPHINE SULFATE (PF) 4 MG/ML IV SOLN
2.0000 mg | INTRAVENOUS | Status: DC | PRN
Start: 2020-11-03 — End: 2020-11-04

## 2020-11-03 MED ORDER — CHLORHEXIDINE GLUCONATE 0.12 % MT SOLN
15.0000 mL | Freq: Once | OROMUCOSAL | Status: AC
  Administered 2020-11-03: 08:00:00 15 mL via OROMUCOSAL
  Filled 2020-11-03: qty 15

## 2020-11-03 MED ORDER — CEFAZOLIN SODIUM-DEXTROSE 2-4 GM/100ML-% IV SOLN
INTRAVENOUS | Status: AC
  Filled 2020-11-03: qty 100

## 2020-11-03 MED ORDER — DICLOFENAC SODIUM 75 MG PO TBEC
75.0000 mg | DELAYED_RELEASE_TABLET | Freq: Two times a day (BID) | ORAL | Status: DC | PRN
  Administered 2020-11-03: 75 mg via ORAL
  Filled 2020-11-03 (×4): qty 1

## 2020-11-03 MED ORDER — NITROGLYCERIN IN D5W 200-5 MCG/ML-% IV SOLN: 5.0000 ug/min | INTRAVENOUS | Status: DC

## 2020-11-03 MED ORDER — SUGAMMADEX SODIUM 200 MG/2ML IV SOLN
INTRAVENOUS | Status: DC | PRN
  Administered 2020-11-03: 200 mg via INTRAVENOUS

## 2020-11-03 MED ORDER — METOPROLOL TARTRATE 5 MG/5ML IV SOLN
2.0000 mg | INTRAVENOUS | Status: DC | PRN
Start: 2020-11-03 — End: 2020-11-04

## 2020-11-03 MED ORDER — EPHEDRINE SULFATE 50 MG/ML IJ SOLN
INTRAMUSCULAR | Status: DC | PRN
  Administered 2020-11-03 (×2): 10 mg via INTRAVENOUS
  Administered 2020-11-03 (×2): 5 mg via INTRAVENOUS

## 2020-11-03 MED ORDER — DEXAMETHASONE SODIUM PHOSPHATE 10 MG/ML IJ SOLN
INTRAMUSCULAR | Status: DC | PRN
  Administered 2020-11-03: 8 mg via INTRAVENOUS

## 2020-11-03 MED ORDER — DEXAMETHASONE SODIUM PHOSPHATE 10 MG/ML IJ SOLN
INTRAMUSCULAR | Status: AC
  Filled 2020-11-03: qty 1

## 2020-11-03 MED ORDER — MAGNESIUM OXIDE 400 MG PO TABS
400.0000 mg | ORAL_TABLET | Freq: Every day | ORAL | Status: DC
  Administered 2020-11-04: 09:00:00 400 mg via ORAL
  Filled 2020-11-03 (×2): qty 1

## 2020-11-03 MED ORDER — IODIXANOL 320 MG/ML IV SOLN
INTRAVENOUS | Status: DC | PRN
  Administered 2020-11-03: 09:00:00 60 mL

## 2020-11-03 MED ORDER — HEPARIN SODIUM (PORCINE) 1000 UNIT/ML IJ SOLN
INTRAMUSCULAR | Status: DC | PRN
  Administered 2020-11-03: 6000 [IU] via INTRAVENOUS

## 2020-11-03 MED ORDER — MECLIZINE HCL 12.5 MG PO TABS
12.5000 mg | ORAL_TABLET | Freq: Three times a day (TID) | ORAL | Status: DC | PRN
  Filled 2020-11-03: qty 1

## 2020-11-03 MED ORDER — ONDANSETRON HCL 4 MG/2ML IJ SOLN
INTRAMUSCULAR | Status: AC
  Filled 2020-11-03: qty 2

## 2020-11-03 MED ORDER — HYDROMORPHONE HCL 1 MG/ML IJ SOLN: 1.0000 mg | Freq: Once | INTRAMUSCULAR | Status: DC | PRN

## 2020-11-03 MED ORDER — ACETAMINOPHEN 325 MG RE SUPP
325.0000 mg | RECTAL | Status: DC | PRN
Start: 2020-11-03 — End: 2020-11-04
  Filled 2020-11-03: qty 2

## 2020-11-03 MED ORDER — ROCURONIUM BROMIDE 10 MG/ML (PF) SYRINGE
PREFILLED_SYRINGE | INTRAVENOUS | Status: AC
  Filled 2020-11-03: qty 10

## 2020-11-03 MED ORDER — ONDANSETRON HCL 4 MG/2ML IJ SOLN: 4.0000 mg | Freq: Once | INTRAMUSCULAR | Status: DC | PRN

## 2020-11-03 MED ORDER — GUAIFENESIN-DM 100-10 MG/5ML PO SYRP
15.0000 mL | ORAL_SOLUTION | ORAL | Status: DC | PRN
  Filled 2020-11-03: qty 15

## 2020-11-03 MED ORDER — LIDOCAINE HCL (PF) 2 % IJ SOLN
INTRAMUSCULAR | Status: AC
  Filled 2020-11-03: qty 5

## 2020-11-03 MED ORDER — CEFAZOLIN SODIUM-DEXTROSE 2-4 GM/100ML-% IV SOLN
2.0000 g | Freq: Three times a day (TID) | INTRAVENOUS | Status: AC
  Administered 2020-11-03: 11:00:00 2 g via INTRAVENOUS

## 2020-11-03 MED ORDER — FAMOTIDINE 20 MG PO TABS
40.0000 mg | ORAL_TABLET | Freq: Every day | ORAL | Status: DC
Start: 2020-11-03 — End: 2020-11-04

## 2020-11-03 MED ORDER — CEFAZOLIN SODIUM-DEXTROSE 2-4 GM/100ML-% IV SOLN
INTRAVENOUS | Status: AC
  Administered 2020-11-03: 20:00:00 2 g via INTRAVENOUS
  Filled 2020-11-03: qty 100

## 2020-11-03 MED ORDER — POTASSIUM CHLORIDE CRYS ER 20 MEQ PO TBCR: 20.0000 meq | EXTENDED_RELEASE_TABLET | Freq: Every day | ORAL | Status: DC | PRN

## 2020-11-03 MED ORDER — ONDANSETRON HCL 4 MG/2ML IJ SOLN
INTRAMUSCULAR | Status: DC | PRN
  Administered 2020-11-03: 4 mg via INTRAVENOUS

## 2020-11-03 MED ORDER — FENTANYL CITRATE (PF) 100 MCG/2ML IJ SOLN
INTRAMUSCULAR | Status: AC
  Filled 2020-11-03: qty 2

## 2020-11-03 MED ORDER — ASPIRIN EC 81 MG PO TBEC
81.0000 mg | DELAYED_RELEASE_TABLET | Freq: Every day | ORAL | Status: DC
  Filled 2020-11-03 (×2): qty 1

## 2020-11-03 MED ORDER — ROCURONIUM BROMIDE 100 MG/10ML IV SOLN
INTRAVENOUS | Status: DC | PRN
  Administered 2020-11-03: 50 mg via INTRAVENOUS

## 2020-11-03 MED ORDER — LACTATED RINGERS IV SOLN: INTRAVENOUS | Status: DC

## 2020-11-03 MED ORDER — LABETALOL HCL 5 MG/ML IV SOLN
INTRAVENOUS | Status: AC
  Administered 2020-11-03: 20:00:00 10 mg via INTRAVENOUS
  Filled 2020-11-03: qty 4

## 2020-11-03 MED ORDER — CEFAZOLIN SODIUM-DEXTROSE 2-4 GM/100ML-% IV SOLN
2.0000 g | INTRAVENOUS | Status: AC
  Administered 2020-11-03: 08:00:00 2 g via INTRAVENOUS

## 2020-11-03 MED ORDER — MEPERIDINE HCL 25 MG/ML IJ SOLN
6.2500 mg | INTRAMUSCULAR | Status: DC | PRN
  Filled 2020-11-03: qty 1

## 2020-11-03 MED ORDER — ORAL CARE MOUTH RINSE: 15.0000 mL | Freq: Once | OROMUCOSAL | Status: AC

## 2020-11-03 MED ORDER — FLUTICASONE PROPIONATE 50 MCG/ACT NA SUSP
2.0000 | Freq: Every day | NASAL | Status: DC
  Administered 2020-11-03: 22:00:00 2 via NASAL
  Filled 2020-11-03: qty 16

## 2020-11-03 MED ORDER — OXYCODONE-ACETAMINOPHEN 5-325 MG PO TABS
1.0000 | ORAL_TABLET | ORAL | Status: DC | PRN
Start: 2020-11-03 — End: 2020-11-04

## 2020-11-03 MED ORDER — PHENOL 1.4 % MT LIQD
1.0000 | OROMUCOSAL | Status: DC | PRN
  Filled 2020-11-03: qty 177

## 2020-11-03 MED ORDER — DOCUSATE SODIUM 100 MG PO CAPS
100.0000 mg | ORAL_CAPSULE | Freq: Every day | ORAL | Status: DC
  Administered 2020-11-04: 10:00:00 100 mg via ORAL
  Filled 2020-11-03: qty 1

## 2020-11-03 MED ORDER — FENTANYL CITRATE (PF) 100 MCG/2ML IJ SOLN
INTRAMUSCULAR | Status: DC | PRN
  Administered 2020-11-03 (×2): 50 ug via INTRAVENOUS

## 2020-11-03 MED ORDER — LABETALOL HCL 5 MG/ML IV SOLN
10.0000 mg | INTRAVENOUS | Status: DC | PRN
  Administered 2020-11-03: 20:00:00 10 mg via INTRAVENOUS

## 2020-11-03 MED ORDER — ALUM & MAG HYDROXIDE-SIMETH 200-200-20 MG/5ML PO SUSP: 15.0000 mL | ORAL | Status: DC | PRN

## 2020-11-03 MED ORDER — FENTANYL CITRATE (PF) 100 MCG/2ML IJ SOLN: 25.0000 ug | INTRAMUSCULAR | Status: DC | PRN

## 2020-11-03 MED ORDER — MAGNESIUM SULFATE 2 GM/50ML IV SOLN
2.0000 g | Freq: Every day | INTRAVENOUS | Status: DC | PRN
  Filled 2020-11-03: qty 50

## 2020-11-03 MED ORDER — CHLORHEXIDINE GLUCONATE CLOTH 2 % EX PADS
6.0000 | MEDICATED_PAD | Freq: Once | CUTANEOUS | Status: AC
  Administered 2020-11-03: 08:00:00 6 via TOPICAL

## 2020-11-03 MED ORDER — CLOPIDOGREL BISULFATE 75 MG PO TABS
75.0000 mg | ORAL_TABLET | Freq: Every day | ORAL | Status: DC
  Administered 2020-11-04: 06:00:00 75 mg via ORAL
  Filled 2020-11-03: qty 1

## 2020-11-03 SURGICAL SUPPLY — 51 items
BLADE SURG 15 STRL LF DISP TIS (BLADE) ×1 IMPLANT
BLADE SURG 15 STRL SS (BLADE) ×2
BLADE SURG SZ11 CARB STEEL (BLADE) ×3 IMPLANT
BRUSH SCRUB EZ  4% CHG (MISCELLANEOUS) ×2
BRUSH SCRUB EZ 4% CHG (MISCELLANEOUS) ×1 IMPLANT
CATH ACCU-VU SIZ PIG 5F 70CM (CATHETERS) ×3 IMPLANT
CATH BALLN CODA 9X100X32 (BALLOONS) ×3 IMPLANT
CATH BEACON 5 .035 40 KMP TP (CATHETERS) ×1 IMPLANT
CATH BEACON 5 .038 40 KMP TP (CATHETERS) ×2
CATH MICROCATH PRGRT 2.8F 110 (CATHETERS) ×1 IMPLANT
CATH VS15FR (CATHETERS) ×3 IMPLANT
CLOSURE PERCLOSE PROSTYLE (VASCULAR PRODUCTS) ×21 IMPLANT
COIL 400 COMPLEX SOFT 6X30CM (Vascular Products) ×3 IMPLANT
COIL 400 COMPLEX STD 6X20CM (Vascular Products) ×3 IMPLANT
DERMABOND ADVANCED (GAUZE/BANDAGES/DRESSINGS) ×2
DERMABOND ADVANCED .7 DNX12 (GAUZE/BANDAGES/DRESSINGS) ×1 IMPLANT
DEVICE OCCLUSION PODJ30 (Vascular Products) ×1 IMPLANT
DEVICE SAFEGUARD 24CM (GAUZE/BANDAGES/DRESSINGS) ×6 IMPLANT
DEVICE TORQUE .025-.038 (MISCELLANEOUS) ×3 IMPLANT
DRYSEAL FLEXSHEATH 12FR 33CM (SHEATH) ×2
DRYSEAL FLEXSHEATH 18FR 33CM (SHEATH) ×2
ELECT CAUTERY BLADE 6.4 (BLADE) ×3 IMPLANT
ELECT REM PT RETURN 9FT ADLT (ELECTROSURGICAL) ×3
ELECTRODE REM PT RTRN 9FT ADLT (ELECTROSURGICAL) ×1 IMPLANT
EXCLUDER TNK LEG 31MX14X13 (Endovascular Graft) ×1 IMPLANT
EXCLUDER TRUNK LEG 31MX14X13 (Endovascular Graft) ×3 IMPLANT
GLIDEWIRE STIFF .35X180X3 HYDR (WIRE) ×3 IMPLANT
GLOVE BIO SURGEON STRL SZ7 (GLOVE) ×3 IMPLANT
GOWN STRL REUS W/ TWL LRG LVL3 (GOWN DISPOSABLE) ×2 IMPLANT
GOWN STRL REUS W/ TWL XL LVL3 (GOWN DISPOSABLE) ×2 IMPLANT
GOWN STRL REUS W/TWL LRG LVL3 (GOWN DISPOSABLE) ×4
GOWN STRL REUS W/TWL XL LVL3 (GOWN DISPOSABLE) ×4
HANDLE DETACHMENT COIL (MISCELLANEOUS) ×3 IMPLANT
LEG CONTRALATERAL 20X11.5 (Vascular Products) ×2 IMPLANT
LEG CONTRALATERAL 23X10 (Vascular Products) ×3 IMPLANT
MICROCATH PROGREAT 2.8F 110 CM (CATHETERS) ×3
NEEDLE ENTRY 21GA 7CM ECHOTIP (NEEDLE) ×3 IMPLANT
OCCLUSION DEVICE PODJ30 (Vascular Products) ×3 IMPLANT
PACK ANGIOGRAPHY (CUSTOM PROCEDURE TRAY) ×3 IMPLANT
PACK BASIN MAJOR ARMC (MISCELLANEOUS) ×3 IMPLANT
SET INTRO CAPELLA COAXIAL (SET/KITS/TRAYS/PACK) ×3 IMPLANT
SHEATH BRITE TIP 6FRX11 (SHEATH) ×6 IMPLANT
SHEATH BRITE TIP 8FRX11 (SHEATH) ×6 IMPLANT
SHEATH DRYSEAL FLEX 12FR 33CM (SHEATH) ×1 IMPLANT
SHEATH DRYSEAL FLEX 18FR 33CM (SHEATH) ×1 IMPLANT
STENT GRAFT CONTRALAT 20X11.5 (Vascular Products) ×1 IMPLANT
SUT MNCRL 4-0 (SUTURE) ×2
SUT MNCRL 4-0 27XMFL (SUTURE) ×1
SUTURE MNCRL 4-0 27XMF (SUTURE) ×1 IMPLANT
WIRE AMPLATZ SSTIFF .035X260CM (WIRE) ×6 IMPLANT
WIRE GUIDERIGHT .035X150 (WIRE) ×6 IMPLANT

## 2020-11-03 NOTE — Progress Notes (Signed)
Left side groin bleeding again, pressure dressing reapplied after 15 minutes of pressure,

## 2020-11-03 NOTE — Anesthesia Postprocedure Evaluation (Signed)
Anesthesia Post Note  Patient: Jamie Zhang  Procedure(s) Performed: ENDOVASCULAR REPAIR/STENT GRAFT (N/A )  Patient location during evaluation: PACU Anesthesia Type: General Level of consciousness: awake and alert, awake and oriented Pain management: pain level controlled Vital Signs Assessment: post-procedure vital signs reviewed and stable Respiratory status: spontaneous breathing, nonlabored ventilation and respiratory function stable Cardiovascular status: blood pressure returned to baseline Postop Assessment: no apparent nausea or vomiting Anesthetic complications: no   No complications documented.   Last Vitals:  Vitals:   11/03/20 1345 11/03/20 1400  BP: (!) 152/95   Pulse: 78 65  Resp: 17 15  Temp:    SpO2: 98% 96%    Last Pain:  Vitals:   11/03/20 1400  TempSrc:   PainSc: 0-No pain                 Phill Mutter

## 2020-11-03 NOTE — Progress Notes (Signed)
Assumed care of patient. Jamie Zhang awake/alert x4. Safeguard in place right and left femoral area.  Left femoral with old drainage under guard, 95ml air in both safeguards. bil ext warm to touch, pulses intact. Tolerating sips of gingerale without event. Will continue to monitor closely.

## 2020-11-03 NOTE — Op Note (Signed)
OPERATIVE NOTE   PROCEDURE: 1. US guidance for vascular access, bilateral femoral arteries 2. Catheter placement into aorta from bilateral femoral approaches 3. Catheter placement into both right renal arteries from right femoral approach with selective renal angiograms of both right renal arteries 4. Coil embolization of the lower right renal artery coming off of the aneurysm with a total of 3 Ruby coils 5. Placement of a 31 mm proximal, 13 cm length Gore Excluder Endoprosthesis main body right with a 20 mm diameter by 12 cm length left iliac contralateral limb 6. Placement of a 23 mm diameter by 10 cm length right iliac extension limb 7. ProGlide closure devices bilateral femoral arteries  PRE-OPERATIVE DIAGNOSIS: AAA  POST-OPERATIVE DIAGNOSIS: same  SURGEON: Leotis Pain, MD and Hortencia Pilar, MD - Co-surgeons  ANESTHESIA: General  ESTIMATED BLOOD LOSS: 25 cc  FINDING(S): 1.  AAA 2.  The superior right renal artery fed almost the entire kidney and was above the aneurysm and could be saved.  The inferior right renal artery came off of the aneurysm and could not be saved and did not feed as much of the kidney parenchyma and we elected to perform coil embolization to avoid a type II endoleak.  SPECIMEN(S):  none  INDICATIONS:   Jamie Zhang is a 79 y.o. male who presents with a roughly 5 cm abdominal aortic aneurysm. The anatomy was suitable for endovascular repair.  He did have 2 right renal arteries with the lower of the 2 renal arteries coming off of the aneurysm and likely not being salvageable.  We would assess the flow to the right kidney to make sure this was a safe proposition.  Risks and benefits of repair in an endovascular fashion were discussed and informed consent was obtained. Co-surgeons are used to expedite the procedure and reduce operative time as bilateral work needs to be done.  DESCRIPTION: After obtaining full informed written consent, the patient was  brought back to the operating room and placed supine upon the operating table.  The patient received IV antibiotics prior to induction.  After obtaining adequate anesthesia, the patient was prepped and draped in the standard fashion for endovascular AAA repair.  We then began by gaining access to both femoral arteries with US guidance with me working on the right and Dr. Delana Meyer working on the left.  The femoral arteries were found to be patent and accessed without difficulty with a needle under ultrasound guidance without difficulty on each side and permanent images were recorded.  We then placed 2 proglide devices on each side in a pre-close fashion and placed 8 French sheaths. The patient was then given 6000 units of intravenous heparin. The Pigtail catheter was placed into the aorta from the right side. Using this image, we visualized both renal arteries on the right.  I then used a V S1 catheter and selectively cannulated the superior right renal artery first without difficulty.  Selective imaging of the superior right renal artery was performed showing that this fed almost the entire kidney parenchyma.  It did not have any stenosis.  The inferior right renal artery was then cannulated with a V S1 catheter and selective imaging was performed of the inferior right renal artery.  Although this was a reasonably large artery, it did not feed nearly as much of the kidney parenchyma.  This would need to be coil embolized.  A progreat microcatheter was then placed through the V S1 catheter out to the distal main right  renal artery.  I started with a 6 mm diameter by 20 cm length Ruby coil at the distal right renal artery which extended into the 2 primary branches of the renal artery.  A 6 mm diameter by 30 cm length soft coil was then packed in the distal main renal artery followed by a 30 cm long packing coil.  Following this, the microcatheter was removed and selective imaging through the V S1 catheter showed  excellent location of the coils in the right renal artery.  The V S1 catheter was removed and an Amplatz Super Stiff wire was placed.  We selected a 31 mm proximal 13 cm length Gore Excluder endoprosthesis Main body device.  Over a stiff wire, an 12 French sheath was placed up the right. The main body was then placed through the 18 French sheath. A Kumpe catheter was placed up the left side and a magnified image at the renal arteries was performed. The main body was then deployed just below the lowest renal artery was the left. The Kumpe catheter was used to cannulate the contralateral gate without difficulty and successful cannulation was confirmed by twirling the pigtail catheter in the main body. We then placed a stiff wire and a retrograde arteriogram was performed through the left femoral sheath. We upsized to the 12 [place] sheath on the left for the contralateral limb and a 20 mm diameter by 10 cm length limb was selected and deployed. The main body deployment was then completed. Based off the angiographic findings, extension limbs were necessary.  A 23 mm diameter by 10 cm length bell bottom right iliac extension limb was selected and deployed just above the right hypogastric artery. All junction points and seals zones were treated with the compliant balloon. The pigtail catheter was then replaced and a completion angiogram was performed.  A type II endoleak was detected on completion angiography. The renal arteries were found to be widely patent.  Both hypogastric arteries had flow. At this point we elected to terminate the procedure. We secured the pro glide devices for hemostasis on the femoral arteries. The skin incision was closed with a 4-0 Monocryl. Dermabond and pressure dressing were placed. The patient was taken to the recovery room in stable condition having tolerated the procedure well.  COMPLICATIONS: none  CONDITION: stable  Leotis Pain  11/03/2020, 9:17 AM   This note was created with  [employer] transcription system. Any errors in dictation are purely unintentional.

## 2020-11-03 NOTE — Anesthesia Procedure Notes (Signed)
Procedure Name: Intubation Date/Time: 11/03/2020 7:52 AM Performed by: Aline Brochure, CRNA Pre-anesthesia Checklist: Patient identified, Emergency Drugs available, Suction available and Patient being monitored Patient Re-evaluated:Patient Re-evaluated prior to induction Oxygen Delivery Method: Circle system utilized Preoxygenation: Pre-oxygenation with 100% oxygen Induction Type: IV induction Ventilation: Mask ventilation without difficulty Laryngoscope Size: McGraph and 4 Grade View: Grade I Tube type: Oral Tube size: 7.5 mm Number of attempts: 1 Airway Equipment and Method: Stylet and Video-laryngoscopy Placement Confirmation: ETT inserted through vocal cords under direct vision,  positive ETCO2 and breath sounds checked- equal and bilateral Secured at: 22 cm Tube secured with: Tape Dental Injury: Teeth and Oropharynx as per pre-operative assessment

## 2020-11-03 NOTE — Progress Notes (Signed)
Patient awake/alert x4. NPO since midnight. Verbalizes understanding of procedure, post op care. Wife at bedside. Denies any c/o's Lungs CTA  Heart sounds regular.

## 2020-11-03 NOTE — Op Note (Signed)
OPERATIVE NOTE   PROCEDURE: 1. US guidance for vascular access, bilateral femoral arteries 2. Catheter placement into aorta from bilateral femoral approaches 3. Placement of a 31 x 14 x 13 Gore Excluder Endoprosthesis main body with a 23 x 10 extender and a 20 x 12 contralateral limb 4. Selective injection of the superior right renal artery first order catheter placement 5. Coil embolization of the inferior right renal artery with first-order catheter placement. 6. ProGlide closure devices bilateral femoral arteries  PRE-OPERATIVE DIAGNOSIS: AAA  POST-OPERATIVE DIAGNOSIS: same  SURGEON: Jamie Pilar, MD and Jamie Pain, MD - Co-surgeons  ANESTHESIA: general  ESTIMATED BLOOD LOSS: 50 cc  FINDING(S): 1.  AAA with an inferior secondary right renal artery noted originating from the aneurysm sac  SPECIMEN(S):  none  INDICATIONS:   Jamie Zhang is a 79 y.o. y.o. male who presents with an enlarging abdominal aortic aneurysm.  Work-up has demonstrated he is a candidate for endovascular repair.  Of note he does have an inferior right renal artery which originates from the aneurysm sac and coil embolization of this renal artery is planned and discussed had a time with the patient.  Repair is recommended to prevent lethal rupture.  DESCRIPTION: After obtaining full informed written consent, the patient was brought back to the operating room and placed supine upon the operating table.  The patient received IV antibiotics prior to induction.  After obtaining adequate anesthesia, the patient was prepped and draped in the standard fashion for endovascular AAA repair.  Co-surgeons are required because this is a complex bilateral procedure with work being performed simultaneously from both the right femoral and left femoral approach.  This also expedites the procedure making a shorter operative time reducing complications and improving patient safety.  We then began by gaining access to both  femoral arteries with US guidance with me working on the patient's left and Dr. Lucky Cowboy working on the patient's right.  The femoral arteries were found to be patent and accessed without difficulty with a needle under ultrasound guidance without difficulty on each side and permanent images were recorded.  We then placed 2 proglide devices on each side in a pre-close fashion and placed 8 French sheaths.  The patient was then given 6000 units of intravenous heparin.   The Pigtail catheter was placed into the aorta from the right side.  Initially we addressed the renal artery situation on the right.  As noted above the patient has 2 renal arteries feeding the right kidney.  The superior renal artery is significantly larger feeding a significantly greater portion of the entire kidney.  The inferior renal artery is therefore feeding a minor portion of the right renal parenchyma it also originates from the aneurysm sac and coil embolization is required to prevent a persistent type II endoleak.  Based on mapping from the aortogram Dr. Lucky Cowboy advanced a VS 1 catheter and selected the superior renal artery.  Hand-injection of contrast with a first-order catheter placement was then performed to verify that this artery is patent and disease-free and is the dominant renal artery.  Having confirmed this the inferior renal artery on the right was then addressed.  Again the VS 1 catheter was used to selected hand-injection contrast verified catheter placement and a prograde catheter was advanced out into the inferior right renal artery.  3 Ruby coils were then deployed occluding the renal artery.  Please see Dr. Bunnie Domino note for a description of the coils.  Attention was then turned  to repairing the infrarenal abdominal aortic aneurysm.  Using this image, we selected a 31 x 14 x 13 Main body device.  Over a stiff wire, an 66 French sheath was placed. The main body was then placed through the 18 French sheath. A Kumpe catheter was  placed up the left side and a magnified image at the renal arteries was performed. The main body was then deployed just below the lowest renal artery. The Kumpe catheter was used to cannulate the contralateral gate without difficulty and successful cannulation was confirmed by twirling the pigtail catheter in the main body. We then placed a stiff wire and a retrograde arteriogram was performed through the left femoral sheath. We upsized to the 12 [place] sheath for the contralateral limb and a 20 x 12 limb was selected and deployed. The main body deployment was then completed. Based off the angiographic findings, extension limbs were necessary.  A 23 x 10 extender limb was then selected for the right advanced up the 18 French sheath and deployed down to the right iliac bifurcation. All junction points and seals zones were treated with the compliant balloon.   The pigtail catheter was then replaced and a completion angiogram was performed.   Type II endoleak was detected on completion angiography. The renal arteries were found to be widely patent.  Bilateral iliac bifurcations were patent.   At this point we elected to terminate the procedure. We secured the pro glide devices for hemostasis on the femoral arteries. The skin incision was closed with a 4-0 Monocryl. Dermabond and pressure dressing were placed. The patient was taken to the recovery room in stable condition having tolerated the procedure well.  COMPLICATIONS: none  CONDITION: stable  Jamie Zhang  11/03/2020, 9:26 AM

## 2020-11-03 NOTE — Transfer of Care (Signed)
Immediate Anesthesia Transfer of Care Note  Patient: Jamie Zhang  Procedure(s) Performed: ENDOVASCULAR REPAIR/STENT GRAFT (N/A )  Patient Location: PACU  Anesthesia Type:General  Level of Consciousness: awake  Airway & Oxygen Therapy: Patient connected to face mask oxygen  Post-op Assessment: Post -op Vital signs reviewed and stable  Post vital signs: stable  Last Vitals:  Vitals Value Taken Time  BP 158/84 11/03/20 1114  Temp 36.2 C 11/03/20 1020  Pulse 58 11/03/20 1130  Resp 17 11/03/20 1130  SpO2 98 % 11/03/20 1130  Vitals shown include unvalidated device data.  Last Pain:  Vitals:   11/03/20 1100  TempSrc:   PainSc: 0-No pain         Complications: No complications documented.

## 2020-11-03 NOTE — Anesthesia Preprocedure Evaluation (Signed)
Anesthesia Evaluation  Patient identified by MRN, date of birth, ID band Patient awake    Reviewed: Allergy & Precautions, H&P , NPO status , Patient's Chart, lab work & pertinent test results, reviewed documented beta blocker date and time   Airway Mallampati: II  TM Distance: >3 FB Neck ROM: full    Dental  (+) Poor Dentition, Teeth Intact   Pulmonary sleep apnea and Continuous Positive Airway Pressure Ventilation , former smoker,    Pulmonary exam normal        Cardiovascular Exercise Tolerance: Poor + Peripheral Vascular Disease  negative cardio ROS Normal cardiovascular exam Rhythm:regular Rate:Normal     Neuro/Psych  Headaches,  Neuromuscular disease negative neurological ROS  negative psych ROS   GI/Hepatic negative GI ROS, Neg liver ROS, GERD  Medicated,  Endo/Other  diabetes, Well Controlled  Renal/GU negative Renal ROS     Musculoskeletal  (+) Arthritis , Osteoarthritis,    Abdominal   Peds  Hematology negative hematology ROS (+) anemia ,   Anesthesia Other Findings Past Medical History: No date: Arthritis AAA  01/02/2011: Cataract     Comment: Bilateral - s/p excision 2000: Colon polyps     Comment: Hyperplastic - Repeat colonoscopy 2015 BPH Peripheral Neuropathy No date: GERD (gastroesophageal reflux disease)     Comment: Pantoprazole 2006: Hemorrhoids No date: HLD (hyperlipidemia) 03/20/2006: Iron deficiency anemia     Comment: Donating too much blood No date: Migraine     Comment: No longer has problems No date: Osteopenia     Comment: on Fosamax 03/20/2006: RLS (restless legs syndrome)     Comment: 2/2 iron deficiency No date: Schatzki's ring 01/04/2012: Skin cancer     Comment: Basal cell nose, squamous cell scalp - Dr.               Ula Lingo Past Surgical History: No date: BREAST BIOPSY Left     Comment: benign No date: Cataract Surgery Bilateral No date: INGUINAL HERNIA REPAIR  Left No date: ROTATOR CUFF REPAIR Left BMI    Body Mass Index:  28.06 kg/m     Reproductive/Obstetrics negative OB ROS                             Anesthesia Physical  Anesthesia Plan  ASA: III  Anesthesia Plan: General   Post-op Pain Management:    Induction: Intravenous  PONV Risk Score and Plan:   Airway Management Planned: Oral ETT  Additional Equipment:   Intra-op Plan:   Post-operative Plan:   Informed Consent: I have reviewed the patients History and Physical, chart, labs and discussed the procedure including the risks, benefits and alternatives for the proposed anesthesia with the patient or authorized representative who has indicated his/her understanding and acceptance.     Dental Advisory Given  Plan Discussed with: CRNA  Anesthesia Plan Comments:         Anesthesia Quick Evaluation

## 2020-11-03 NOTE — Progress Notes (Signed)
Informed Dr. Delana Meyer Left PAD groin bleeding.  Advised to hold pressure for 15 minutes.  Pt tolerating well.  NAD.  VSS  Will continue to monitor

## 2020-11-03 NOTE — Plan of Care (Signed)
Patient just arrived to the unit therefore he has not met criteria for understanding of all of these educational points.

## 2020-11-03 NOTE — Interval H&P Note (Signed)
History and Physical Interval Note:  11/03/2020 7:38 AM  Jamie Zhang  has presented today for surgery, with the diagnosis of Endovascular AAA stent  GORE  ANESTHESIA    AAA repair  Dr Lucky Cowboy w Dr Delana Meyer to assist  Covid  Dec 20.  The various methods of treatment have been discussed with the patient and family. After consideration of risks, benefits and other options for treatment, the patient has consented to  Procedure(s): ENDOVASCULAR REPAIR/STENT GRAFT (N/A) as a surgical intervention.  The patient's history has been reviewed, patient examined, no change in status, stable for surgery.  I have reviewed the patient's chart and labs.  Questions were answered to the patient's satisfaction.     Leotis Pain

## 2020-11-03 NOTE — Progress Notes (Signed)
Kristi RN from vascular here to inject lidocaine with epi to left groin site and new dressing applied

## 2020-11-03 NOTE — Progress Notes (Signed)
Pt feeling hot, throat feels funny,after lidocaine and epi.. Dr Lucky Cowboy aware, no new symtpoms , pt feeling better

## 2020-11-03 NOTE — Progress Notes (Signed)
Patients left groin bleeding under PAD catheter, Dr Delana Meyer aware and stated to place a pressure dressing,PAD catheter taken off after 40cc air released,pressure held on groin and pressure dressing applied.

## 2020-11-04 ENCOUNTER — Encounter: Payer: Self-pay | Admitting: Vascular Surgery

## 2020-11-04 LAB — CBC
HCT: 42.1 % (ref 39.0–52.0)
Hemoglobin: 14.4 g/dL (ref 13.0–17.0)
MCH: 31.9 pg (ref 26.0–34.0)
MCHC: 34.2 g/dL (ref 30.0–36.0)
MCV: 93.3 fL (ref 80.0–100.0)
Platelets: 164 10*3/uL (ref 150–400)
RBC: 4.51 MIL/uL (ref 4.22–5.81)
RDW: 13.2 % (ref 11.5–15.5)
WBC: 14.1 10*3/uL — ABNORMAL HIGH (ref 4.0–10.5)
nRBC: 0 % (ref 0.0–0.2)

## 2020-11-04 LAB — BASIC METABOLIC PANEL
Anion gap: 9 (ref 5–15)
BUN: 15 mg/dL (ref 8–23)
CO2: 26 mmol/L (ref 22–32)
Calcium: 8.9 mg/dL (ref 8.9–10.3)
Chloride: 105 mmol/L (ref 98–111)
Creatinine, Ser: 1.06 mg/dL (ref 0.61–1.24)
GFR, Estimated: >60 mL/min (ref >60)
Glucose, Bld: 129 mg/dL — ABNORMAL HIGH (ref 70–99)
Potassium: 4.6 mmol/L (ref 3.5–5.1)
Sodium: 140 mmol/L (ref 135–145)

## 2020-11-04 MED ORDER — FAMOTIDINE 20 MG PO TABS
ORAL_TABLET | ORAL | Status: AC
  Administered 2020-11-04: 09:00:00 40 mg via ORAL
  Filled 2020-11-04: qty 1

## 2020-11-04 MED ORDER — FAMOTIDINE 20 MG PO TABS
ORAL_TABLET | ORAL | Status: AC
  Filled 2020-11-04: qty 1

## 2020-11-04 NOTE — Progress Notes (Addendum)
0805:  Removed 14ml of air from PADs bilaterally. Patient tolerated well. No new drainage noted.   0920: Removed 70ml of air from PADs bilaterally. Patient tolerated well. No new drainage noted.   1026: Removed 69ml of air from PADs bilaterally. Patient tolerated well. No new drainage noted.   1130: Removed 27ml of air from PADs bilaterally. Patient tolerated well. No new drainage noted.   1230: PAD dressings removed bilaterally. Patient tolerated well. No new bleeding at site.  Sites dressed with gauze and paper tape.

## 2020-11-04 NOTE — Progress Notes (Signed)
[facility]  Daily Progress Note   Subjective  - 1 Day Post-Op  Patient states he is not having any pain  Objective Vitals:   11/04/20 0016 11/04/20 0216 11/04/20 0416 11/04/20 0616  BP: 140/81 (!) 144/85 (!) 150/82 (!) 146/89  Pulse: (!) 58 60 (!) 59 60  Resp: 12 14 10 12   Temp:  98.1 F (36.7 C)    TempSrc:  Temporal    SpO2: 94% 94% 94% 93%  Weight:      Height:        Intake/Output Summary (Last 24 hours) at 11/04/2020 0931 Last data filed at 11/04/2020 0753 Gross per 24 hour  Intake 1171.94 ml  Output 3825 ml  Net -2653.06 ml    PULM  Normal effort , no use of accessory muscles CV  No JVD, RRR Abd      No distended, nontender VASC  both groins clean dry and intact patient is about 90% hospitalized the patient  Laboratory CBC    Component Value Date/Time   WBC 14.1 (H) 11/04/2020 0613   HGB 14.4 11/04/2020 0613   HGB 14.6 03/13/2014 1505   HCT 42.1 11/04/2020 0613   HCT 42.5 03/13/2014 1505   PLT 164 11/04/2020 0613   PLT 204 03/13/2014 1505    BMET    Component Value Date/Time   NA 140 11/04/2020 0613   NA 140 07/10/2018 0000   NA 140 03/13/2014 1505   K 4.6 11/04/2020 0613   K 3.8 03/13/2014 1505   CL 105 11/04/2020 0613   CL 108 (H) 03/13/2014 1505   CO2 26 11/04/2020 0613   CO2 25 03/13/2014 1505   GLUCOSE 129 (H) 11/04/2020 0613   GLUCOSE 153 (H) 03/13/2014 1505   BUN 15 11/04/2020 0613   BUN 17 07/10/2018 0000   BUN 24 (H) 03/13/2014 1505   CREATININE 1.06 11/04/2020 0613   CREATININE 1.15 03/13/2014 1505   CALCIUM 8.9 11/04/2020 0613   CALCIUM 9.1 03/13/2014 1505   GFRNONAA >60 11/04/2020 0613   GFRNONAA >60 03/13/2014 1505   GFRAA >60 03/13/2014 1505    Assessment/Planning: POD #1 status post endovascular aneurysm repair  Patient has done well and is fit for discharge.  He will be discharged home with follow-up in the office in 2 weeks    Hortencia Pilar  11/04/2020, 9:31 AM

## 2020-11-04 NOTE — Progress Notes (Signed)
Nsg Discharge Note  Admit Date:  11/03/2020 Discharge date: 11/04/2020   Merlene Laughter to be D/C'd Home per MD order.  AVS completed.  Copy for chart, and copy for patient signed, and dated. Patient/caregiver able to verbalize understanding.  Discharge Medication:   Discharge Assessment: Vitals:   11/04/20 1031 11/04/20 1218  BP: (!) 154/79 (!) 162/88  Pulse: 65 62  Resp: 20 18  Temp:  98 F (36.7 C)  SpO2: 95% 94%   Skin clean, dry and intact without evidence of skin break down, no evidence of skin tears noted. IV catheter discontinued intact. Site without signs and symptoms of complications - no redness or edema noted at insertion site, patient denies c/o pain - only slight tenderness at site.  Dressing with slight pressure applied.  D/c Instructions-Education: Discharge instructions given to patient/family with verbalized understanding. D/c education completed with patient/family including follow up instructions, medication list, d/c activities limitations if indicated, with other d/c instructions as indicated by MD - patient able to verbalize understanding, all questions fully answered. Patient instructed to return to ED, call 911, or call MD for any changes in condition.  Patient escorted via [place], and D/C home via private auto.  Tresa Endo, RN 11/04/2020 12:34 PM

## 2020-11-10 ENCOUNTER — Ambulatory Visit: Payer: Medicare Other

## 2020-11-15 ENCOUNTER — Other Ambulatory Visit (INDEPENDENT_AMBULATORY_CARE_PROVIDER_SITE_OTHER): Payer: Self-pay | Admitting: Vascular Surgery

## 2020-11-15 DIAGNOSIS — Z8679 Personal history of other diseases of the circulatory system: Secondary | ICD-10-CM

## 2020-11-15 DIAGNOSIS — I714 Abdominal aortic aneurysm, without rupture, unspecified: Secondary | ICD-10-CM

## 2020-11-17 DIAGNOSIS — Z20828 Contact with and (suspected) exposure to other viral communicable diseases: Secondary | ICD-10-CM | POA: Diagnosis not present

## 2020-11-17 NOTE — Discharge Summary (Signed)
[facility]    Discharge Summary    Patient ID:  Jamie Zhang MRN: [id number] DOB/AGE: Jul 19, 1941 80 y.o.  Admit date: 11/03/2020 Discharge date: 11/17/2020 Date of Surgery: 11/03/2020 Surgeon: Surgeon(s): Estelita Iten, Latina Craver, MD Annice Needy, MD  Admission Diagnosis: AAA (abdominal aortic aneurysm) without rupture [facility]) [I71.4]  Discharge Diagnoses:  AAA (abdominal aortic aneurysm) without rupture [facility]) [I71.4]  Secondary Diagnoses: Past Medical History:  Diagnosis Date  . Arthritis   . Cataract 01/02/2011   Bilateral - s/p excision  . Colon polyps 2000   Hyperplastic - Repeat colonoscopy 2015  . GERD (gastroesophageal reflux disease)    Pantoprazole  . Hemorrhoids 2006  . HLD (hyperlipidemia)   . Iron deficiency anemia 03/20/2006   Donating too much blood  . Migraine    No longer has problems  . Osteopenia    on Fosamax  . RLS (restless legs syndrome) 03/20/2006   2/2 iron deficiency  . Schatzki's ring   . Skin cancer 01/04/2012   Basal cell nose, squamous cell scalp - Dr. Herma Ard  . Sleep apnea     Procedure(s): ENDOVASCULAR REPAIR/STENT GRAFT  Discharged Condition: good  HPI:  Patient is 1 day status post endovascular aneurysm repair.  Is sitting up.  He is doing well with no complaints.  Hospital Course:  Jamie Zhang is a 80 y.o. male is S/P Neither Procedure(s): ENDOVASCULAR REPAIR/STENT GRAFT Extubated: POD # 0 Physical exam: Groins are clean dry and intact no evidence of hematoma. Post-op wounds clean, dry, intact or healing well Pt. Ambulating, voiding and taking PO diet without difficulty. Pt pain controlled with PO pain meds. Labs as below Complications:none  Consults:    Significant Diagnostic Studies: CBC Lab Results  Component Value Date   WBC 14.1 (H) 11/04/2020   HGB 14.4 11/04/2020   HCT 42.1 11/04/2020   MCV 93.3 11/04/2020   PLT 164 11/04/2020    BMET    Component Value Date/Time   NA 140  11/04/2020 0613   NA 140 07/10/2018 0000   NA 140 03/13/2014 1505   K 4.6 11/04/2020 0613   K 3.8 03/13/2014 1505   CL 105 11/04/2020 0613   CL 108 (H) 03/13/2014 1505   CO2 26 11/04/2020 0613   CO2 25 03/13/2014 1505   GLUCOSE 129 (H) 11/04/2020 0613   GLUCOSE 153 (H) 03/13/2014 1505   BUN 15 11/04/2020 0613   BUN 17 07/10/2018 0000   BUN 24 (H) 03/13/2014 1505   CREATININE 1.06 11/04/2020 0613   CREATININE 1.15 03/13/2014 1505   CALCIUM 8.9 11/04/2020 0613   CALCIUM 9.1 03/13/2014 1505   GFRNONAA >60 11/04/2020 0613   GFRNONAA >60 03/13/2014 1505   GFRAA >60 03/13/2014 1505   COAG Lab Results  Component Value Date   INR 1.1 11/01/2020     Disposition:  Discharge to :Home Discharge Instructions    Call MD for:  redness, tenderness, or signs of infection (pain, swelling, bleeding, redness, odor or green/yellow discharge around incision site)   Complete by: As directed    Call MD for:  severe or increased pain, loss or decreased feeling  in affected limb(s)   Complete by: As directed    Call MD for:  temperature >100.5   Complete by: As directed    Discharge instructions   Complete by: As directed    OK to shower   Place Band Aids to both groins daily as needed   Driving Restrictions  Complete by: As directed    No driving for 1 week   Lifting restrictions   Complete by: As directed    No lifting for 2 weeks   No dressing needed   Complete by: As directed    Replace only if drainage present   Resume previous diet   Complete by: As directed      Allergies as of 11/04/2020   No Known Allergies     Medication List    TAKE these medications   aspirin EC 81 MG tablet Take 81 mg by mouth daily.   atorvastatin 20 MG tablet Commonly known as: LIPITOR TAKE 1 TABLET(20 MG) BY MOUTH DAILY What changed: See the new instructions.   CITRACAL + D PO Take 1 tablet by mouth daily.   diclofenac 75 MG EC tablet Commonly known as: VOLTAREN Take 1 tablet (75 mg  total) by mouth 2 (two) times daily. What changed:   when to take this  reasons to take this   docusate sodium 100 MG capsule Commonly known as: COLACE Take 100 mg by mouth every other day.   famotidine 40 MG tablet Commonly known as: PEPCID Take 1 tablet (40 mg total) by mouth daily.   fluticasone 50 MCG/ACT nasal spray Commonly known as: FLONASE Place 2 sprays into both nostrils daily. What changed: when to take this   magnesium oxide 400 MG tablet Commonly known as: MAG-OX Take 400 mg by mouth daily.   meclizine 12.5 MG tablet Commonly known as: ANTIVERT TAKE 1 TABLET THREE TIMES DAILY AS NEEDED FOR DIZZINESS What changed: See the new instructions.   Vitamin D3 250 MCG (10000 UT) Tabs Take 10,000 Units by mouth daily.      Verbal and written Discharge instructions given to the patient. Wound care per Discharge AVS  Follow-up Information    Dew, Erskine Squibb, MD Follow up in 2 week(s).   Specialties: Vascular Surgery, Radiology, Interventional Cardiology Why: with ABI's Contact information: [place] [id number] [phone]               Signed: Hortencia Pilar, MD  11/17/2020, 3:31 PM

## 2020-11-18 ENCOUNTER — Encounter (INDEPENDENT_AMBULATORY_CARE_PROVIDER_SITE_OTHER): Payer: Medicare Other

## 2020-11-18 ENCOUNTER — Ambulatory Visit (INDEPENDENT_AMBULATORY_CARE_PROVIDER_SITE_OTHER): Payer: Medicare Other | Admitting: Nurse Practitioner

## 2020-11-19 DIAGNOSIS — Z20828 Contact with and (suspected) exposure to other viral communicable diseases: Secondary | ICD-10-CM | POA: Diagnosis not present

## 2020-11-25 ENCOUNTER — Ambulatory Visit (INDEPENDENT_AMBULATORY_CARE_PROVIDER_SITE_OTHER): Payer: Medicare Other | Admitting: Nurse Practitioner

## 2020-11-25 ENCOUNTER — Ambulatory Visit (INDEPENDENT_AMBULATORY_CARE_PROVIDER_SITE_OTHER): Payer: Medicare Other

## 2020-11-25 ENCOUNTER — Encounter (INDEPENDENT_AMBULATORY_CARE_PROVIDER_SITE_OTHER): Payer: Self-pay | Admitting: Nurse Practitioner

## 2020-11-25 ENCOUNTER — Other Ambulatory Visit: Payer: Self-pay

## 2020-11-25 VITALS — BP 196/92 | HR 62 | Resp 16 | Wt 206.0 lb

## 2020-11-25 DIAGNOSIS — I700 Atherosclerosis of aorta: Secondary | ICD-10-CM

## 2020-11-25 DIAGNOSIS — Z9889 Other specified postprocedural states: Secondary | ICD-10-CM | POA: Diagnosis not present

## 2020-11-25 DIAGNOSIS — I714 Abdominal aortic aneurysm, without rupture, unspecified: Secondary | ICD-10-CM

## 2020-11-25 DIAGNOSIS — Z8679 Personal history of other diseases of the circulatory system: Secondary | ICD-10-CM | POA: Diagnosis not present

## 2020-11-25 DIAGNOSIS — E781 Pure hyperglyceridemia: Secondary | ICD-10-CM

## 2020-11-27 ENCOUNTER — Other Ambulatory Visit: Payer: Self-pay | Admitting: Internal Medicine

## 2020-12-01 ENCOUNTER — Encounter (INDEPENDENT_AMBULATORY_CARE_PROVIDER_SITE_OTHER): Payer: Self-pay | Admitting: Nurse Practitioner

## 2020-12-01 NOTE — Progress Notes (Signed)
Subjective:    Patient ID: Jamie Zhang, male    DOB: 08-May-1941, 80 y.o.   MRN: [id number] Chief Complaint  Patient presents with  . Follow-up    [facility] 2week post AAA    The patient returns to the office for surveillance of an abdominal aortic aneurysm status post stent graft placement on 11/03/20.   The patient underwent intervention including:  PROCEDURE: 1. US guidance for vascular access, bilateral femoral arteries 2. Catheter placement into aorta from bilateral femoral approaches 3. Placement of a 31 x 14 x 13 Gore Excluder Endoprosthesis main body with a 23 x 10 extender and a 20 x 12 contralateral limb 4. Selective injection of the superior right renal artery first order catheter placement 5. Coil embolization of the inferior right renal artery with first-order catheter placement. 6. ProGlide closure devices bilateral femoral arteries   Patient denies abdominal pain or back pain, no other abdominal complaints. No groin related complaints. No symptoms consistent with distal embolization No changes in claudication distance.   There have been no interval changes in his overall healthcare since his last visit.  The patient notes that he had very little recovery time and healed well following surgery  Patient denies amaurosis fugax or TIA symptoms. There is no history of claudication or rest pain symptoms of the lower extremities. The patient denies angina or shortness of breath.   Today noninvasive studies show an ABI of 1.14 on the right and 1.11 on the left.  The patient has biphasic/triphasic waveform arteries seen in the bilateral tibial arteries.   Review of Systems     Objective:   Physical Exam  BP (!) 196/92 (BP Location: Left Arm)   Pulse 62   Resp 16   Wt 206 lb (93.4 kg)   BMI 30.42 kg/m   Past Medical History:  Diagnosis Date  . Arthritis   . Cataract 01/02/2011   Bilateral - s/p excision  . Colon polyps 2000   Hyperplastic - Repeat colonoscopy 2015   . GERD (gastroesophageal reflux disease)    Pantoprazole  . Hemorrhoids 2006  . HLD (hyperlipidemia)   . Iron deficiency anemia 03/20/2006   Donating too much blood  . Migraine    No longer has problems  . Osteopenia    on Fosamax  . RLS (restless legs syndrome) 03/20/2006   2/2 iron deficiency  . Schatzki's ring   . Skin cancer 01/04/2012   Basal cell nose, squamous cell scalp - Dr. Ula Lingo  . Sleep apnea     Social History   Socioeconomic History  . Marital status: Married    Spouse name: Not on file  . Number of children: 2  . Years of education: 46  . Highest education level: Not on file  Occupational History  . Occupation: Freight forwarder    Comment: Worked for [employer]  . Occupation: Personal assistant  . Occupation: Freight forwarder for [employer]  Tobacco Use  . Smoking status: Former Smoker    Packs/day: 2.00    Years: 21.00    Pack years: 42.00    Types: Cigarettes    Quit date: 1980    Years since quitting: 42.0  . Smokeless tobacco: Never Used  Vaping Use  . Vaping Use: Never used  Substance and Sexual Activity  . Alcohol use: Yes    Comment: Occasional wine glass  . Drug use: No  . Sexual activity: Never  Other Topics Concern  . Not on file  Social History  Narrative   Mr. Evener grew up in [place], [place]. He joined the [employer] right out of [employer] and served for 2.5 years then was active in [employer]. 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 He then worked in [employer] and afterward became a Freight forwarder for [employer]. He recently moved to [place] with his wife. They have two daughters that are still living in [place]. As a hobby he enjoys woodwork mainly with cabinet work.    Social Determinants of Health   Financial Resource Strain: Not on file  Food Insecurity: Not on file  Transportation Needs: Not on file  Physical Activity: Not on file  Stress: Not on file  Social Connections: Not on file   Intimate Partner Violence: Not on file    Past Surgical History:  Procedure Laterality Date  . BREAST BIOPSY Left    benign  . Cataract Surgery Bilateral   . COLONOSCOPY WITH PROPOFOL N/A 04/18/2017   Procedure: COLONOSCOPY WITH PROPOFOL;  Surgeon: Robert Bellow, MD;  Location: [facility] ENDOSCOPY;  Service: Endoscopy;  Laterality: N/A;  . ENDOVASCULAR REPAIR/STENT GRAFT N/A 11/03/2020   Procedure: ENDOVASCULAR REPAIR/STENT GRAFT;  Surgeon: Algernon Huxley, MD;  Location: [place] [facility];  Service: Cardiovascular;  Laterality: N/A;  . INGUINAL HERNIA REPAIR Left   . ROTATOR CUFF REPAIR Left     Family History  Problem Relation Age of Onset  . Alcohol abuse Mother   . Arthritis Mother   . Hypertension Mother   . Cancer Father 53       Throat Cancer - died  . Alcohol abuse Father   . Cancer Sister 74       Breast Cancer  . Alcohol abuse Sister     No Known Allergies  CBC Latest Ref Rng & Units 11/04/2020 11/01/2020 07/27/2020  WBC 4.0 - 10.5 K/uL 14.1(H) 7.1 7.4  Hemoglobin 13.0 - 17.0 g/dL 14.4 15.1 15.2  Hematocrit 39.0 - 52.0 % 42.1 43.8 45.1  Platelets 150 - 400 K/uL 164 176 171.0      CMP     Component Value Date/Time   NA 140 11/04/2020 0613   NA 140 07/10/2018 0000   NA 140 03/13/2014 1505   K 4.6 11/04/2020 0613   K 3.8 03/13/2014 1505   CL 105 11/04/2020 0613   CL 108 (H) 03/13/2014 1505   CO2 26 11/04/2020 0613   CO2 25 03/13/2014 1505   GLUCOSE 129 (H) 11/04/2020 0613   GLUCOSE 153 (H) 03/13/2014 1505   BUN 15 11/04/2020 0613   BUN 17 07/10/2018 0000   BUN 24 (H) 03/13/2014 1505   CREATININE 1.06 11/04/2020 0613   CREATININE 1.15 03/13/2014 1505   CALCIUM 8.9 11/04/2020 0613   CALCIUM 9.1 03/13/2014 1505   PROT 6.6 07/26/2020 1130   PROT 7.1 03/13/2014 1505   ALBUMIN 4.6 07/26/2020 1130   ALBUMIN 3.8 03/13/2014 1505   AST 20 07/26/2020 1130   AST 21 03/13/2014 1505   ALT 28 07/26/2020 1130   ALT 38 03/13/2014 1505   ALKPHOS 55 07/26/2020  1130   ALKPHOS 53 03/13/2014 1505   BILITOT 1.8 (H) 07/26/2020 1130   BILITOT 1.5 (H) 03/13/2014 1505   GFRNONAA >60 11/04/2020 0613   GFRNONAA >60 03/13/2014 1505   GFRAA >60 03/13/2014 1505     VAS [place] ABI WITH/WO TBI  Result Date: 11/25/2020 LOWER EXTREMITY DOPPLER STUDY Indications: S/P EVAR.  Vascular  Interventions: EVAR with iliac extender on 11/03/20. Performing Technologist: Blondell Reveal RT, RDMS, RVT  Examination Guidelines: A complete evaluation includes at minimum, Doppler waveform signals and systolic blood pressure reading at the level of bilateral brachial, anterior tibial, and posterior tibial arteries, when vessel segments are accessible. Bilateral testing is considered an integral part of a complete examination. Photoelectric Plethysmograph (PPG) waveforms and toe systolic pressure readings are included as required and additional duplex testing as needed. Limited examinations for reoccurring indications may be performed as noted.  ABI Findings: +---------+------------------+-----+-------------+------------------+ Right    Rt Pressure (mmHg)IndexWaveform     Comment            +---------+------------------+-----+-------------+------------------+ Brachial 177                                                    +---------+------------------+-----+-------------+------------------+ ATA                             bidirectionaldetected by duplex +---------+------------------+-----+-------------+------------------+ PTA      207               1.14 triphasic                       +---------+------------------+-----+-------------+------------------+ PERO     170               0.93 biphasic                        +---------+------------------+-----+-------------+------------------+ Great Toe171               0.94 Normal                          +---------+------------------+-----+-------------+------------------+  +---------+------------------+-----+-------------------+---------+ Left     Lt Pressure (mmHg)IndexWaveform           Comment   +---------+------------------+-----+-------------------+---------+ Brachial 182                                                 +---------+------------------+-----+-------------------+---------+ ATA      199               1.09 biphasic/retrograde          +---------+------------------+-----+-------------------+---------+ PTA      202               1.11 triphasic                    +---------+------------------+-----+-------------------+---------+ PERO                            biphasic           by duplex +---------+------------------+-----+-------------------+---------+ Great Toe158               0.87 Normal                       +---------+------------------+-----+-------------------+---------+  Summary: Bilateral: Bilateral ankle-brachial indexes are within normal range. 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 Bilateral toe-brachial indexes are within normal range.  *See table(s)  above for measurements and observations.  Electronically signed by Hortencia Pilar MD on 11/25/2020 at 4:39:02 PM.   Final        Assessment & Plan:   1. AAA (abdominal aortic aneurysm) without rupture (HCC) Recommend: Patient is status post successful endovascular repair of the AAA.   No further intervention is required at this time.   No endoleak is detected and the aneurysm sac is stable.  The patient will continue antiplatelet therapy as prescribed as well as aggressive management of hyperlipidemia. Exercise is again strongly encouraged.   However, endografts require continued surveillance with ultrasound or CT scan. This is mandatory to detect any changes that allow repressurization of the aneurysm sac.  The patient is informed that this would be asymptomatic.  The patient is reminded that lifelong routine surveillance is a necessity with an  endograft. Patient will continue to follow-up at 3 month months  with ultrasound of the aorta.  2. Pure hypertriglyceridemia Continue statin as ordered and reviewed, no changes at this time   3. 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  The patient does not voice lifestyle limiting changes at this point in time.  Noninvasive studies do not suggest clinically significant change.  No invasive studies, angiography or surgery at this time The patient should continue walking and begin a more formal exercise program.  The patient should continue antiplatelet therapy and aggressive treatment of the lipid abnormalities  No changes in the patient's medications at this time  The patient should continue wearing graduated compression socks 10-15 mmHg strength to control the mild edema.     Current Outpatient Medications on File Prior to Visit  Medication Sig Dispense Refill  . aspirin EC 81 MG tablet Take 81 mg by mouth daily.    . Calcium Citrate-Vitamin D (CITRACAL + D PO) Take 1 tablet by mouth daily.    . Cholecalciferol (VITAMIN D3) 10000 UNITS TABS Take 10,000 Units by mouth daily.    . diclofenac (VOLTAREN) 75 MG EC tablet Take 1 tablet (75 mg total) by mouth 2 (two) times daily. (Patient taking differently: Take 75 mg by mouth 2 (two) times daily as needed for moderate pain.) 60 tablet 2  . famotidine (PEPCID) 40 MG tablet Take 1 tablet (40 mg total) by mouth daily. 90 tablet 3  . fluticasone (FLONASE) 50 MCG/ACT nasal spray Place 2 sprays into both nostrils daily. (Patient taking differently: Place 2 sprays into both nostrils at bedtime.) 48 g 2  . meclizine (ANTIVERT) 12.5 MG tablet TAKE 1 TABLET THREE TIMES DAILY AS NEEDED FOR DIZZINESS (Patient taking differently: Take 12.5 mg by mouth 3 (three) times daily as needed for dizziness.) 90 tablet 0  . docusate sodium (COLACE) 100 MG capsule Take 100  mg by mouth every other day.    . magnesium oxide (MAG-OX) 400 MG tablet Take 400 mg by mouth daily.     No current facility-administered medications on file prior to visit.    There are no Patient Instructions on file for this visit. No follow-ups on file.   Kris Hartmann, NP

## 2020-12-02 ENCOUNTER — Encounter (INDEPENDENT_AMBULATORY_CARE_PROVIDER_SITE_OTHER): Payer: Self-pay

## 2020-12-02 DIAGNOSIS — I150 Renovascular hypertension: Secondary | ICD-10-CM

## 2020-12-02 MED ORDER — LOSARTAN POTASSIUM 50 MG PO TABS: 50.0000 mg | ORAL_TABLET | Freq: Every day | ORAL | 0 refills | Status: DC

## 2020-12-02 NOTE — Telephone Encounter (Signed)
Patient needs lab appt next week and OV  The following week

## 2020-12-03 ENCOUNTER — Telehealth: Payer: Self-pay | Admitting: Internal Medicine

## 2020-12-03 NOTE — Telephone Encounter (Signed)
Lm with patient's wife to have patient call and make a lab appointment followed 1w later OV with [place].

## 2020-12-07 ENCOUNTER — Other Ambulatory Visit: Payer: Self-pay

## 2020-12-07 ENCOUNTER — Other Ambulatory Visit (INDEPENDENT_AMBULATORY_CARE_PROVIDER_SITE_OTHER): Payer: Medicare Other

## 2020-12-07 DIAGNOSIS — I150 Renovascular hypertension: Secondary | ICD-10-CM | POA: Diagnosis not present

## 2020-12-08 LAB — BASIC METABOLIC PANEL
BUN/Creatinine Ratio: 16 (ref 10–24)
BUN: 19 mg/dL (ref 8–27)
CO2: 26 mmol/L (ref 20–29)
Calcium: 10 mg/dL (ref 8.6–10.2)
Chloride: 105 mmol/L (ref 96–106)
Creatinine, Ser: 1.18 mg/dL (ref 0.76–1.27)
GFR calc Af Amer: 67 mL/min/{1.73_m2} (ref >59)
GFR calc non Af Amer: 58 mL/min/{1.73_m2} — ABNORMAL LOW (ref >59)
Glucose: 93 mg/dL (ref 65–99)
Potassium: 4.5 mmol/L (ref 3.5–5.2)
Sodium: 143 mmol/L (ref 134–144)

## 2020-12-10 ENCOUNTER — Encounter (INDEPENDENT_AMBULATORY_CARE_PROVIDER_SITE_OTHER): Payer: Self-pay

## 2020-12-15 ENCOUNTER — Ambulatory Visit: Payer: Medicare Other | Admitting: Podiatry

## 2020-12-20 ENCOUNTER — Encounter: Payer: Self-pay | Admitting: Podiatry

## 2020-12-20 ENCOUNTER — Ambulatory Visit (INDEPENDENT_AMBULATORY_CARE_PROVIDER_SITE_OTHER): Payer: Medicare Other

## 2020-12-20 ENCOUNTER — Ambulatory Visit (INDEPENDENT_AMBULATORY_CARE_PROVIDER_SITE_OTHER): Payer: Medicare Other | Admitting: Podiatry

## 2020-12-20 ENCOUNTER — Other Ambulatory Visit: Payer: Self-pay

## 2020-12-20 DIAGNOSIS — M722 Plantar fascial fibromatosis: Secondary | ICD-10-CM

## 2020-12-20 MED ORDER — TRIAMCINOLONE ACETONIDE 10 MG/ML IJ SUSP: 10.0000 mg | Freq: Once | INTRAMUSCULAR | Status: AC

## 2020-12-20 MED ORDER — DEXAMETHASONE SODIUM PHOSPHATE 4 MG/ML IJ SOLN: 4.0000 mg | Freq: Once | INTRAMUSCULAR | Status: AC

## 2020-12-20 NOTE — Progress Notes (Signed)
  Subjective:  Patient ID: Jamie Zhang, male    DOB: Dec 04, 1940,  MRN: [id number]  Chief Complaint  Patient presents with  . Foot Pain    Patient presents today for bilat heel pain x 3-4 weeks    80 y.o. male presents with the above complaint. History confirmed with patient.  Recently purchased new shoes, the shoe salesman as well as his wife and recommend that he try shoes with a better and higher arch support.  He thinks this made this worse and initiated the pain.  Has been improving slightly over the last couple weeks.  The right side is worse.  Objective:  Physical Exam: warm, good capillary refill, no trophic changes or ulcerative lesions, normal DP and PT pulses and normal sensory exam. 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 Plantar fasciitis      Plan:  Patient was evaluated and treated and all questions answered.  Discussed the etiology and treatment options for plantar fasciitis including stretching, formal physical therapy, supportive shoegears such as a running shoe or sneaker, pre fabricated orthoses, injection therapy, and oral medications. We also discussed the role of surgical treatment of this for patients who do not improve after exhausting non-surgical treatment options.    -XR reviewed with patient -Educated patient on stretching and icing of the affected limb -Injection delivered to the plantar fascia of the right foot.  Left foot did not need injection -Advised to wear his most comfortable shoes, hopefully this will resolve quite soon -Tylenol and OTC NSAID as needed.  Prefer not to send him on a standing dose of meloxicam due to his age and cardiac risk factors -If not improved by next visit consider physical therapy  referral  After sterile prep with povidone-iodine solution and alcohol, the right heel was injected with 0.5cc 2% xylocaine plain, 0.5cc 0.5% marcaine plain, 5mg  triamcinolone acetonide, and 2mg  dexamethasone was injected along  the plantar fascia at the insertion on the plantar calcaneus. The patient tolerated the procedure well without complication.  Return in about 1 month (around 01/17/2021).

## 2020-12-20 NOTE — Patient Instructions (Signed)

## 2020-12-30 ENCOUNTER — Telehealth: Payer: Self-pay | Admitting: Internal Medicine

## 2020-12-30 NOTE — Telephone Encounter (Signed)
Left message with household member for patient to call back and schedule Medicare Annual Wellness Visit (AWV)   This should be a virtual visit only=30 minutes.  Last AWV 11/10/19; please schedule at anytime with Denisa O'Brien-Blaney at [facility]   Can try to offer tomorrow 2/18 at 130pm if possible???

## 2021-01-03 ENCOUNTER — Other Ambulatory Visit: Payer: Self-pay

## 2021-01-03 ENCOUNTER — Encounter: Payer: Self-pay | Admitting: Internal Medicine

## 2021-01-03 ENCOUNTER — Ambulatory Visit (INDEPENDENT_AMBULATORY_CARE_PROVIDER_SITE_OTHER): Payer: Medicare Other | Admitting: Internal Medicine

## 2021-01-03 VITALS — BP 130/74 | HR 75 | Temp 98.0°F | Resp 14 | Ht 69.0 in | Wt 205.2 lb

## 2021-01-03 DIAGNOSIS — R419 Unspecified symptoms and signs involving cognitive functions and awareness: Secondary | ICD-10-CM | POA: Diagnosis not present

## 2021-01-03 DIAGNOSIS — I714 Abdominal aortic aneurysm, without rupture, unspecified: Secondary | ICD-10-CM

## 2021-01-03 DIAGNOSIS — R519 Headache, unspecified: Secondary | ICD-10-CM | POA: Diagnosis not present

## 2021-01-03 DIAGNOSIS — R4189 Other symptoms and signs involving cognitive functions and awareness: Secondary | ICD-10-CM | POA: Diagnosis not present

## 2021-01-03 DIAGNOSIS — I10 Essential (primary) hypertension: Secondary | ICD-10-CM

## 2021-01-03 DIAGNOSIS — I700 Atherosclerosis of aorta: Secondary | ICD-10-CM

## 2021-01-03 LAB — BASIC METABOLIC PANEL
BUN: 22 mg/dL (ref 6–23)
CO2: 26 mEq/L (ref 19–32)
Calcium: 9.6 mg/dL (ref 8.4–10.5)
Chloride: 105 mEq/L (ref 96–112)
Creatinine, Ser: 1.09 mg/dL (ref 0.40–1.50)
GFR: 64 mL/min (ref >60.00)
Glucose, Bld: 129 mg/dL — ABNORMAL HIGH (ref 70–99)
Potassium: 3.8 mEq/L (ref 3.5–5.1)
Sodium: 141 mEq/L (ref 135–145)

## 2021-01-03 LAB — VITAMIN B12: Vitamin B-12: 411 pg/mL (ref 211–911)

## 2021-01-03 LAB — TSH: TSH: 1.81 u[IU]/mL (ref 0.35–4.50)

## 2021-01-03 MED ORDER — LOSARTAN POTASSIUM 50 MG PO TABS: 50.0000 mg | ORAL_TABLET | Freq: Every day | ORAL | 0 refills | Status: DC

## 2021-01-03 NOTE — Progress Notes (Signed)
Subjective:  Patient ID: Jamie Zhang, male    DOB: Apr 23, 1941  Age: 80 y.o. MRN: [id number]  CC: The primary encounter diagnosis was Cognitive complaints. Diagnoses of AAA (abdominal aortic aneurysm) without rupture ([place]), Atherosclerosis of aorta without gangrene ([place]), Primary hypertension, Cognitive changes, and Nocturnal headaches were also pertinent to this visit.  HPI Jamie Zhang presents for follow up on hypertension and new onset headaches .   This visit occurred during the SARS-CoV-2 public health emergency.  Safety protocols were in place, including screening questions prior to the visit, additional usage of staff PPE, and extensive cleaning of exam room while observing appropriate contact time as indicated for disinfecting solutions.    Recently he reported new onset daily headaches which started occurring after his endovascular AAA repair by Dr Lucky Cowboy in Dec 2021.  He was advised to follow up with me.  He started taking  losartan on 12/03/20 .  Home readings hve ranged from 126/70 to 152/81  With 2/3 being under 756 systolic. On of the days he took a dose of his wife's BP medication,  Can't recall the name.  This resulted in a  Discussion of the dangers of taking someone else's medication.  Headaches have now resolved.  He began using a  nasal spray available OTC for sinus congestion  To help him tolerate nightlt use of CPAP.   Due for follow up with [facility]  Notices increased burping only at night after eating dinner,  Before bedtime. Eats at 5:30 pm.  No reflux symptoms.    Discussed why this was NOT due to use of CPAP .  Diet discussed  Cognitive issues  Detected today. Mainly his inability to recall names,  Medications, etc.  He reports increased stress at home due to estrangement from one of their daughters.   Outpatient Medications Prior to Visit  Medication Sig Dispense Refill  . aspirin EC 81 MG tablet Take 81 mg by mouth daily.    [name] atorvastatin (LIPITOR) 20 MG tablet TAKE 1  TABLET(20 MG) BY MOUTH DAILY 90 tablet 1  . Calcium Citrate-Vitamin D (CITRACAL + D PO) Take 1 tablet by mouth daily.    . Cholecalciferol (VITAMIN D3) 10000 UNITS TABS Take 10,000 Units by mouth daily.    . diclofenac (VOLTAREN) 75 MG EC tablet Take 1 tablet (75 mg total) by mouth 2 (two) times daily. (Patient taking differently: Take 75 mg by mouth 2 (two) times daily as needed for moderate pain.) 60 tablet 2  . docusate sodium (COLACE) 100 MG capsule Take 100 mg by mouth every other day.    . famotidine (PEPCID) 40 MG tablet Take 1 tablet (40 mg total) by mouth daily. 90 tablet 3  . meclizine (ANTIVERT) 12.5 MG tablet TAKE 1 TABLET THREE TIMES DAILY AS NEEDED FOR DIZZINESS (Patient taking differently: Take 12.5 mg by mouth 3 (three) times daily as needed for dizziness.) 90 tablet 0  . losartan (COZAAR) 50 MG tablet Take 1 tablet (50 mg total) by mouth at bedtime. 90 tablet 0  . fluticasone (FLONASE) 50 MCG/ACT nasal spray Place 2 sprays into both nostrils daily. (Patient not taking: Reported on 01/03/2021) 48 g 2   Facility-Administered Medications Prior to Visit  Medication Dose Route Frequency Provider Last Rate Last Admin  . dexamethasone (DECADRON) injection 4 mg  4 mg Other Once Criselda Peaches, DPM      . triamcinolone acetonide (KENALOG) 10 MG/ML injection 10 mg  10 mg Other Once McDonald,  Stephan Minister, DPM        Review of Systems;  Patient denies headache, fevers, malaise, unintentional weight loss, skin rash, eye pain, sinus congestion and sinus pain, sore throat, dysphagia,  hemoptysis , cough, dyspnea, wheezing, chest pain, palpitations, orthopnea, edema, abdominal pain, nausea, melena, diarrhea, constipation, flank pain, dysuria, hematuria, urinary  Frequency, nocturia, numbness, tingling, seizures,  Focal weakness, Loss of consciousness,  Tremor, insomnia, depression, anxiety, and suicidal ideation.      Objective:  BP 130/74 (BP Location: Left Arm, Patient Position: Sitting, Cuff  Size: Normal)   Pulse 75   Temp 98 F (36.7 C) (Oral)   Resp 14   Ht 5\' 9"  (1.753 m)   Wt 205 lb 3.2 oz (93.1 kg)   SpO2 97%   BMI 30.30 kg/m   BP Readings from Last 3 Encounters:  01/03/21 130/74  11/25/20 (!) 196/92  11/04/20 (!) 162/88    Wt Readings from Last 3 Encounters:  01/03/21 205 lb 3.2 oz (93.1 kg)  11/25/20 206 lb (93.4 kg)  11/03/20 204 lb (92.5 kg)    General appearance: alert, cooperative and appears stated age Ears: normal TM's and external ear canals both ears Throat: lips, mucosa, and tongue normal; teeth and gums normal Neck: no adenopathy, no carotid bruit, supple, symmetrical, trachea midline and thyroid not enlarged, symmetric, no tenderness/mass/nodules Back: symmetric, no curvature. ROM normal. No CVA tenderness. Lungs: clear to auscultation bilaterally Heart: regular rate and rhythm, S1, S2 normal, no murmur, click, rub or gallop Abdomen: soft, non-tender; bowel sounds normal; no masses,  no organomegaly Pulses: 2+ and symmetric Skin: Skin color, texture, turgor normal. No rashes or lesions Lymph nodes: Cervical, supraclavicular, and axillary nodes normal.  Lab Results  Component Value Date   HGBA1C 6.0 07/26/2020   HGBA1C 5.7 08/08/2019   HGBA1C 5.8 01/24/2019    Lab Results  Component Value Date   CREATININE 1.09 01/03/2021   CREATININE 1.18 12/07/2020   CREATININE 1.06 11/04/2020    Lab Results  Component Value Date   WBC 14.1 (H) 11/04/2020   HGB 14.4 11/04/2020   HCT 42.1 11/04/2020   PLT 164 11/04/2020   GLUCOSE 129 (H) 01/03/2021   CHOL 111 07/26/2020   TRIG 89.0 07/26/2020   HDL 49.70 07/26/2020   LDLDIRECT 47.0 12/24/2015   LDLCALC 44 07/26/2020   ALT 28 07/26/2020   AST 20 07/26/2020   NA 141 01/03/2021   K 3.8 01/03/2021   CL 105 01/03/2021   CREATININE 1.09 01/03/2021   BUN 22 01/03/2021   CO2 26 01/03/2021   TSH 1.81 01/03/2021   PSA 1.19 08/08/2019   INR 1.1 11/01/2020   HGBA1C 6.0 07/26/2020    No  results found.  Assessment & Plan:   Problem List Items Addressed This Visit      Unprioritized   Nocturnal headaches    Advised to resume daily flonase and limit use of )(presumed) afrin on once daily at night       Hypertension    Continue losartan,  Increase from 25 to 50 mg daily.  Goal is 120/70 given AAA      Relevant Medications   losartan (COZAAR) 50 MG tablet   Cognitive changes    Screening labs done.  Return for MMSE  Lab Results  Component Value Date   TSH 1.81 01/03/2021   No results found for: RPR Lab Results  Component Value Date   VITAMINB12 411 01/03/2021  Atherosclerosis of aorta without gangrene [facility])    Reviewed findings of prior CT scan today..  Patient is tolerating high potency statin therapy   Lab Results  Component Value Date   CHOL 111 07/26/2020   HDL 49.70 07/26/2020   LDLCALC 44 07/26/2020   LDLDIRECT 47.0 12/24/2015   TRIG 89.0 07/26/2020   CHOLHDL 2 07/26/2020         Relevant Medications   losartan (COZAAR) 50 MG tablet   AAA (abdominal aortic aneurysm) without rupture [facility])    S/p endovascular repair by Leotis Pain in Dec 2021.  Reviewed BP readings with patient ;  Goal is < 130/80 so will increase losartan to 50 mg daily .        Relevant Medications   losartan (COZAAR) 50 MG tablet    Other Visit Diagnoses    Cognitive complaints    -  Primary   Relevant Orders   TSH (Completed)   Vitamin B12 (Completed)   RPR (Completed)   Basic metabolic panel (Completed)      I have discontinued Jamie Zhang's fluticasone. I am also having him maintain his Calcium Citrate-Vitamin D (CITRACAL + D PO), Vitamin D3, aspirin EC, meclizine, famotidine, diclofenac, docusate sodium, atorvastatin, and losartan. We will continue to administer dexamethasone and triamcinolone acetonide.  Meds ordered this encounter  Medications  . losartan (COZAAR) 50 MG tablet    Sig: Take 1 tablet (50 mg total) by mouth at bedtime.    Dispense:   90 tablet    Refill:  0   I provided  30 minutes of  face-to-face time during this encounter reviewing patient's current problems and past surgeries, labs and imaging studies, providing counseling on the above mentioned problems , and coordination  of care .  Medications Discontinued During This Encounter  Medication Reason  . fluticasone (FLONASE) 50 MCG/ACT nasal spray   . losartan (COZAAR) 50 MG tablet     Follow-up: Return in about 2 weeks (around 01/17/2021).   Crecencio Mc, MD

## 2021-01-03 NOTE — Patient Instructions (Addendum)
For sinus congestion/itching :  Avoid using afrin more than 5 days in a row without concurrent use of fluticasone (to avoid addiction to nasal decongestant).  Ok to use just at night as long as you are using fluticasone nasal spray    You are due for 1 year follow up on CPAP therapy with Dr [facility]   Your blood pressure is improved but could be lower:  tonight double your dose of losartan to 100 mg daily   ; IF TOLERATED,  CONTINUE 100 MG DAILY FOR ONE WEEK AND SEND ME THE READINGS   Goals Is 120/70 to 130/80     Your burping does not correlate with use of CPAP (timewise)  Try taking BEANO with dinner  Or any meal containing beans or cruciferous vegetables   Try taking Gas X or Phasyme for after meal gas   For constipation  reduce fiber  And increase use of colace .  You can rake 200 mg of colace , your stool softener daily   Magnesium may be stopped if you increase the colace  Return for memory testing in a few week

## 2021-01-04 DIAGNOSIS — R519 Headache, unspecified: Secondary | ICD-10-CM | POA: Insufficient documentation

## 2021-01-04 DIAGNOSIS — R4189 Other symptoms and signs involving cognitive functions and awareness: Secondary | ICD-10-CM | POA: Insufficient documentation

## 2021-01-04 DIAGNOSIS — I10 Essential (primary) hypertension: Secondary | ICD-10-CM | POA: Insufficient documentation

## 2021-01-04 LAB — RPR: RPR Ser Ql: NONREACTIVE

## 2021-01-04 NOTE — Assessment & Plan Note (Signed)
Advised to resume daily flonase and limit use of )(presumed) afrin on once daily at night

## 2021-01-04 NOTE — Assessment & Plan Note (Signed)
S/p endovascular repair by Leotis Pain in Dec 2021.  Reviewed BP readings with patient ;  Goal is < 130/80 so will increase losartan to 50 mg daily .

## 2021-01-04 NOTE — Assessment & Plan Note (Signed)
Continue losartan,  Increase from 25 to 50 mg daily.  Goal is 120/70 given AAA

## 2021-01-04 NOTE — Assessment & Plan Note (Signed)
Reviewed findings of prior CT scan today..  Patient is tolerating high potency statin therapy   Lab Results  Component Value Date   CHOL 111 07/26/2020   HDL 49.70 07/26/2020   LDLCALC 44 07/26/2020   LDLDIRECT 47.0 12/24/2015   TRIG 89.0 07/26/2020   CHOLHDL 2 07/26/2020

## 2021-01-04 NOTE — Assessment & Plan Note (Signed)
Screening labs done.  Return for MMSE  Lab Results  Component Value Date   TSH 1.81 01/03/2021   No results found for: RPR Lab Results  Component Value Date   VITAMINB12 411 01/03/2021

## 2021-01-06 ENCOUNTER — Ambulatory Visit (INDEPENDENT_AMBULATORY_CARE_PROVIDER_SITE_OTHER): Payer: Medicare Other

## 2021-01-06 VITALS — Ht 69.0 in | Wt 205.0 lb

## 2021-01-06 DIAGNOSIS — Z0000 Encounter for general adult medical examination without abnormal findings: Secondary | ICD-10-CM

## 2021-01-06 NOTE — Patient Instructions (Addendum)
Jamie Zhang , Thank you for taking time to come for your Medicare Wellness Visit. I appreciate your ongoing commitment to your health goals. Please review the following plan we discussed and let me know if I can assist you in the future.   These are the goals we discussed: Goals      Patient Stated   .  DIET - REDUCE SUGAR INTAKE (pt-stated)       This is a list of the screening recommended for you and due dates:  Health Maintenance  Topic Date Due  . Colon Cancer Screening  04/18/2020  . Tetanus Vaccine  08/08/2020  . Flu Shot  Completed  . COVID-19 Vaccine  Completed  . Pneumonia vaccines  Completed   Keep all routine maintenance appointments.   Follow up 02/01/21   Advanced directives: on file  Conditions/risks identified: none new  Follow up in one year for your annual wellness visit.   Preventive Care 81 Years and Older, Male Preventive care refers to lifestyle choices and visits with your health care provider that can promote health and wellness. What does preventive care include?  A yearly physical exam. This is also called an annual well check.  Dental exams once or twice a year.  Routine eye exams. Ask your health care provider how often you should have your eyes checked.  Personal lifestyle choices, including:  Daily care of your teeth and gums.  Regular physical activity.  Eating a healthy diet.  Avoiding tobacco and drug use.  Limiting alcohol use.  Practicing safe sex.  Taking low doses of aspirin every day.  Taking vitamin and mineral supplements as recommended by your health care provider. What happens during an annual well check? The services and screenings done by your health care provider during your annual well check will depend on your age, overall health, lifestyle risk factors, and family history of disease. Counseling  Your health care provider may ask you questions about your:  Alcohol use.  Tobacco use.  Drug use.  Emotional  well-being.  Home and relationship well-being.  Sexual activity.  Eating habits.  History of falls.  Memory and ability to understand (cognition).  Work and work Statistician. Screening  You may have the following tests or measurements:  Height, weight, and BMI.  Blood pressure.  Lipid and cholesterol levels. These may be checked every 5 years, or more frequently if you are over 66 years old.  Skin check.  Lung cancer screening. You may have this screening every year starting at age 66 if you have a 30-pack-year history of smoking and currently smoke or have quit within the past 15 years.  Fecal occult blood test (FOBT) of the stool. You may have this test every year starting at age 81.  Flexible sigmoidoscopy or colonoscopy. You may have a sigmoidoscopy every 5 years or a colonoscopy every 10 years starting at age 70.  Prostate cancer screening. Recommendations will vary depending on your family history and other risks.  Hepatitis C blood test.  Hepatitis B blood test.  Sexually transmitted disease (STD) testing.  Diabetes screening. This is done by checking your blood sugar (glucose) after you have not eaten for a while (fasting). You may have this done every 1-3 years.  Abdominal aortic aneurysm (AAA) screening. You may need this if you are a current or former smoker.  Osteoporosis. You may be screened starting at age 39 if you are at high risk. Talk with your health care provider about your test  results, treatment options, and if necessary, the need for more tests. Vaccines  Your health care provider may recommend certain vaccines, such as:  Influenza vaccine. This is recommended every year.  Tetanus, diphtheria, and acellular pertussis (Tdap, Td) vaccine. You may need a Td booster every 10 years.  Zoster vaccine. You may need this after age 54.  Pneumococcal 13-valent conjugate (PCV13) vaccine. One dose is recommended after age 56.  Pneumococcal  polysaccharide (PPSV23) vaccine. One dose is recommended after age 16. Talk to your health care provider about which screenings and vaccines you need and how often you need them. This information is not intended to replace advice given to you by your health care provider. Make sure you discuss any questions you have with your health care provider. Document Released: 11/26/2015 Document Revised: 07/19/2016 Document Reviewed: 08/31/2015 Elsevier Interactive Patient Education  2017 [place] Prevention in the Home Falls can cause injuries. They can happen to people of all ages. There are many things you can do to make your home safe and to help prevent falls. What can I do on the outside of my home?  Regularly fix the edges of walkways and driveways and fix any cracks.  Remove anything that might make you trip as you walk through a door, such as a raised step or threshold.  Trim any bushes or trees on the path to your home.  Use bright outdoor lighting.  Clear any walking paths of anything that might make someone trip, such as rocks or tools.  Regularly check to see if handrails are loose or broken. Make sure that both sides of any steps have handrails.  Any raised decks and porches should have guardrails on the edges.  Have any leaves, snow, or ice cleared regularly.  Use sand or salt on walking paths during winter.  Clean up any spills in your garage right away. This includes oil or grease spills. What can I do in the bathroom?  Use night lights.  Install grab bars by the toilet and in the tub and shower. Do not use towel bars as grab bars.  Use non-skid mats or decals in the tub or shower.  If you need to sit down in the shower, use a plastic, non-slip stool.  Keep the floor dry. Clean up any water that spills on the floor as soon as it happens.  Remove soap buildup in the tub or shower regularly.  Attach bath mats securely with double-sided non-slip rug  tape.  Do not have throw rugs and other things on the floor that can make you trip. What can I do in the bedroom?  Use night lights.  Make sure that you have a light by your bed that is easy to reach.  Do not use any sheets or blankets that are too big for your bed. They should not hang down onto the floor.  Have a firm chair that has side arms. You can use this for support while you get dressed.  Do not have throw rugs and other things on the floor that can make you trip. What can I do in the kitchen?  Clean up any spills right away.  Avoid walking on wet floors.  Keep items that you use a lot in easy-to-reach places.  If you need to reach something above you, use a strong step stool that has a grab bar.  Keep electrical cords out of the way.  Do not use floor polish or wax that makes  floors slippery. If you must use wax, use non-skid floor wax.  Do not have throw rugs and other things on the floor that can make you trip. What can I do with my stairs?  Do not leave any items on the stairs.  Make sure that there are handrails on both sides of the stairs and use them. Fix handrails that are broken or loose. Make sure that handrails are as long as the stairways.  Check any carpeting to make sure that it is firmly attached to the stairs. Fix any carpet that is loose or worn.  Avoid having throw rugs at the top or bottom of the stairs. If you do have throw rugs, attach them to the floor with carpet tape.  Make sure that you have a light switch at the top of the stairs and the bottom of the stairs. If you do not have them, ask someone to add them for you. What else can I do to help prevent falls?  Wear shoes that:  Do not have high heels.  Have rubber bottoms.  Are comfortable and fit you well.  Are closed at the toe. Do not wear sandals.  If you use a stepladder:  Make sure that it is fully opened. Do not climb a closed stepladder.  Make sure that both sides of the  stepladder are locked into place.  Ask someone to hold it for you, if possible.  Clearly mark and make sure that you can see:  Any grab bars or handrails.  First and last steps.  Where the edge of each step is.  Use tools that help you move around (mobility aids) if they are needed. These include:  Canes.  Walkers.  Scooters.  Crutches.  Turn on the lights when you go into a dark area. Replace any light bulbs as soon as they burn out.  Set up your furniture so you have a clear path. Avoid moving your furniture around.  If any of your floors are uneven, fix them.  If there are any pets around you, be aware of where they are.  Review your medicines with your doctor. Some medicines can make you feel dizzy. This can increase your chance of falling. Ask your doctor what other things that you can do to help prevent falls. This information is not intended to replace advice given to you by your health care provider. Make sure you discuss any questions you have with your health care provider. Document Released: 08/26/2009 Document Revised: 04/06/2016 Document Reviewed: 12/04/2014 Elsevier Interactive Patient Education  2017 [employer].

## 2021-01-06 NOTE — Progress Notes (Addendum)
Subjective:   Jamie Zhang is a 80 y.o. male who presents for Medicare Annual/Subsequent preventive examination.  Review of Systems    No ROS.  Medicare Wellness Virtual Visit.    Cardiac Risk Factors include: advanced age (>50men, >72 women);male gender     Objective:    Today's Vitals   01/06/21 1237  Weight: 205 lb (93 kg)  Height: 5\' 9"  (1.753 m)   Body mass index is 30.27 kg/m.  Advanced Directives 01/06/2021 11/03/2020 11/03/2020 10/29/2020 11/10/2019 11/28/2018 11/07/2018  Does Patient Have a Medical Advance Directive? Yes Yes - Yes Yes No Yes  Type of Paramedic of [place];Living will Healthcare Power of [place] of [place];Living will [place];Living will - [place];Living will  Does patient want to make changes to medical advance directive? No - Patient declined No - Patient declined - No - Patient declined No - Patient declined - No - Patient declined  Copy of [place] in Chart? Yes - validated most recent copy scanned in chart (See row information) No - copy requested - No - copy requested Yes - validated most recent copy scanned in chart (See row information) - Yes - validated most recent copy scanned in chart (See row information)  Would patient like information on creating a medical advance directive? - - - - - No - Patient declined -    Current Medications (verified) Outpatient Encounter Medications as of 01/06/2021  Medication Sig   aspirin EC 81 MG tablet Take 81 mg by mouth daily.   atorvastatin (LIPITOR) 20 MG tablet TAKE 1 TABLET(20 MG) BY MOUTH DAILY   Calcium Citrate-Vitamin D (CITRACAL + D PO) Take 1 tablet by mouth daily.   Cholecalciferol (VITAMIN D3) 10000 UNITS TABS Take 10,000 Units by mouth daily.   diclofenac (VOLTAREN) 75 MG EC tablet Take 1 tablet (75 mg total) by mouth 2 (two) times daily. (Patient taking differently:  Take 75 mg by mouth 2 (two) times daily as needed for moderate pain.)   docusate sodium (COLACE) 100 MG capsule Take 100 mg by mouth every other day.   famotidine (PEPCID) 40 MG tablet Take 1 tablet (40 mg total) by mouth daily.   losartan (COZAAR) 50 MG tablet Take 1 tablet (50 mg total) by mouth at bedtime.   meclizine (ANTIVERT) 12.5 MG tablet TAKE 1 TABLET THREE TIMES DAILY AS NEEDED FOR DIZZINESS (Patient taking differently: Take 12.5 mg by mouth 3 (three) times daily as needed for dizziness.)   Facility-Administered Encounter Medications as of 01/06/2021  Medication   dexamethasone (DECADRON) injection 4 mg   triamcinolone acetonide (KENALOG) 10 MG/ML injection 10 mg    Allergies (verified) Patient has no known allergies.   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 Ula Lingo   Sleep apnea  Past Surgical History:  Procedure Laterality Date   BREAST BIOPSY Left    benign   Cataract Surgery Bilateral    COLONOSCOPY WITH PROPOFOL N/A 04/18/2017   Procedure: COLONOSCOPY WITH PROPOFOL;  Surgeon: Robert Bellow, MD;  Location: [facility] ENDOSCOPY;  Service: Endoscopy;  Laterality: N/A;   ENDOVASCULAR REPAIR/STENT GRAFT N/A 11/03/2020   Procedure: ENDOVASCULAR REPAIR/STENT GRAFT;  Surgeon: Algernon Huxley, MD;  Location: [facility];  Service: Cardiovascular;  Laterality: N/A;   INGUINAL HERNIA REPAIR Left    ROTATOR CUFF REPAIR Left    Family History  Problem Relation Age  of Onset   Alcohol abuse Mother    Arthritis Mother    Hypertension Mother    Cancer Father 34       Throat Cancer - died   Alcohol abuse Father    Cancer Sister 78       Breast Cancer   Alcohol abuse Sister    Social History   Socioeconomic History   Marital status: Married    Spouse name: Not on file   Number of children: 2   Years of education: 12   Highest education level: Not on file  Occupational History   Occupation: Freight forwarder    Comment: Worked for [employer]   Occupation: Personal assistant   Occupation: Freight forwarder for [employer]  Tobacco Use   Smoking status: Former Smoker    Packs/day: 2.00    Years: 21.00    Pack years: 42.00    Types: Cigarettes    Quit date: 1980    Years since quitting: 42.1   Smokeless tobacco: Never Used  Scientific laboratory technician Use: Never used  Substance and Sexual Activity   Alcohol use: Yes    Comment: Occasional wine glass   Drug use: No   Sexual activity: Never  Other Topics Concern   Not on file  Social History Narrative   Mr. Sabedra grew up in [place], [place]. He joined the [employer] right out of [employer] and served for 2.5 years then was active in [employer]. 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 He then worked in [employer] and afterward became a Freight forwarder for [employer]. He recently moved to [place] with his wife. They have two daughters that are still living in [place]. As a hobby he enjoys woodwork mainly with cabinet work.    Social Determinants of Health   Financial Resource Strain: Low Risk    Difficulty of Paying Living Expenses: Not hard at all  Food Insecurity: No Food Insecurity   Worried About Charity fundraiser in the Last Year: Never true   [place] in the Last Year: Never true  Transportation Needs: No Transportation Needs   Lack of Transportation (Medical): No   Lack of Transportation (Non-Medical): No  Physical Activity: Sufficiently  Active   Days of Exercise per Week: 6 days   Minutes of Exercise per Session: 150+ min  Stress: No Stress Concern Present   Feeling of Stress : Not at all  Social Connections: Unknown   Frequency of Communication with Friends and Family: Not on file   Frequency of Social Gatherings with Friends and Family: More than three times a week   Attends Religious Services: Not on file   Active Member of Clubs or Organizations: Not on file   Attends Archivist Meetings: Not on file   Marital  Status: Married    Tobacco Counseling Counseling given: Not Answered   Clinical Intake:  Pre-visit preparation completed: Yes        Diabetes: No  How often do you need to have someone help you when you read instructions, pamphlets, or other written materials from your doctor or pharmacy?: 1 - Never  Interpreter Needed?: No      Activities of Daily Living In your present state of health, do you have any difficulty performing the following activities: 01/06/2021 11/03/2020  Hearing? Y -  Comment Followed by pcp and audiology -  Vision? N -  Comment - -  Difficulty concentrating or making decisions? N -  Walking or climbing stairs? N -  Dressing or bathing? N -  Doing errands, shopping? N N  Preparing Food and eating ? N -  Using the Toilet? N -  In the past six months, have you accidently leaked urine? N -  Do you have problems with loss of bowel control? N -  Managing your Medications? N -  Managing your Finances? N -  Housekeeping or managing your Housekeeping? N -  Some recent data might be hidden    Patient Care Team: Crecencio Mc, MD as PCP - General (Internal Medicine) Crecencio Mc, MD (Internal Medicine) Bary Castilla Forest Gleason, MD (General Surgery)  Indicate any recent Medical Services you may have received from other than [facility] providers in the past year (date may be approximate).     Assessment:   This is a routine wellness examination for [employer].  I  connected with [name] today by telephone and verified that I am speaking with the correct person using two identifiers. Location patient: home Location provider: work Persons participating in the virtual visit: patient, Marine scientist.    I discussed the limitations, risks, security and privacy concerns of performing an evaluation and management service by telephone and the availability of in person appointments. The patient expressed understanding and verbally consented to this telephonic visit.    Interactive audio and video telecommunications were attempted between this provider and patient, however failed, due to patient having technical difficulties OR patient did not have access to video capability.  We continued and completed visit with audio only.  Some vital signs may be absent or patient reported.   Hearing/Vision screen  Hearing Screening   125Hz  250Hz  500Hz  1000Hz  2000Hz  3000Hz  4000Hz  6000Hz  8000Hz   Right ear:           Left ear:           Comments: Followed by [facility].  Not yet wearing hearing aids.   Vision Screening Comments: Wears corrective lenses Visual acuity not assessed, virtual visit.  They have seen their ophthalmologist in the last 12 months.     Dietary issues and exercise activities discussed: Current Exercise Habits: Home exercise routine, Type of exercise: calisthenics, Time (Minutes): 30, Frequency (Times/Week): 7, Weekly Exercise (Minutes/Week): 210, Intensity: Mild  Goals       Patient Stated     DIET - REDUCE SUGAR INTAKE (pt-stated)       Depression Screen PHQ 2/9 Scores 01/06/2021 01/03/2021 11/10/2019 11/07/2018 11/02/2017 11/02/2016 11/03/2015  PHQ - 2 Score 0 0 0 0 0 0 0    Fall Risk Fall Risk  01/06/2021 01/03/2021 07/26/2020 11/10/2019 11/10/2019  Falls in the past year? 0 0 0 0 0  Comment - - - - -  Number falls in past yr: 0 - - - -  Injury with Fall?  0 - - - -  Follow up Falls evaluation completed Falls evaluation completed Falls  evaluation completed Falls evaluation completed Education provided    [place]: Handrails in use when climbing stairs? Yes Home free of loose throw rugs in walkways, pet beds, electrical cords, etc? Yes  Adequate lighting in your home to reduce risk of falls? Yes   ASSISTIVE DEVICES UTILIZED TO PREVENT FALLS: Use of a cane, walker or w/c? No   TIMED UP AND GO: Was the test performed? No . Virtual visit.  Cognitive Function: MMSE - Mini Mental State Exam 11/02/2016  Orientation to time 5  Orientation to Place 5  Registration 3  Attention/ Calculation 5  Recall 3  Language- name 2 objects 2  Language- repeat 1  Language- follow 3 step command 3  Language- read & follow direction 1  Write a sentence 1  Copy design 1  Total score 30     6CIT Screen 01/06/2021 11/10/2019 11/07/2018 11/02/2017  What Year? 0 points 0 points 0 points 0 points  What month? 0 points 0 points 0 points 0 points  What time? 0 points 0 points 0 points 0 points  Count back from 20 0 points - 0 points 0 points  Months in reverse 0 points - 0 points 0 points  Repeat phrase 0 points - 0 points 0 points  Total Score 0 - 0 0    Immunizations Immunization History  Administered Date(s) Administered   Fluad Quad(high Dose 65+) 07/28/2019   Influenza Split 09/01/2015   Influenza, High Dose Seasonal PF 08/14/2016, 08/19/2018   Influenza-Unspecified 08/25/2014, 08/01/2017, 08/10/2020   PFIZER(Purple Top)SARS-COV-2 Vaccination 11/28/2019, 12/17/2019, 08/19/2020   Pneumococcal Conjugate-13 05/04/2015   Pneumococcal Polysaccharide-23 09/19/2005   Tdap 08/08/2010   Zoster Recombinat (Shingrix) 07/10/2018, 09/18/2018   Health Maintenance  Health Maintenance  Topic Date Due   COLONOSCOPY (Pts 45-42yrs Insurance coverage will need to be confirmed)  04/18/2020   TETANUS/TDAP  08/08/2020   INFLUENZA VACCINE  Completed   COVID-19 Vaccine  Completed   PNA vac Low Risk Adult   Completed   Tdap- deferred.   Colorectal cancer screening: Type of screening: Colonoscopy. Completed 04/18/17. Repeat every 3 years  Lung Cancer Screening: (Low Dose CT Chest recommended if Age 39-80 years, 30 pack-year currently smoking OR have quit w/in 15years.) does not qualify.   Hepatitis C Screening: does not qualify.  Vision Screening: Recommended annual ophthalmology exams for early detection of glaucoma and other disorders of the eye. Is the patient up to date with their annual eye exam?  Yes  Who is the provider or what is the name of the office in which the patient attends annual eye exams? [facility]  Dental Screening: Recommended annual dental exams for proper oral hygiene.  Community Resource Referral / Chronic Care Management: CRR required this visit?  No   CCM required this visit?  No      Plan:    Keep all routine maintenance appointments.   Follow up 02/01/21   I have personally reviewed and noted the following in the patients chart:   Medical and social history Use of alcohol, tobacco or illicit drugs  Current medications and supplements Functional ability and status Nutritional status Physical activity Advanced directives List of other physicians Hospitalizations, surgeries, and ER visits in previous 12 months Vitals Screenings to include cognitive, depression, and falls Referrals and appointments  In addition, I have reviewed and discussed with patient certain  preventive protocols, quality metrics, and best practice recommendations. A written personalized care plan for preventive services as well as general preventive health recommendations were provided to patient via mychart.     OBrien-Blaney, Caylyn Tedeschi L, LPN   5/84/4171    I have reviewed the above information and agree with above.   Deborra Medina, MD

## 2021-01-17 ENCOUNTER — Ambulatory Visit (INDEPENDENT_AMBULATORY_CARE_PROVIDER_SITE_OTHER): Payer: Medicare Other | Admitting: Podiatry

## 2021-01-17 ENCOUNTER — Encounter: Payer: Self-pay | Admitting: Podiatry

## 2021-01-17 ENCOUNTER — Other Ambulatory Visit: Payer: Self-pay

## 2021-01-17 DIAGNOSIS — M722 Plantar fascial fibromatosis: Secondary | ICD-10-CM | POA: Diagnosis not present

## 2021-01-17 NOTE — Progress Notes (Signed)
  Subjective:  Patient ID: Jamie Zhang, male    DOB: [birth year],  MRN: [id number]  Chief Complaint  Patient presents with  . Plantar Fasciitis    "they are doing a lot better    80 y.o. male returns with the above complaint. History confirmed with patient.  Injection did not help much but he has been doing the stretching exercises very consistently and is doing much better now  Objective:  Physical Exam: warm, good capillary refill, no trophic changes or ulcerative lesions, normal DP and PT pulses and normal sensory exam.  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   Plan:  Patient was evaluated and treated and all questions answered.  He is doing much better and is on his road to complete recovery.  I recommended he continue to do his stretching sizes until he is able he is pain-free for 2 weeks.  We discussed different types of shoes in which would be beneficial for him.  I recommended [employer] or [employer] and he will consider these for the future if he gets new shoes.  Return as needed if heels become symptomatic again  Return if symptoms worsen or fail to improve.

## 2021-01-27 ENCOUNTER — Other Ambulatory Visit: Payer: Self-pay

## 2021-01-27 MED ORDER — LOSARTAN POTASSIUM 100 MG PO TABS: 100.0000 mg | ORAL_TABLET | Freq: Every day | ORAL | 1 refills | Status: DC

## 2021-02-01 ENCOUNTER — Other Ambulatory Visit: Payer: Self-pay

## 2021-02-01 ENCOUNTER — Ambulatory Visit (INDEPENDENT_AMBULATORY_CARE_PROVIDER_SITE_OTHER): Payer: Medicare Other | Admitting: Internal Medicine

## 2021-02-01 ENCOUNTER — Encounter: Payer: Self-pay | Admitting: Internal Medicine

## 2021-02-01 VITALS — BP 100/56 | HR 61 | Temp 98.0°F | Resp 15 | Ht 69.0 in | Wt 206.4 lb

## 2021-02-01 DIAGNOSIS — I10 Essential (primary) hypertension: Secondary | ICD-10-CM | POA: Diagnosis not present

## 2021-02-01 DIAGNOSIS — G4733 Obstructive sleep apnea (adult) (pediatric): Secondary | ICD-10-CM | POA: Diagnosis not present

## 2021-02-01 DIAGNOSIS — E785 Hyperlipidemia, unspecified: Secondary | ICD-10-CM

## 2021-02-01 DIAGNOSIS — R413 Other amnesia: Secondary | ICD-10-CM | POA: Diagnosis not present

## 2021-02-01 LAB — COMPREHENSIVE METABOLIC PANEL
ALT: 17 U/L (ref 0–53)
AST: 15 U/L (ref 0–37)
Albumin: 4.3 g/dL (ref 3.5–5.2)
Alkaline Phosphatase: 51 U/L (ref 39–117)
BUN: 24 mg/dL — ABNORMAL HIGH (ref 6–23)
CO2: 29 mEq/L (ref 19–32)
Calcium: 9.7 mg/dL (ref 8.4–10.5)
Chloride: 106 mEq/L (ref 96–112)
Creatinine, Ser: 1.16 mg/dL (ref 0.40–1.50)
GFR: 59.36 mL/min — ABNORMAL LOW (ref >60.00)
Glucose, Bld: 120 mg/dL — ABNORMAL HIGH (ref 70–99)
Potassium: 4 mEq/L (ref 3.5–5.1)
Sodium: 141 mEq/L (ref 135–145)
Total Bilirubin: 1.6 mg/dL — ABNORMAL HIGH (ref 0.2–1.2)
Total Protein: 6.2 g/dL (ref 6.0–8.3)

## 2021-02-01 NOTE — Assessment & Plan Note (Signed)
Managed with CPAP with persistent respiratory variation with 10 to 20 events per hour.  He has an appointment with Pulmonology in the near future

## 2021-02-01 NOTE — Assessment & Plan Note (Signed)
Bp has responded well post procedure and now bordering on too low.  Advised to reduce losartan 50 mg daily and follow BP readings daily .  Goal 120/70 to 130/80

## 2021-02-01 NOTE — Progress Notes (Signed)
Subjective:  Patient ID: Jamie Zhang, male    DOB: Sep 26, 1941  Age: 80 y.o. MRN: [id number]  CC: The primary encounter diagnosis was Hyperlipidemia LDL goal <100. Diagnoses of OSA (obstructive sleep apnea), Primary hypertension, and Episode of memory loss were also pertinent to this visit.  HPI Jamie Zhang presents for follow up on cognitive issues raised  at last meeting  This visit occurred during the SARS-CoV-2 public health emergency.  Safety protocols were in place, including screening questions prior to the visit, additional usage of staff PPE, and extensive cleaning of exam room while observing appropriate contact time as indicated for disinfecting solutions.   Patient was seen 2 weeks ago and asked to return for MMSE due to provider noticing him having difficulty remembering names of medications,  Names etc in the setting of ongoing family dysfunction .  Screening labs were normal   He had been not sleeping well due to his CPAP mask malfunction , and his event tn repot in spite of fixing the CPAP is quite variable despite any evidence of leaks   10 to 20 apneic events per hour, occasional down to 4 . .  He has an appt with pulmonologist in May .  Does not drink any alcohol,, and no sedatives at night . Uses meditation music to relas by.     mini-mental status exam done today.  He scored 30/30.  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  Outpatient Medications Prior to Visit  Medication Sig Dispense Refill  . aspirin EC 81 MG tablet Take 81 mg by mouth daily.    [name] atorvastatin (LIPITOR) 20 MG tablet TAKE 1 TABLET(20 MG) BY MOUTH DAILY 90 tablet 1  . Calcium Citrate-Vitamin D (CITRACAL + D PO) Take 1 tablet by mouth daily.    . Cholecalciferol (VITAMIN D3) 10000 UNITS TABS Take 10,000 Units by mouth daily.    . diclofenac  (VOLTAREN) 75 MG EC tablet Take 1 tablet (75 mg total) by mouth 2 (two) times daily. (Patient taking differently: Take 75 mg by mouth 2 (two) times daily as needed for moderate pain.) 60 tablet 2  . docusate sodium (COLACE) 100 MG capsule Take 100 mg by mouth every other day.    . famotidine (PEPCID) 40 MG tablet Take 1 tablet (40 mg total) by mouth daily. 90 tablet 3  . fluticasone (FLONASE) 50 MCG/ACT nasal spray Place 2 sprays into both nostrils daily.    [name] losartan (COZAAR) 100 MG tablet Take 1 tablet (100 mg total) by mouth at bedtime. 90 tablet 1  . meclizine (ANTIVERT) 12.5 MG tablet TAKE 1 TABLET THREE TIMES DAILY AS NEEDED FOR DIZZINESS (Patient taking differently: Take 12.5 mg by mouth 3 (three) times daily as needed for dizziness.) 90 tablet 0   Facility-Administered Medications Prior to Visit  Medication Dose Route Frequency Provider Last Rate Last Admin  . dexamethasone (DECADRON) injection 4 mg  4 mg Other Once Criselda Peaches, DPM      . triamcinolone acetonide (KENALOG) 10 MG/ML injection 10 mg  10 mg Other Once Criselda Peaches, DPM        Review of Systems;  Patient denies headache, fevers, malaise, unintentional weight loss, skin rash, eye pain, sinus congestion and sinus pain, sore throat, dysphagia,  hemoptysis , cough, dyspnea, wheezing, chest pain, palpitations, orthopnea, edema, abdominal pain, nausea, melena, diarrhea, constipation, flank pain, dysuria, hematuria, urinary  Frequency, nocturia, numbness, tingling, seizures,  Focal weakness, Loss of consciousness,  Tremor, insomnia, depression, anxiety, and suicidal ideation.      Objective:  BP (!) 100/56 (BP Location: Left Arm, Patient Position: Sitting, Cuff Size: Large)   Pulse 61   Temp 98 F (36.7 C) (Oral)   Resp 15   Ht 5\' 9"  (1.753 m)   Wt 206 lb 6.4 oz (93.6 kg)   SpO2 98%   BMI 30.48 kg/m   BP Readings from Last 3 Encounters:  02/01/21 (!) 100/56  01/03/21 130/74  11/25/20 (!) 196/92    Wt  Readings from Last 3 Encounters:  02/01/21 206 lb 6.4 oz (93.6 kg)  01/06/21 205 lb (93 kg)  01/03/21 205 lb 3.2 oz (93.1 kg)    General appearance: alert, cooperative and appears stated age Ears: normal TM's and external ear canals both ears Throat: lips, mucosa, and tongue normal; teeth and gums normal Neck: no adenopathy, no carotid bruit, supple, symmetrical, trachea midline and thyroid not enlarged, symmetric, no tenderness/mass/nodules Back: symmetric, no curvature. ROM normal. No CVA tenderness. Lungs: clear to auscultation bilaterally Heart: regular rate and rhythm, S1, S2 normal, no murmur, click, rub or gallop Abdomen: soft, non-tender; bowel sounds normal; no masses,  no organomegaly Pulses: 2+ and symmetric Skin: Skin color, texture, turgor normal. No rashes or lesions Lymph nodes: Cervical, supraclavicular, and axillary nodes normal.  Lab Results  Component Value Date   HGBA1C 6.0 07/26/2020   HGBA1C 5.7 08/08/2019   HGBA1C 5.8 01/24/2019    Lab Results  Component Value Date   CREATININE 1.16 02/01/2021   CREATININE 1.09 01/03/2021   CREATININE 1.18 12/07/2020    Lab Results  Component Value Date   WBC 14.1 (H) 11/04/2020   HGB 14.4 11/04/2020   HCT 42.1 11/04/2020   PLT 164 11/04/2020   GLUCOSE 120 (H) 02/01/2021   CHOL 111 07/26/2020   TRIG 89.0 07/26/2020   HDL 49.70 07/26/2020   LDLDIRECT 47.0 12/24/2015   LDLCALC 44 07/26/2020   ALT 17 02/01/2021   AST 15 02/01/2021   NA 141 02/01/2021   K 4.0 02/01/2021   CL 106 02/01/2021   CREATININE 1.16 02/01/2021   BUN 24 (H) 02/01/2021   CO2 29 02/01/2021   TSH 1.81 01/03/2021   PSA 1.19 08/08/2019   INR 1.1 11/01/2020   HGBA1C 6.0 07/26/2020    No results found.  Assessment & Plan:   Problem List Items Addressed This Visit      Unprioritized   OSA (obstructive sleep apnea)    Managed with CPAP with persistent respiratory variation with 10 to 20 events per hour.  He has an appointment with  Pulmonology in the near future       Hypertension    Bp has responded well post procedure and now bordering on too low.  Advised to reduce losartan 50 mg daily and follow BP readings daily .  Goal 120/70 to 130/80      Episode of memory loss    Secondary to sleep deprivation.  MMSE is excellent and he has multiple hobbies that require abstract thinking and calculations.   no further workup at this time/         Other Visit Diagnoses    Hyperlipidemia LDL goal <100    -  Primary   Relevant Orders   Comprehensive metabolic  panel (Completed)      I am having Jamie Zhang maintain his Calcium Citrate-Vitamin D (CITRACAL + D PO), Vitamin D3, aspirin EC, meclizine, famotidine, diclofenac, docusate sodium, atorvastatin, losartan, and fluticasone. We will continue to administer dexamethasone and triamcinolone acetonide.  No orders of the defined types were placed in this encounter.   There are no discontinued medications.  Follow-up: Return in about 6 months (around 08/04/2021).   Crecencio Mc, MD

## 2021-02-01 NOTE — Patient Instructions (Signed)
Your blood pressure has really responded to the higher dose of losartan,  Almost  Too much   Our goal is 120/79 to 130/80    For the next week,  Reduce the losartan to 1/2 tablet daily and follow your daily readings,  DO NOT CUT IN ADVANCE.  Take  The other half the following day   You have no memory issues currently!

## 2021-02-01 NOTE — Assessment & Plan Note (Signed)
Secondary to sleep deprivation.  MMSE is excellent and he has multiple hobbies that require abstract thinking and calculations.   no further workup at this time/

## 2021-02-17 ENCOUNTER — Other Ambulatory Visit: Payer: Self-pay | Admitting: Internal Medicine

## 2021-02-17 DIAGNOSIS — I10 Essential (primary) hypertension: Secondary | ICD-10-CM

## 2021-02-17 MED ORDER — LOSARTAN POTASSIUM 50 MG PO TABS: 50.0000 mg | ORAL_TABLET | Freq: Every day | ORAL | 0 refills | Status: DC

## 2021-02-17 NOTE — Assessment & Plan Note (Signed)
HOME BPS ARE AT GOAL ON 59 MG LOSARTAN

## 2021-02-21 DIAGNOSIS — Z20822 Contact with and (suspected) exposure to covid-19: Secondary | ICD-10-CM | POA: Diagnosis not present

## 2021-02-24 DIAGNOSIS — D2262 Melanocytic nevi of left upper limb, including shoulder: Secondary | ICD-10-CM | POA: Diagnosis not present

## 2021-02-24 DIAGNOSIS — D2271 Melanocytic nevi of right lower limb, including hip: Secondary | ICD-10-CM | POA: Diagnosis not present

## 2021-02-24 DIAGNOSIS — X32XXXA Exposure to sunlight, initial encounter: Secondary | ICD-10-CM | POA: Diagnosis not present

## 2021-02-24 DIAGNOSIS — D225 Melanocytic nevi of trunk: Secondary | ICD-10-CM | POA: Diagnosis not present

## 2021-02-24 DIAGNOSIS — Z85828 Personal history of other malignant neoplasm of skin: Secondary | ICD-10-CM | POA: Diagnosis not present

## 2021-02-24 DIAGNOSIS — D2261 Melanocytic nevi of right upper limb, including shoulder: Secondary | ICD-10-CM | POA: Diagnosis not present

## 2021-02-24 DIAGNOSIS — L570 Actinic keratosis: Secondary | ICD-10-CM | POA: Diagnosis not present

## 2021-02-28 ENCOUNTER — Other Ambulatory Visit (INDEPENDENT_AMBULATORY_CARE_PROVIDER_SITE_OTHER): Payer: Self-pay | Admitting: Vascular Surgery

## 2021-02-28 ENCOUNTER — Other Ambulatory Visit (INDEPENDENT_AMBULATORY_CARE_PROVIDER_SITE_OTHER): Payer: Self-pay | Admitting: Nurse Practitioner

## 2021-02-28 DIAGNOSIS — Z09 Encounter for follow-up examination after completed treatment for conditions other than malignant neoplasm: Secondary | ICD-10-CM

## 2021-03-01 ENCOUNTER — Encounter (INDEPENDENT_AMBULATORY_CARE_PROVIDER_SITE_OTHER): Payer: Self-pay | Admitting: Vascular Surgery

## 2021-03-01 ENCOUNTER — Ambulatory Visit (INDEPENDENT_AMBULATORY_CARE_PROVIDER_SITE_OTHER): Payer: Medicare Other | Admitting: Vascular Surgery

## 2021-03-01 ENCOUNTER — Other Ambulatory Visit: Payer: Self-pay

## 2021-03-01 ENCOUNTER — Ambulatory Visit (INDEPENDENT_AMBULATORY_CARE_PROVIDER_SITE_OTHER): Payer: Medicare Other

## 2021-03-01 VITALS — BP 128/76 | HR 54 | Ht 69.0 in | Wt 209.0 lb

## 2021-03-01 DIAGNOSIS — E781 Pure hyperglyceridemia: Secondary | ICD-10-CM

## 2021-03-01 DIAGNOSIS — I10 Essential (primary) hypertension: Secondary | ICD-10-CM | POA: Diagnosis not present

## 2021-03-01 DIAGNOSIS — I714 Abdominal aortic aneurysm, without rupture, unspecified: Secondary | ICD-10-CM

## 2021-03-01 DIAGNOSIS — Z09 Encounter for follow-up examination after completed treatment for conditions other than malignant neoplasm: Secondary | ICD-10-CM

## 2021-03-01 NOTE — Progress Notes (Signed)
MRN : [id number]  Jamie Zhang is a 80 y.o. (1941/07/01) male who presents with chief complaint of  Chief Complaint  Patient presents with  . Follow-up    3 mo evar.   [name]  History of Present Illness: Patient returns today in follow up of his AAA.  He is about 4 months status post endovascular repair of his aneurysm.  He is doing quite well.  He has no aneurysm related symptoms.  He is on antihypertensive medication now but his blood pressure is well controlled.  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  Today is 128/76 and he says it has been well controlled.  Duplex today shows a patent stent graft with no evidence of endoleak and a decrease in the aortic sac size down to 4.3 cm in maximal diameter.  Current Outpatient Medications  Medication Sig Dispense Refill  . aspirin EC 81 MG tablet Take 81 mg by mouth daily.    [name] atorvastatin (LIPITOR) 20 MG tablet TAKE 1 TABLET(20 MG) BY MOUTH DAILY 90 tablet 1  . Calcium Citrate-Vitamin D (CITRACAL + D PO) Take 1 tablet by mouth daily.    . Cholecalciferol (VITAMIN D3) 10000 UNITS TABS Take 10,000 Units by mouth daily.    . diclofenac (VOLTAREN) 75 MG EC tablet Take 1 tablet (75 mg total) by mouth 2 (two) times daily. (Patient taking differently: Take 75 mg by mouth 2 (two) times daily as needed for moderate pain.) 60 tablet 2  . docusate sodium (COLACE) 100 MG capsule Take 100 mg by mouth every other day.    . famotidine (PEPCID) 40 MG tablet Take 1 tablet (40 mg total) by mouth daily. 90 tablet 3  . fluticasone (FLONASE) 50 MCG/ACT nasal spray Place 2 sprays into both nostrils daily.    [name] losartan (COZAAR) 50 MG tablet Take 1 tablet (50 mg total) by mouth at bedtime. 90 tablet 0  . meclizine (ANTIVERT) 12.5 MG tablet TAKE 1 TABLET THREE TIMES DAILY AS NEEDED FOR DIZZINESS (Patient taking differently: Take 12.5 mg by mouth 3 (three) times daily as needed for dizziness.) 90 tablet 0    Current Facility-Administered Medications  Medication Dose Route Frequency Provider Last Rate Last Admin  . dexamethasone (DECADRON) injection 4 mg  4 mg Other Once Criselda Peaches, DPM      . triamcinolone acetonide (KENALOG) 10 MG/ML injection 10 mg  10 mg Other Once Criselda Peaches, DPM        Past Medical History:  Diagnosis Date  . Arthritis   . Cataract 01/02/2011   Bilateral - s/p excision  . Colon polyps 2000   Hyperplastic - Repeat colonoscopy 2015  . GERD (gastroesophageal reflux disease)    Pantoprazole  . Hemorrhoids 2006  . HLD (hyperlipidemia)   . Iron deficiency anemia 03/20/2006   Donating too much blood  . Migraine    No longer has problems  . Osteopenia    on Fosamax  . RLS (restless legs syndrome) 03/20/2006   2/2 iron deficiency  . Schatzki's ring   . Skin cancer 01/04/2012   Basal cell nose, squamous cell scalp - Dr. Ula Lingo  . Sleep apnea     Past Surgical History:  Procedure Laterality Date  . BREAST BIOPSY Left    benign  . Cataract Surgery Bilateral   . COLONOSCOPY WITH PROPOFOL N/A 04/18/2017   Procedure: COLONOSCOPY WITH PROPOFOL;  Surgeon: Hervey Ard  W, MD;  Location: [facility];  Service: Endoscopy;  Laterality: N/A;  . ENDOVASCULAR REPAIR/STENT GRAFT N/A 11/03/2020   Procedure: ENDOVASCULAR REPAIR/STENT GRAFT;  Surgeon: Algernon Huxley, MD;  Location: [facility];  Service: Cardiovascular;  Laterality: N/A;  . INGUINAL HERNIA REPAIR Left   . ROTATOR CUFF REPAIR Left      Social History   Tobacco Use  . Smoking status: Former Smoker    Packs/day: 2.00    Years: 21.00    Pack years: 42.00    Types: Cigarettes    Quit date: 1980    Years since quitting: 42.3  . Smokeless tobacco: Never Used  Vaping Use  . Vaping Use: Never used  Substance Use Topics  . Alcohol use: Yes    Comment: Occasional wine glass  . Drug use: No      Family History  Problem Relation Age of Onset  . Alcohol abuse Mother   . Arthritis  Mother   . Hypertension Mother   . Cancer Father 68       Throat Cancer - died  . Alcohol abuse Father   . Cancer Sister 39       Breast Cancer  . Alcohol abuse Sister      No Known Allergies   REVIEW OF SYSTEMS(Negative unless checked)  Constitutional: '[]' ????Weight loss'[]' ????Fever'[]' ????Chills Cardiac:'[]' ????Chest pain'[]' ????Chest pressure'[]' ????Palpitations '[]' ????Shortness of breath when laying flat '[]' ????Shortness of breath at rest '[]' ????Shortness of breath with exertion. Vascular: '[]' ????Pain in legs with walking'[]' ????Pain in legsat rest'[]' ????Pain in legs when laying flat '[]' ????Claudication '[]' ????Pain in feet when walking '[]' ????Pain in feet at rest '[]' ????Pain in feet when laying flat '[]' ????History of DVT '[]' ????Phlebitis '[]' ????Swelling in legs '[]' ????Varicose veins '[]' ????Non-healing ulcers Pulmonary: '[]' ????Uses home oxygen '[]' ????Productive cough'[]' ????Hemoptysis '[]' ????Wheeze '[]' ????COPD '[]' ????Asthma Neurologic: '[]' ????Dizziness '[]' ????Blackouts '[]' ????Seizures '[]' ????History of stroke '[]' ????History of TIA'[]' ????Aphasia '[]' ????Temporary blindness'[]' ????Dysphagia '[]' ????Weaknessor numbness in arms '[]' ????Weakness or numbnessin legs Musculoskeletal: '[x]' ????Arthritis '[]' ????Joint swelling '[]' ????Joint pain '[x]' ????Low back pain Hematologic:'[]' ????Easy bruising'[]' ????Easy bleeding '[]' ????Hypercoagulable state '[]' ????Anemic  Gastrointestinal:'[]' ????Blood in stool'[]' ????Vomiting blood'[x]' ????Gastroesophageal reflux/heartburn'[]' ????Abdominal pain Genitourinary: '[]' ????Chronic kidney disease '[]' ????Difficulturination '[]' ????Frequenturination '[]' ????Burning with urination'[]' ????Hematuria Skin: '[]' ????Rashes '[]' ????Ulcers '[]' ????Wounds Psychological: '[]' ????History of anxiety'[]' ????History of major depression.  Physical Examination  BP 128/76   Pulse (!) 54   Ht '5\' 9"'  (1.753 m)   Wt 209 lb (94.8 kg)   BMI  30.86 kg/m  Gen:  WD/WN, NAD. Appears younger than stated age. Head: [place]/AT, No temporalis wasting. Ear/Nose/Throat: Hearing grossly intact, nares w/o erythema or drainage Eyes: Conjunctiva clear. Sclera non-icteric Neck: Supple.  Trachea midline Pulmonary:  Good air movement, no use of accessory muscles.  Cardiac: RRR, no JVD Vascular:  Vessel Right Left  Radial Palpable Palpable               Gastrointestinal: soft, non-tender/non-distended. No guarding/reflex.  Musculoskeletal: M/S 5/5 throughout.  No deformity or atrophy. No edema. Neurologic: Sensation grossly intact in extremities.  Symmetrical.  Speech is fluent.  Psychiatric: Judgment intact, Mood & affect appropriate for pt's clinical situation. Dermatologic: No rashes or ulcers noted.  No cellulitis or open wounds.       Labs Recent Results (from the past 2160 hour(s))  Basic metabolic panel     Status: Abnormal   Collection Time: 12/07/20  2:15 PM  Result Value Ref Range   Glucose 93 65 - 99 mg/dL   BUN 19 8 - 27 mg/dL   Creatinine, Ser 1.18 0.76 - 1.27 mg/dL   GFR calc non Af Amer 58 (L) >59 mL/min/1.73   GFR calc Af Amer 67 >59  mL/min/1.73    Comment: **In accordance with recommendations from the NKF-ASN Task force,**   [facility] is in the process of updating its eGFR calculation to the   2021 CKD-EPI creatinine equation that estimates kidney function   without a race variable.    BUN/Creatinine Ratio 16 10 - 24   Sodium 143 134 - 144 mmol/L   Potassium 4.5 3.5 - 5.2 mmol/L   Chloride 105 96 - 106 mmol/L   CO2 26 20 - 29 mmol/L   Calcium 10.0 8.6 - 10.2 mg/dL  TSH     Status: None   Collection Time: 01/03/21  2:15 PM  Result Value Ref Range   TSH 1.81 0.35 - 4.50 uIU/mL  Vitamin B12     Status: None   Collection Time: 01/03/21  2:15 PM  Result Value Ref Range   Vitamin B-12 411 211 - 911 pg/mL  RPR     Status: None   Collection Time: 01/03/21  2:15 PM  Result Value Ref Range   RPR Ser Ql  NON-REACTIVE NON-REACTI  Basic metabolic panel     Status: Abnormal   Collection Time: 01/03/21  2:15 PM  Result Value Ref Range   Sodium 141 135 - 145 mEq/L   Potassium 3.8 3.5 - 5.1 mEq/L   Chloride 105 96 - 112 mEq/L   CO2 26 19 - 32 mEq/L   Glucose, Bld 129 (H) 70 - 99 mg/dL   BUN 22 6 - 23 mg/dL   Creatinine, Ser 1.09 0.40 - 1.50 mg/dL   GFR 64.00 >60.00 mL/min    Comment: Calculated using the CKD-EPI Creatinine Equation (2021)   Calcium 9.6 8.4 - 10.5 mg/dL  Comprehensive metabolic panel     Status: Abnormal   Collection Time: 02/01/21  1:35 PM  Result Value Ref Range   Sodium 141 135 - 145 mEq/L   Potassium 4.0 3.5 - 5.1 mEq/L   Chloride 106 96 - 112 mEq/L   CO2 29 19 - 32 mEq/L   Glucose, Bld 120 (H) 70 - 99 mg/dL   BUN 24 (H) 6 - 23 mg/dL   Creatinine, Ser 1.16 0.40 - 1.50 mg/dL   Total Bilirubin 1.6 (H) 0.2 - 1.2 mg/dL   Alkaline Phosphatase 51 39 - 117 U/L   AST 15 0 - 37 U/L   ALT 17 0 - 53 U/L   Total Protein 6.2 6.0 - 8.3 g/dL   Albumin 4.3 3.5 - 5.2 g/dL   GFR 59.36 (L) >60.00 mL/min    Comment: Calculated using the CKD-EPI Creatinine Equation (2021)   Calcium 9.7 8.4 - 10.5 mg/dL    Radiology No results found.  Assessment/Plan HLD (hyperlipidemia) lipid control important in reducing the progression of atherosclerotic disease. Continue statin therapy   Abdominal aortic aneurysm Duplex today shows a patent stent graft with no evidence of endoleak and a decrease in the aortic sac size down to 4.3 cm in maximal diameter.  Doing well about 4 months status post endovascular repair.  Stretches follow-up about see him in 6 months.  Continue current medical regimen.  Hypertension blood pressure control important in reducing the progression of atherosclerotic disease and aneurysmal disease. On appropriate oral medications.     Leotis Pain, MD  03/01/2021 9:34 AM    This note was created with Dragon medical transcription system.  Any errors from  dictation are purely unintentional

## 2021-03-01 NOTE — Assessment & Plan Note (Signed)
blood pressure control important in reducing the progression of atherosclerotic disease and aneurysmal disease. On appropriate oral medications.  

## 2021-03-15 ENCOUNTER — Ambulatory Visit (INDEPENDENT_AMBULATORY_CARE_PROVIDER_SITE_OTHER): Payer: Medicare Other | Admitting: Internal Medicine

## 2021-03-15 ENCOUNTER — Encounter: Payer: Self-pay | Admitting: Internal Medicine

## 2021-03-15 ENCOUNTER — Other Ambulatory Visit: Payer: Self-pay

## 2021-03-15 VITALS — BP 130/72 | HR 68 | Temp 97.7°F | Ht 69.0 in | Wt 204.2 lb

## 2021-03-15 DIAGNOSIS — Z9989 Dependence on other enabling machines and devices: Secondary | ICD-10-CM

## 2021-03-15 DIAGNOSIS — G4733 Obstructive sleep apnea (adult) (pediatric): Secondary | ICD-10-CM

## 2021-03-15 NOTE — Progress Notes (Signed)
[facility] Consultation      Date: 03/15/2021  MRN# [id number] Jamie Zhang [birth year]     SYNOPSIS Jamie Zhang is a 80 y.o. male  who has been diagnosed with obstructive sleep apnea by a test at sleep med on 09/25/2018.  I have been incrementally increasing his CPAP pressure range to take care of residual apneas. At last visit it was adjusted to 15-25 since he was hitting his max of 20.  The pressure was then increased to 15-20, however patient felt that this was too high, therefore it was decreased to 12-18. He feels that he is less sleepy during the day, he had a strong tendency to doze off before which is now gone.    **CPAP download 04/07/2019-05/06/2019>> raw data personally reviewed.  Uses greater than 4 hours 30/30 days.  Average usage on days used is 7 hours 53 minutes, pressure ranges 12-18.  Median pressure is 14.6,-percentile pressure 17.8, max pressure 17.9.  Residual AHI is 16 which are obstructive. **CPAP download data 10/22/2018-11/14/2018>> uses greater than 4 hours is 24/24 days.  Average usage on days used is 7 hours 47 minutes.  Pressure ranges 10-20, median pressure 15, 95th percentile pressure 19, maximum pressure 19.8.  Residual AHI is 18.6 with an apnea index of 14, central index of 3.  Leaks are within normal limits. **CPAP download 10/17/2018- 10/23/2018>> raw data present reviewed, CPAP setting is 12, average nightly usage is about 8 hours, apnea index is at or above 30 on most of these nights at the pressure of 12.  Upon increase to a pressure range of 6-20 apnea index decreased to approximately 17.  At this range median pressure is 13, 95th percentile pressure is 20 **CPAP titration study 09/19/18>> events eliminated at CPAP of 12, and he was recommend to be on CPAP at 12.  **HST 08/30/18>> AHI of 45.9   CC Follow up OSA  HPI Patient with excellent compliance report Seems to be doing well with the CPAP regimen Previous AHI was over 45 Patient is  feeling great Compliance report reviewed in detail with patient AHI now 12 no major leaks AUTO 15-20 cm h20  I feel at this time it is necessary to do an in lab CPAP BiPAP titration sleep study to assess better control of AHI however patient does not have any significant daytime sleepiness or fatigue Patient states he feels fine However he agrees to in lab CPAP titration study  No evidence of heart failure at this time No evidence or signs of infection at this time No respiratory distress No fevers, chills, nausea, vomiting, diarrhea No evidence of lower extremity edema No evidence hemoptysis      Medication:    Current Outpatient Medications:  .  aspirin EC 81 MG tablet, Take 81 mg by mouth daily., Disp: , Rfl:  .  atorvastatin (LIPITOR) 20 MG tablet, TAKE 1 TABLET(20 MG) BY MOUTH DAILY, Disp: 90 tablet, Rfl: 1 .  Calcium Citrate-Vitamin D (CITRACAL + D PO), Take 1 tablet by mouth daily., Disp: , Rfl:  .  Cholecalciferol (VITAMIN D3) 10000 UNITS TABS, Take 10,000 Units by mouth daily., Disp: , Rfl:  .  diclofenac (VOLTAREN) 75 MG EC tablet, Take 1 tablet (75 mg total) by mouth 2 (two) times daily. (Patient taking differently: Take 75 mg by mouth 2 (two) times daily as needed for moderate pain.), Disp: 60 tablet, Rfl: 2 .  docusate sodium (COLACE) 100 MG capsule, Take 100 mg by mouth  every other day., Disp: , Rfl:  .  famotidine (PEPCID) 40 MG tablet, Take 1 tablet (40 mg total) by mouth daily., Disp: 90 tablet, Rfl: 3 .  fluticasone (FLONASE) 50 MCG/ACT nasal spray, Place 2 sprays into both nostrils daily., Disp: , Rfl:  .  losartan (COZAAR) 50 MG tablet, Take 1 tablet (50 mg total) by mouth at bedtime., Disp: 90 tablet, Rfl: 0 .  meclizine (ANTIVERT) 12.5 MG tablet, TAKE 1 TABLET THREE TIMES DAILY AS NEEDED FOR DIZZINESS (Patient taking differently: Take 12.5 mg by mouth 3 (three) times daily as needed for dizziness.), Disp: 90 tablet, Rfl: 0  Current Facility-Administered  Medications:  .  dexamethasone (DECADRON) injection 4 mg, 4 mg, Other, Once, McDonald, Adam R, DPM .  triamcinolone acetonide (KENALOG) 10 MG/ML injection 10 mg, 10 mg, Other, Once, McDonald, Stephan Minister, DPM   Allergies:  Patient has no known allergies.    Review of Systems:  Gen:  Denies  fever, sweats, chills weight loss  HEENT: Denies blurred vision, double vision, ear pain, eye pain, hearing loss, nose bleeds, sore throat Cardiac:  No dizziness, chest pain or heaviness, chest tightness,edema, No JVD Resp:   No cough, -sputum production, -shortness of breath,-wheezing, -hemoptysis,  Gi: Denies swallowing difficulty, stomach pain, nausea or vomiting, diarrhea, constipation, bowel incontinence Gu:  Denies bladder incontinence, burning urine Ext:   Denies Joint pain, stiffness or swelling Skin: Denies  skin rash, easy bruising or bleeding or hives Endoc:  Denies polyuria, polydipsia , polyphagia or weight change Psych:   Denies depression, insomnia or hallucinations  Other:  All other systems negative  BP 130/72 (BP Location: Left Arm, Cuff Size: Normal)   Pulse 68   Temp 97.7 F (36.5 C) (Temporal)   Ht 5\' 9"  (1.753 m)   Wt 204 lb 3.2 oz (92.6 kg)   SpO2 97%   BMI 30.16 kg/m   Physical Examination:   General Appearance: No distress  Neuro:without focal findings,  speech normal,  HEENT: PERRLA, EOM intact.   Pulmonary: normal breath sounds, No wheezing.  CardiovascularNormal S1,S2.  No m/r/g.   Abdomen: Benign, Soft, non-tender. Renal:  No costovertebral tenderness  GU:  Not performed at this time. Endoc: No evident thyromegaly Skin:   warm, no rashes, no ecchymosis  Extremities: normal, no cyanosis, clubbing. PSYCHIATRIC: Mood, affect within normal limits.   ALL OTHER ROS ARE NEGATIVE    80 year old pleasant male seen today for follow-up for assessment for severe underlying obstructive sleep apnea    I feel at this time it is necessary to do an in lab CPAP BiPAP  titration sleep study to assess better control of AHI however patient does not have any significant daytime sleepiness or fatigue Patient states he feels fine However he agrees to in lab CPAP titration study  Has excellent compliance report with his CPAP therapy Compliance report reviewed in detail with patient Auto CPAP 15-20 No significant leaks AHI is down to 12 but will need in lab CPAP/biPAP titration study, patient may have underlying central sleep apnea   MEDICATION ADJUSTMENTS/LABS AND TESTS ORDERED: Continue CPAP as prescribed but will need in lab CPAP/biPAP titration sleep study   CURRENT MEDICATIONS REVIEWED AT LENGTH WITH PATIENT TODAY   Follow-up in 1 year  Total time spent 24 mins  Sarely Stracener Patricia Pesa, M.D.  [facility]  Medical Director Margaret Director [facility] Department

## 2021-03-15 NOTE — Patient Instructions (Addendum)
Patient will need In lab CPAP/BIPAP titration sleep study

## 2021-03-30 ENCOUNTER — Ambulatory Visit: Payer: Medicare Other | Attending: Pulmonary Disease

## 2021-03-30 DIAGNOSIS — G4733 Obstructive sleep apnea (adult) (pediatric): Secondary | ICD-10-CM | POA: Diagnosis not present

## 2021-03-30 DIAGNOSIS — Z6830 Body mass index (BMI) 30.0-30.9, adult: Secondary | ICD-10-CM | POA: Insufficient documentation

## 2021-03-30 DIAGNOSIS — Z9989 Dependence on other enabling machines and devices: Secondary | ICD-10-CM | POA: Insufficient documentation

## 2021-03-31 ENCOUNTER — Other Ambulatory Visit: Payer: Self-pay

## 2021-04-02 ENCOUNTER — Other Ambulatory Visit: Payer: Self-pay | Admitting: Internal Medicine

## 2021-04-13 DIAGNOSIS — Z961 Presence of intraocular lens: Secondary | ICD-10-CM | POA: Diagnosis not present

## 2021-04-14 ENCOUNTER — Telehealth (HOSPITAL_BASED_OUTPATIENT_CLINIC_OR_DEPARTMENT_OTHER): Payer: Medicare Other | Admitting: Pulmonary Disease

## 2021-04-14 DIAGNOSIS — G4733 Obstructive sleep apnea (adult) (pediatric): Secondary | ICD-10-CM

## 2021-04-14 NOTE — Telephone Encounter (Signed)
CPAP 18 cm required on titration study Recommend auto CPAP 10-18 cm, full face mask

## 2021-04-26 DIAGNOSIS — M1811 Unilateral primary osteoarthritis of first carpometacarpal joint, right hand: Secondary | ICD-10-CM | POA: Diagnosis not present

## 2021-05-17 ENCOUNTER — Other Ambulatory Visit: Payer: Self-pay | Admitting: Internal Medicine

## 2021-05-18 ENCOUNTER — Ambulatory Visit (INDEPENDENT_AMBULATORY_CARE_PROVIDER_SITE_OTHER): Payer: Medicare Other | Admitting: Primary Care

## 2021-05-18 ENCOUNTER — Encounter: Payer: Self-pay | Admitting: Primary Care

## 2021-05-18 ENCOUNTER — Other Ambulatory Visit: Payer: Self-pay

## 2021-05-18 DIAGNOSIS — G4733 Obstructive sleep apnea (adult) (pediatric): Secondary | ICD-10-CM

## 2021-05-18 NOTE — Patient Instructions (Addendum)
Nice meeting you today  Apneic events were eliminated with CPAP pressure set at 15 cm h20   Continue auto CPAP 15-20 and focus on side sleeping   Follow-up 6 month televisit with Beth NP    Sleep Apnea Sleep apnea affects breathing during sleep. It causes breathing to stop for 10 seconds or more, or to become shallow. People with sleep apnea usually snoreloudly. It can also increase the risk of: Heart attack. Stroke. Being very overweight (obese). Diabetes. Heart failure. Irregular heartbeat. High blood pressure. The goal of treatment is to help you breathe normally again. What are the causes?  The most common cause of this condition is a collapsed or blocked airway. There are three kinds of sleep apnea: Obstructive sleep apnea. This is caused by a blocked or collapsed airway. Central sleep apnea. This happens when the brain does not send the right signals to the muscles that control breathing. Mixed sleep apnea. This is a combination of obstructive and central sleep apnea. What increases the risk? Being overweight. Smoking. Having a small airway. Being older. Being male. Drinking alcohol. Taking medicines to calm yourself (sedatives or tranquilizers). Having family members with the condition. Having a tongue or tonsils that are larger than normal. What are the signs or symptoms? Trouble staying asleep. Loud snoring. Headaches in the morning. Waking up gasping. Dry mouth or sore throat in the morning. Being sleepy or tired during the day. If you are sleepy or tired during the day, you may also: Not be able to focus your mind (concentrate). Forget things. Get angry a lot and have mood swings. Feel sad (depressed). Have changes in your personality. Have less interest in sex, if you are male. Be unable to have an erection, if you are male. How is this treated?  Sleeping on your side. Using a medicine to get rid of mucus in your nose (decongestant). Avoiding the  use of alcohol, medicines to help you relax, or certain pain medicines (narcotics). Losing weight, if needed. Changing your diet. Quitting smoking. Using a machine to open your airway while you sleep, such as: An oral appliance. This is a mouthpiece that shifts your lower jaw forward. A CPAP device. This device blows air through a mask when you breathe out (exhale). An EPAP device. This has valves that you put in each nostril. A BPAP device. This device blows air through a mask when you breathe in (inhale) and breathe out. Having surgery if other treatments do not work. Follow these instructions at home: Lifestyle Make changes that your doctor recommends. Eat a healthy diet. Lose weight if needed. Avoid alcohol, medicines to help you relax, and some pain medicines. Do not smoke or use any products that contain nicotine or tobacco. If you need help quitting, ask your doctor. General instructions Take over-the-counter and prescription medicines only as told by your doctor. If you were given a machine to use while you sleep, use it only as told by your doctor. If you are having surgery, make sure to tell your doctor you have sleep apnea. You may need to bring your device with you. Keep all follow-up visits. Contact a doctor if: The machine that you were given to use during sleep bothers you or does not seem to be working. You do not get better. You get worse. Get help right away if: Your chest hurts. You have trouble breathing in enough air. You have an uncomfortable feeling in your back, arms, or stomach. You have trouble talking. One side  of your body feels weak. A part of your face is hanging down. These symptoms may be an emergency. Get help right away. Call your local emergency services (911 in the [place] Do not wait to see if the symptoms will go away. Do not drive yourself to the hospital. Summary This condition affects breathing during sleep. The most common cause is a  collapsed or blocked airway. The goal of treatment is to help you breathe normally while you sleep. This information is not intended to replace advice given to you by your health care provider. Make sure you discuss any questions you have with your healthcare provider. Document Revised: 10/08/2020 Document Reviewed: 10/08/2020 Elsevier Patient Education  2022 [employer].

## 2021-05-18 NOTE — Progress Notes (Signed)
@Patient  ID: Jamie Zhang, male    DOB: 27-Dec-1940, 80 y.o.   MRN: [id number]  No chief complaint on file.   Referring provider: Crecencio Mc, MD  HPI: 80 year old male, former smoker quit in January 1980 (42 pack year hx). PMH significant for OSA, HTN, RLS. Patient of Dr. Mortimer Zhang, last seen in May 2022.  Home sleep study in 2019 showed severe obstructive sleep apnea.  He had residual AHI of 12/hr on CPAP download so was recommended to have repeat titration study.  05/18/2021- Interim hx  Patient presents today to review sleep study.  He underwent CPAP titration study on 03/30/2021.  Total apnea hypopnea index was 10.6/hr (supine AHI was 55.3/hr).  SPO2 low 89%. Apneic events were eliminated with CPAP set at 15 cm h20.  He is currently on auto CPAP with a pressure set at 15-20 cm H2O.  He has residual AHI 1.2/hr. Prior to sleep study he was sleeping on his back and his average nightly AHI was between 10-20/hr. After sleep study he started sleeping on his side and his apneic events were significantly decreased.  He is having no issues with pressure setting. He uses full face mask.   Airview download 04/12/2021 - 05/11/2021 Usage days 30/30; percent greater than 4 hours Average usage 7 hours 38 minutes Pressure 15-20cm h20 (16cm h20-95%) Airleaks 1.2L/min (95%) AHI 1.2   No Known Allergies  Immunization History  Administered Date(s) Administered   Fluad Quad(high Dose 65+) 07/28/2019   Influenza Split 09/01/2015   Influenza, High Dose Seasonal PF 08/14/2016, 08/19/2018   Influenza-Unspecified 08/25/2014, 08/01/2017, 08/10/2020   PFIZER(Purple Top)SARS-COV-2 Vaccination 11/28/2019, 12/17/2019, 08/19/2020   Pneumococcal Conjugate-13 05/04/2015   Pneumococcal Polysaccharide-23 09/19/2005   Tdap 08/08/2010   Zoster Recombinat (Shingrix) 07/10/2018, 09/18/2018    Past Medical History:  Diagnosis Date   Arthritis    Cataract 01/02/2011   Bilateral - s/p excision   Colon polyps 2000    Hyperplastic - Repeat colonoscopy 2015   GERD (gastroesophageal reflux disease)    Pantoprazole   Hemorrhoids 2006   HLD (hyperlipidemia)    Iron deficiency anemia 03/20/2006   Donating too much blood   Migraine    No longer has problems   Osteopenia    on Fosamax   RLS (restless legs syndrome) 03/20/2006   2/2 iron deficiency   Schatzki's ring    Skin cancer 01/04/2012   Basal cell nose, squamous cell scalp - Dr. Ula Lingo   Sleep apnea     Tobacco History: Social History   Tobacco Use  Smoking Status Former   Packs/day: 2.00   Years: 21.00   Pack years: 42.00   Types: Cigarettes   Quit date: 1980   Years since quitting: 42.5  Smokeless Tobacco Never   Counseling given: Not Answered   Outpatient Medications Prior to Visit  Medication Sig Dispense Refill   aspirin EC 81 MG tablet Take 81 mg by mouth daily.     atorvastatin (LIPITOR) 20 MG tablet TAKE 1 TABLET(20 MG) BY MOUTH DAILY 90 tablet 1   Calcium Citrate-Vitamin D (CITRACAL + D PO) Take 1 tablet by mouth daily.     Cholecalciferol (VITAMIN D3) 10000 UNITS TABS Take 10,000 Units by mouth daily.     diclofenac (VOLTAREN) 75 MG EC tablet Take 1 tablet (75 mg total) by mouth 2 (two) times daily. (Patient taking differently: Take 75 mg by mouth 2 (two) times daily as needed for moderate pain.) 60 tablet 2   docusate  sodium (COLACE) 100 MG capsule Take 100 mg by mouth every other day.     famotidine (PEPCID) 40 MG tablet Take 1 tablet (40 mg total) by mouth daily. 90 tablet 3   fluticasone (FLONASE) 50 MCG/ACT nasal spray Place 2 sprays into both nostrils daily.     losartan (COZAAR) 100 MG tablet losartan 100 mg tablet     losartan (COZAAR) 50 MG tablet TAKE 1 TABLET(50 MG) BY MOUTH AT BEDTIME 90 tablet 0   meclizine (ANTIVERT) 12.5 MG tablet TAKE 1 TABLET THREE TIMES DAILY AS NEEDED FOR DIZZINESS (Patient taking differently: Take 12.5 mg by mouth 3 (three) times daily as needed for dizziness.) 90 tablet 0    Facility-Administered Medications Prior to Visit  Medication Dose Route Frequency Provider Last Rate Last Admin   dexamethasone (DECADRON) injection 4 mg  4 mg Other Once Criselda Peaches, DPM       triamcinolone acetonide (KENALOG) 10 MG/ML injection 10 mg  10 mg Other Once Criselda Peaches, DPM        Review of Systems  Review of Systems  Constitutional: Negative.   HENT: Negative.    Respiratory: Negative.      Physical Exam  BP 116/66 (BP Location: Left Arm, Patient Position: Sitting, Cuff Size: Normal)   Pulse 60   Temp 98.2 F (36.8 C) (Oral)   Ht 5\' 9"  (1.753 m)   Wt 205 lb 9.6 oz (93.3 kg)   SpO2 97%   BMI 30.36 kg/m  Physical Exam Constitutional:      Appearance: Normal appearance.  HENT:     Head: Normocephalic and atraumatic.     Mouth/Throat:     Mouth: Mucous membranes are moist.     Pharynx: Oropharynx is clear.  Cardiovascular:     Rate and Rhythm: Normal rate and regular rhythm.  Pulmonary:     Effort: Pulmonary effort is normal.     Breath sounds: Normal breath sounds.  Neurological:     General: No focal deficit present.     Mental Status: He is alert and oriented to person, place, and time. Mental status is at baseline.  Psychiatric:        Mood and Affect: Mood normal.        Behavior: Behavior normal.        Thought Content: Thought content normal.        Judgment: Judgment normal.     Lab Results:  CBC    Component Value Date/Time   WBC 14.1 (H) 11/04/2020 0613   RBC 4.51 11/04/2020 0613   HGB 14.4 11/04/2020 0613   HGB 14.6 03/13/2014 1505   HCT 42.1 11/04/2020 0613   HCT 42.5 03/13/2014 1505   PLT 164 11/04/2020 0613   PLT 204 03/13/2014 1505   MCV 93.3 11/04/2020 0613   MCV 93 03/13/2014 1505   MCH 31.9 11/04/2020 0613   MCHC 34.2 11/04/2020 0613   RDW 13.2 11/04/2020 0613   RDW 14.0 03/13/2014 1505   LYMPHSABS 1.0 11/01/2020 1121   LYMPHSABS 1.1 03/13/2014 1505   MONOABS 0.6 11/01/2020 1121   MONOABS 0.6 03/13/2014  1505   EOSABS 0.1 11/01/2020 1121   EOSABS 0.1 03/13/2014 1505   BASOSABS 0.1 11/01/2020 1121   BASOSABS 0.1 03/13/2014 1505    BMET    Component Value Date/Time   NA 141 02/01/2021 1335   NA 143 12/07/2020 1415   NA 140 03/13/2014 1505   K 4.0 02/01/2021 1335   K 3.8  03/13/2014 1505   CL 106 02/01/2021 1335   CL 108 (H) 03/13/2014 1505   CO2 29 02/01/2021 1335   CO2 25 03/13/2014 1505   GLUCOSE 120 (H) 02/01/2021 1335   GLUCOSE 153 (H) 03/13/2014 1505   BUN 24 (H) 02/01/2021 1335   BUN 19 12/07/2020 1415   BUN 24 (H) 03/13/2014 1505   CREATININE 1.16 02/01/2021 1335   CREATININE 1.15 03/13/2014 1505   CALCIUM 9.7 02/01/2021 1335   CALCIUM 9.1 03/13/2014 1505   GFRNONAA 58 (L) 12/07/2020 1415   GFRNONAA >60 11/04/2020 0613   GFRNONAA >60 03/13/2014 1505   GFRAA 67 12/07/2020 1415   GFRAA >60 03/13/2014 1505    BNP No results found for: BNP  ProBNP No results found for: PROBNP  Imaging: SLEEP STUDY DOCUMENTS  Result Date: 05/04/2021 Ordered by an unspecified provider.    Assessment & Plan:   OSA (obstructive sleep apnea) - Patient underwent CPAP titration study in May 2022, total apnea hypopnea index was 10.6/hr (supine AHI was 55.3/hr).  Apneic events were eliminated with CPAP pressure set at 15 cm h20. He has noticed significant decrease in events with side sleeping position. Pressure 15-20cm h20 (16cm h20-95%); residual AHI 1.2. Continue auto CPAP 15-20. Follow-up 6 month televisit with Beth NP for compliance check.     Martyn Ehrich, NP 05/19/2021

## 2021-05-19 ENCOUNTER — Encounter: Payer: Self-pay | Admitting: Primary Care

## 2021-05-19 NOTE — Assessment & Plan Note (Signed)
-   Patient underwent CPAP titration study in May 2022, total apnea hypopnea index was 10.6/hr (supine AHI was 55.3/hr).  Apneic events were eliminated with CPAP pressure set at 15 cm h20. He has noticed significant decrease in events with side sleeping position. Pressure 15-20cm h20 (16cm h20-95%); residual AHI 1.2. Continue auto CPAP 15-20. Follow-up 6 month televisit with Beth NP for compliance check.

## 2021-07-01 ENCOUNTER — Other Ambulatory Visit: Payer: Self-pay | Admitting: Internal Medicine

## 2021-07-16 ENCOUNTER — Other Ambulatory Visit: Payer: Self-pay | Admitting: Internal Medicine

## 2021-07-17 ENCOUNTER — Other Ambulatory Visit: Payer: Self-pay | Admitting: Internal Medicine

## 2021-07-19 ENCOUNTER — Other Ambulatory Visit: Payer: Self-pay

## 2021-07-19 MED ORDER — LOSARTAN POTASSIUM 50 MG PO TABS: 50.0000 mg | ORAL_TABLET | Freq: Every day | ORAL | 0 refills | Status: DC

## 2021-08-04 ENCOUNTER — Ambulatory Visit: Payer: Medicare Other | Admitting: Internal Medicine

## 2021-08-12 ENCOUNTER — Encounter: Payer: Self-pay | Admitting: Internal Medicine

## 2021-08-12 ENCOUNTER — Telehealth: Payer: Self-pay

## 2021-08-12 ENCOUNTER — Ambulatory Visit (INDEPENDENT_AMBULATORY_CARE_PROVIDER_SITE_OTHER): Payer: Medicare Other | Admitting: Internal Medicine

## 2021-08-12 VITALS — Ht 69.0 in | Wt 204.0 lb

## 2021-08-12 DIAGNOSIS — Z8781 Personal history of (healed) traumatic fracture: Secondary | ICD-10-CM

## 2021-08-12 DIAGNOSIS — G6289 Other specified polyneuropathies: Secondary | ICD-10-CM

## 2021-08-12 DIAGNOSIS — E785 Hyperlipidemia, unspecified: Secondary | ICD-10-CM

## 2021-08-12 DIAGNOSIS — R7303 Prediabetes: Secondary | ICD-10-CM

## 2021-08-12 DIAGNOSIS — I7143 Infrarenal abdominal aortic aneurysm, without rupture: Secondary | ICD-10-CM

## 2021-08-12 DIAGNOSIS — M810 Age-related osteoporosis without current pathological fracture: Secondary | ICD-10-CM

## 2021-08-12 DIAGNOSIS — E781 Pure hyperglyceridemia: Secondary | ICD-10-CM | POA: Diagnosis not present

## 2021-08-12 DIAGNOSIS — I10 Essential (primary) hypertension: Secondary | ICD-10-CM | POA: Diagnosis not present

## 2021-08-12 DIAGNOSIS — E559 Vitamin D deficiency, unspecified: Secondary | ICD-10-CM | POA: Diagnosis not present

## 2021-08-12 NOTE — Progress Notes (Signed)
Telephone  Note  This visit type was conducted due to national recommendations for restrictions regarding the COVID-19 pandemic (e.g. social distancing).  This format is felt to be most appropriate for this patient at this time.  All issues noted in this document were discussed and addressed.  No physical exam was performed (except for noted visual exam findings with Video Visits).   I connected withNAME@ on 08/12/21 at  2:00 PM EDT by  telemedicine  telephone and verified that I am speaking with the correct person using two identifiers. Location patient: home Location provider: work or home office Persons participating in the virtual visit: patient, provider  I discussed the limitations, risks, security and privacy concerns of performing an evaluation and management service by telephone and the availability of in person appointments. I also discussed with the patient that there may be a patient responsible charge related to this service. The patient expressed understanding and agreed to proceed.  Reason for visit: follow up  HPI:   Chronic pain secondary to OA  ; feels better after 30 minutes on the treadmill.  Started using OsteoBiflex  daily a few weeks ago and back pain has improved.   Osteoporosis: started alendronate in 2014.  stopped taking alendronate 3 years ago, last DEXA 2019     HTN:/AAA reading today 137/73 on losartan 50 mg currently   AAA: s/p endovascular repair in December . Repeat ultrasound is scheduled with Dr Lucky Cowboy I n October   OSA:  on new CPAP , managed by Pulmonary  Weight gain of 5 lbs during trip up [place] visiting family   Getting flu vaccine Oct 12 and COVID booster.    ROS: See pertinent positives and negatives per HPI.  Past Medical History:  Diagnosis Date   Arthritis    Cataract 01/02/2011   Bilateral - s/p excision   Colon polyps 2000   Hyperplastic - Repeat colonoscopy 2015   GERD (gastroesophageal reflux disease)    Pantoprazole    Hemorrhoids 2006   HLD (hyperlipidemia)    Iron deficiency anemia 03/20/2006   Donating too much blood   Migraine    No longer has problems   Osteopenia    on Fosamax   RLS (restless legs syndrome) 03/20/2006   2/2 iron deficiency   Schatzki's ring    Skin cancer 01/04/2012   Basal cell nose, squamous cell scalp - Dr. Ula Lingo   Sleep apnea     Past Surgical History:  Procedure Laterality Date   BREAST BIOPSY Left    benign   Cataract Surgery Bilateral    COLONOSCOPY WITH PROPOFOL N/A 04/18/2017   Procedure: COLONOSCOPY WITH PROPOFOL;  Surgeon: Robert Bellow, MD;  Location: [facility];  Service: Endoscopy;  Laterality: N/A;   ENDOVASCULAR REPAIR/STENT GRAFT N/A 11/03/2020   Procedure: ENDOVASCULAR REPAIR/STENT GRAFT;  Surgeon: Algernon Huxley, MD;  Location: [facility];  Service: Cardiovascular;  Laterality: N/A;   INGUINAL HERNIA REPAIR Left    ROTATOR CUFF REPAIR Left     Family History  Problem Relation Age of Onset   Alcohol abuse Mother    Arthritis Mother    Hypertension Mother    Cancer Father 40       Throat Cancer - died   Alcohol abuse Father    Cancer Sister 34       Breast Cancer   Alcohol abuse Sister     SOCIAL HX:  reports that he quit smoking about 42 years ago. His smoking use  included cigarettes. He has a 42.00 pack-year smoking history. He has never used smokeless tobacco. He reports current alcohol use. He reports that he does not use drugs.    Current Outpatient Medications:    aspirin EC 81 MG tablet, Take 81 mg by mouth daily., Disp: , Rfl:    atorvastatin (LIPITOR) 20 MG tablet, TAKE 1 TABLET(20 MG) BY MOUTH DAILY, Disp: 90 tablet, Rfl: 1   Calcium Citrate-Vitamin D (CITRACAL + D PO), Take 1 tablet by mouth daily., Disp: , Rfl:    Cholecalciferol (VITAMIN D3) 10000 UNITS TABS, Take 10,000 Units by mouth daily., Disp: , Rfl:    diclofenac (VOLTAREN) 75 MG EC tablet, Take 1 tablet (75 mg total) by mouth 2 (two) times daily. (Patient  taking differently: Take 75 mg by mouth 2 (two) times daily as needed for moderate pain.), Disp: 60 tablet, Rfl: 2   docusate sodium (COLACE) 100 MG capsule, Take 100 mg by mouth every other day., Disp: , Rfl:    famotidine (PEPCID) 40 MG tablet, TAKE 1 TABLET(40 MG) BY MOUTH DAILY, Disp: 90 tablet, Rfl: 3   fluticasone (FLONASE) 50 MCG/ACT nasal spray, SHAKE LIQUID AND USE 2 SPRAYS IN EACH NOSTRIL DAILY, Disp: 48 g, Rfl: 1   losartan (COZAAR) 50 MG tablet, Take 1 tablet (50 mg total) by mouth daily., Disp: 90 tablet, Rfl: 0   meclizine (ANTIVERT) 12.5 MG tablet, TAKE 1 TABLET THREE TIMES DAILY AS NEEDED FOR DIZZINESS (Patient taking differently: Take 12.5 mg by mouth 3 (three) times daily as needed for dizziness.), Disp: 90 tablet, Rfl: 0  Current Facility-Administered Medications:    dexamethasone (DECADRON) injection 4 mg, 4 mg, Other, Once, McDonald, Adam R, DPM   triamcinolone acetonide (KENALOG) 10 MG/ML injection 10 mg, 10 mg, Other, Once, McDonald, Stephan Minister, DPM  EXAM:  VITALS per patient if applicable:  GENERAL: alert, oriented, appears well and in no acute distress  HEENT: atraumatic, conjunttiva clear, no obvious abnormalities on inspection of external nose and ears  NECK: normal movements of the head and neck  LUNGS: on inspection no signs of respiratory distress, breathing rate appears normal, no obvious gross SOB, gasping or wheezing  CV: no obvious cyanosis  MS: moves all visible extremities without noticeable abnormality  PSYCH/NEURO: pleasant and cooperative, no obvious depression or anxiety, speech and thought processing grossly intact  ASSESSMENT AND PLAN:  Discussed the following assessment and plan:  Age-related osteoporosis without current pathological fracture - Plan: DG Bone Density  Prediabetes - Plan: Comprehensive metabolic panel, Hemoglobin A1c  Hyperlipidemia LDL goal <100 - Plan: Lipid panel  Vitamin D deficiency - Plan: VITAMIN D 25 Hydroxy (Vit-D  Deficiency, Fractures)  Infrarenal abdominal aortic aneurysm (AAA) without rupture  History of vertebral compression fracture  Pure hypertriglyceridemia  Other polyneuropathy  Primary hypertension  AAA (abdominal aortic aneurysm) without rupture S/p endovascular repair and stent  Dec 2021  History of vertebral compression fracture He has been taking alendronate since 2014 and T scores note mild osteopenia.  Last DEXA 2019 when he stopped the medication.   Marland KitchenDEXA reordered  HLD (hyperlipidemia) Managed with statin therapy,  LDL is at goal of < 70. Overdue for labs  Lab Results  Component Value Date   CHOL 111 07/26/2020   HDL 49.70 07/26/2020   LDLCALC 44 07/26/2020   LDLDIRECT 47.0 12/24/2015   TRIG 89.0 07/26/2020   CHOLHDL 2 07/26/2020    Lab Results  Component Value Date   ALT 17 02/01/2021   AST  15 02/01/2021   ALKPHOS 51 02/01/2021   BILITOT 1.6 (H) 02/01/2021      Peripheral neuropathy Symptoms started after surgery for Morton's neuroma nearly  30 yrs ago  And have not progressed beyond  loss of sensation on palmar surface of both feet  Hypertension Improved control on current regimen.  No changes today.  Lab Results  Component Value Date   CREATININE 1.16 02/01/2021   Lab Results  Component Value Date   NA 141 02/01/2021   K 4.0 02/01/2021   CL 106 02/01/2021   CO2 29 02/01/2021       I discussed the assessment and treatment plan with the patient. The patient was provided an opportunity to ask questions and all were answered. The patient agreed with the plan and demonstrated an understanding of the instructions.   The patient was advised to call back or seek an in-person evaluation if the symptoms worsen or if the condition fails to improve as anticipated.   I spent 30 minutes dedicated to the care of this patient on the date of this encounter to include pre-visit review of his medical history,  Face-to-face time with the patient , and post visit  ordering of testing and therapeutics.    Crecencio Mc, MD

## 2021-08-12 NOTE — Telephone Encounter (Signed)
LMTCB. Need to see if pt is okay with changing his appt to a virtual or telephone visit today.

## 2021-08-13 NOTE — Assessment & Plan Note (Signed)
He has been taking alendronate since 2014 and T scores note mild osteopenia.  Last DEXA 2019 when he stopped the medication.   Marland KitchenDEXA reordered

## 2021-08-13 NOTE — Assessment & Plan Note (Signed)
S/p endovascular repair and stent  Dec 2021

## 2021-08-13 NOTE — Assessment & Plan Note (Signed)
Improved control on current regimen.  No changes today.  Lab Results  Component Value Date   CREATININE 1.16 02/01/2021   Lab Results  Component Value Date   NA 141 02/01/2021   K 4.0 02/01/2021   CL 106 02/01/2021   CO2 29 02/01/2021

## 2021-08-13 NOTE — Assessment & Plan Note (Signed)
Managed with statin therapy,  LDL is at goal of < 70. Overdue for labs  Lab Results  Component Value Date   CHOL 111 07/26/2020   HDL 49.70 07/26/2020   LDLCALC 44 07/26/2020   LDLDIRECT 47.0 12/24/2015   TRIG 89.0 07/26/2020   CHOLHDL 2 07/26/2020    Lab Results  Component Value Date   ALT 17 02/01/2021   AST 15 02/01/2021   ALKPHOS 51 02/01/2021   BILITOT 1.6 (H) 02/01/2021

## 2021-08-13 NOTE — Assessment & Plan Note (Signed)
Symptoms started after surgery for Morton's neuroma nearly  30 yrs ago  And have not progressed beyond  loss of sensation on palmar surface of both feet

## 2021-08-17 ENCOUNTER — Ambulatory Visit: Payer: Medicare Other

## 2021-08-24 DIAGNOSIS — Z23 Encounter for immunization: Secondary | ICD-10-CM | POA: Diagnosis not present

## 2021-08-25 ENCOUNTER — Other Ambulatory Visit (INDEPENDENT_AMBULATORY_CARE_PROVIDER_SITE_OTHER): Payer: Medicare Other

## 2021-08-25 ENCOUNTER — Other Ambulatory Visit: Payer: Self-pay

## 2021-08-25 DIAGNOSIS — E559 Vitamin D deficiency, unspecified: Secondary | ICD-10-CM

## 2021-08-25 DIAGNOSIS — E785 Hyperlipidemia, unspecified: Secondary | ICD-10-CM

## 2021-08-25 DIAGNOSIS — R7303 Prediabetes: Secondary | ICD-10-CM

## 2021-08-25 LAB — HEMOGLOBIN A1C: Hgb A1c MFr Bld: 6 % (ref 4.6–6.5)

## 2021-08-25 LAB — COMPREHENSIVE METABOLIC PANEL
ALT: 20 U/L (ref 0–53)
AST: 21 U/L (ref 0–37)
Albumin: 4.3 g/dL (ref 3.5–5.2)
Alkaline Phosphatase: 56 U/L (ref 39–117)
BUN: 20 mg/dL (ref 6–23)
CO2: 30 mEq/L (ref 19–32)
Calcium: 9.3 mg/dL (ref 8.4–10.5)
Chloride: 107 mEq/L (ref 96–112)
Creatinine, Ser: 1.15 mg/dL (ref 0.40–1.50)
GFR: 59.75 mL/min — ABNORMAL LOW (ref >60.00)
Glucose, Bld: 98 mg/dL (ref 70–99)
Potassium: 4 mEq/L (ref 3.5–5.1)
Sodium: 141 mEq/L (ref 135–145)
Total Bilirubin: 1.5 mg/dL — ABNORMAL HIGH (ref 0.2–1.2)
Total Protein: 6.6 g/dL (ref 6.0–8.3)

## 2021-08-25 LAB — LIPID PANEL
Cholesterol: 103 mg/dL (ref 0–200)
HDL: 57.3 mg/dL (ref >39.00)
LDL Cholesterol: 36 mg/dL (ref 0–99)
NonHDL: 45.68
Total CHOL/HDL Ratio: 2
Triglycerides: 50 mg/dL (ref 0.0–149.0)
VLDL: 10 mg/dL (ref 0.0–40.0)

## 2021-08-25 LAB — VITAMIN D 25 HYDROXY (VIT D DEFICIENCY, FRACTURES): VITD: 39.13 ng/mL (ref 30.00–100.00)

## 2021-08-27 DIAGNOSIS — N1831 Chronic kidney disease, stage 3a: Secondary | ICD-10-CM

## 2021-08-30 ENCOUNTER — Ambulatory Visit (INDEPENDENT_AMBULATORY_CARE_PROVIDER_SITE_OTHER): Payer: Medicare Other

## 2021-08-30 ENCOUNTER — Other Ambulatory Visit: Payer: Self-pay

## 2021-08-30 ENCOUNTER — Ambulatory Visit (INDEPENDENT_AMBULATORY_CARE_PROVIDER_SITE_OTHER): Payer: Medicare Other | Admitting: Vascular Surgery

## 2021-08-30 VITALS — BP 120/77 | HR 48 | Ht 69.0 in | Wt 204.0 lb

## 2021-08-30 DIAGNOSIS — I10 Essential (primary) hypertension: Secondary | ICD-10-CM | POA: Diagnosis not present

## 2021-08-30 DIAGNOSIS — E781 Pure hyperglyceridemia: Secondary | ICD-10-CM

## 2021-08-30 DIAGNOSIS — I7143 Infrarenal abdominal aortic aneurysm, without rupture: Secondary | ICD-10-CM

## 2021-08-30 DIAGNOSIS — I714 Abdominal aortic aneurysm, without rupture, unspecified: Secondary | ICD-10-CM | POA: Diagnosis not present

## 2021-08-30 DIAGNOSIS — R7303 Prediabetes: Secondary | ICD-10-CM

## 2021-08-30 NOTE — Progress Notes (Signed)
MRN : [id number]  Jamie Zhang is a 80 y.o. (15-Jul-1941) male who presents with chief complaint of  Chief Complaint  Patient presents with   Follow-up    6 Mo evar  .  History of Present Illness: Patient returns today in follow up of his aneurysm.  He is about 10 months status post endovascular abdominal aortic aneurysm repair.  At that time, he did have to have a lower pole renal artery embolized and he has had some reduction in his renal function.  His GFR prior to the procedure was roughly 70, and it is roughly 60 now.  His blood pressure is well controlled.  He is otherwise doing well.  Duplex today shows a patent stent graft without endoleak and a maximal aortic sac size which is stable at 4.3 cm.  Current Outpatient Medications  Medication Sig Dispense Refill   aspirin EC 81 MG tablet Take 81 mg by mouth daily.     atorvastatin (LIPITOR) 20 MG tablet TAKE 1 TABLET(20 MG) BY MOUTH DAILY 90 tablet 1   Calcium Citrate-Vitamin D (CITRACAL + D PO) Take 1 tablet by mouth daily.     Cholecalciferol (VITAMIN D3) 10000 UNITS TABS Take 10,000 Units by mouth daily.     diclofenac (VOLTAREN) 75 MG EC tablet Take 1 tablet (75 mg total) by mouth 2 (two) times daily. (Patient taking differently: Take 75 mg by mouth 2 (two) times daily as needed for moderate pain.) 60 tablet 2   docusate sodium (COLACE) 100 MG capsule Take 100 mg by mouth every other day.     famotidine (PEPCID) 40 MG tablet TAKE 1 TABLET(40 MG) BY MOUTH DAILY 90 tablet 3   fluticasone (FLONASE) 50 MCG/ACT nasal spray SHAKE LIQUID AND USE 2 SPRAYS IN EACH NOSTRIL DAILY 48 g 1   losartan (COZAAR) 50 MG tablet Take 1 tablet (50 mg total) by mouth daily. 90 tablet 0   meclizine (ANTIVERT) 12.5 MG tablet TAKE 1 TABLET THREE TIMES DAILY AS NEEDED FOR DIZZINESS (Patient taking differently: Take 12.5 mg by mouth 3 (three) times daily as needed for dizziness.) 90 tablet 0   Current Facility-Administered Medications  Medication Dose Route  Frequency Provider Last Rate Last Admin   dexamethasone (DECADRON) injection 4 mg  4 mg Other Once Criselda Peaches, DPM       triamcinolone acetonide (KENALOG) 10 MG/ML injection 10 mg  10 mg Other Once Criselda Peaches, DPM        Past Medical History:  Diagnosis Date   Arthritis    Cataract 01/02/2011   Bilateral - s/p excision   Colon polyps 2000   Hyperplastic - Repeat colonoscopy 2015   GERD (gastroesophageal reflux disease)    Pantoprazole   Hemorrhoids 2006   HLD (hyperlipidemia)    Iron deficiency anemia 03/20/2006   Donating too much blood   Migraine    No longer has problems   Osteopenia    on Fosamax   RLS (restless legs syndrome) 03/20/2006   2/2 iron deficiency   Schatzki's ring    Skin cancer 01/04/2012   Basal cell nose, squamous cell scalp - Dr. Ula Lingo   Sleep apnea     Past Surgical History:  Procedure Laterality Date   BREAST BIOPSY Left    benign   Cataract Surgery Bilateral    COLONOSCOPY WITH PROPOFOL N/A 04/18/2017   Procedure: COLONOSCOPY WITH PROPOFOL;  Surgeon: Robert Bellow, MD;  Location: [facility];  Service: Endoscopy;  Laterality: N/A;   ENDOVASCULAR REPAIR/STENT GRAFT N/A 11/03/2020   Procedure: ENDOVASCULAR REPAIR/STENT GRAFT;  Surgeon: Algernon Huxley, MD;  Location: [facility];  Service: Cardiovascular;  Laterality: N/A;   INGUINAL HERNIA REPAIR Left    ROTATOR CUFF REPAIR Left      Social History   Tobacco Use   Smoking status: Former    Packs/day: 2.00    Years: 21.00    Pack years: 42.00    Types: Cigarettes    Quit date: 1980    Years since quitting: 42.8   Smokeless tobacco: Never  Vaping Use   Vaping Use: Never used  Substance Use Topics   Alcohol use: Yes    Comment: Occasional wine glass   Drug use: No       Family History  Problem Relation Age of Onset   Alcohol abuse Mother    Arthritis Mother    Hypertension Mother    Cancer Father 4       Throat Cancer - died   Alcohol abuse Father     Cancer Sister 54       Breast Cancer   Alcohol abuse Sister      No Known Allergies  REVIEW OF SYSTEMS (Negative unless checked)   Constitutional: [] Weight loss  [] Fever  [] Chills Cardiac: [] Chest pain   [] Chest pressure   [] Palpitations   [] Shortness of breath when laying flat   [] Shortness of breath at rest   [] Shortness of breath with exertion. Vascular:  [] Pain in legs with walking   [] Pain in legs at rest   [] Pain in legs when laying flat   [] Claudication   [] Pain in feet when walking  [] Pain in feet at rest  [] Pain in feet when laying flat   [] History of DVT   [] Phlebitis   [] Swelling in legs   [] Varicose veins   [] Non-healing ulcers Pulmonary:   [] Uses home oxygen   [] Productive cough   [] Hemoptysis   [] Wheeze  [] COPD   [] Asthma Neurologic:  [] Dizziness  [] Blackouts   [] Seizures   [] History of stroke   [] History of TIA  [] Aphasia   [] Temporary blindness   [] Dysphagia   [] Weakness or numbness in arms   [] Weakness or numbness in legs Musculoskeletal:  [x] Arthritis   [] Joint swelling   [] Joint pain   [x] Low back pain Hematologic:  [] Easy bruising  [] Easy bleeding   [] Hypercoagulable state   [] Anemic   Gastrointestinal:  [] Blood in stool   [] Vomiting blood  [x] Gastroesophageal reflux/heartburn   [] Abdominal pain Genitourinary:  [] Chronic kidney disease   [] Difficult urination  [] Frequent urination  [] Burning with urination   [] Hematuria Skin:  [] Rashes   [] Ulcers   [] Wounds Psychological:  [] History of anxiety   []  History of major depression.  Physical Examination  BP 120/77   Pulse (!) 48   Ht 5\' 9"  (1.753 m)   Wt 204 lb (92.5 kg)   BMI 30.13 kg/m  Gen:  WD/WN, NAD. Appears younger than stated age. Head: [place]/AT, No temporalis wasting. Ear/Nose/Throat: Hearing grossly intact, nares w/o erythema or drainage Eyes: Conjunctiva clear. Sclera non-icteric Neck: Supple.  Trachea midline Pulmonary:  Good air movement, no use of accessory muscles.  Cardiac: RRR, no JVD Vascular:   Vessel Right Left  Radial Palpable Palpable           Musculoskeletal: M/S 5/5 throughout.  No deformity or atrophy. No edema. Neurologic: Sensation grossly intact in extremities.  Symmetrical.  Speech is fluent.  Psychiatric: Judgment intact, Mood &  affect appropriate for pt's clinical situation. Dermatologic: No rashes or ulcers noted.  No cellulitis or open wounds.      Labs Recent Results (from the past 2160 hour(s))  Hemoglobin A1c     Status: None   Collection Time: 08/25/21  9:42 AM  Result Value Ref Range   Hgb A1c MFr Bld 6.0 4.6 - 6.5 %    Comment: Glycemic Control Guidelines for People with Diabetes:Non Diabetic:  <6%Goal of Therapy: <7%Additional Action Suggested:  >8%   Lipid panel     Status: None   Collection Time: 08/25/21  9:42 AM  Result Value Ref Range   Cholesterol 103 0 - 200 mg/dL    Comment: ATP III Classification       Desirable:  < 200 mg/dL               Borderline High:  200 - 239 mg/dL          High:  > = 240 mg/dL   Triglycerides 50.0 0.0 - 149.0 mg/dL    Comment: Normal:  <150 mg/dLBorderline High:  150 - 199 mg/dL   HDL 57.30 >39.00 mg/dL   VLDL 10.0 0.0 - 40.0 mg/dL   LDL Cholesterol 36 0 - 99 mg/dL   Total CHOL/HDL Ratio 2     Comment:                Men          Women1/2 Average Risk     3.4          3.3Average Risk          5.0          4.42X Average Risk          9.6          7.13X Average Risk          15.0          11.0                       NonHDL 45.68     Comment: NOTE:  Non-HDL goal should be 30 mg/dL higher than patient's LDL goal (i.e. LDL goal of < 70 mg/dL, would have non-HDL goal of < 100 mg/dL)  Comprehensive metabolic panel     Status: Abnormal   Collection Time: 08/25/21  9:42 AM  Result Value Ref Range   Sodium 141 135 - 145 mEq/L   Potassium 4.0 3.5 - 5.1 mEq/L   Chloride 107 96 - 112 mEq/L   CO2 30 19 - 32 mEq/L   Glucose, Bld 98 70 - 99 mg/dL   BUN 20 6 - 23 mg/dL   Creatinine, Ser 1.15 0.40 - 1.50 mg/dL   Total  Bilirubin 1.5 (H) 0.2 - 1.2 mg/dL   Alkaline Phosphatase 56 39 - 117 U/L   AST 21 0 - 37 U/L   ALT 20 0 - 53 U/L   Total Protein 6.6 6.0 - 8.3 g/dL   Albumin 4.3 3.5 - 5.2 g/dL   GFR 59.75 (L) >60.00 mL/min    Comment: Calculated using the CKD-EPI Creatinine Equation (2021)   Calcium 9.3 8.4 - 10.5 mg/dL  VITAMIN D 25 Hydroxy (Vit-D Deficiency, Fractures)     Status: None   Collection Time: 08/25/21  9:42 AM  Result Value Ref Range   VITD 39.13 30.00 - 100.00 ng/mL    Radiology No results found.  Assessment/Plan Hypertension blood pressure control important  in reducing the progression of atherosclerotic disease and aneurysmal disease. On appropriate oral medications.  HLD (hyperlipidemia) lipid control important in reducing the progression of atherosclerotic disease. Continue statin therapy  Prediabetes blood glucose control important in reducing the progression of atherosclerotic disease. Also, involved in wound healing. On appropriate medications.   AAA (abdominal aortic aneurysm) without rupture Duplex today shows a patent stent graft without endoleak and a maximal aortic sac size which is stable at 4.3 cm.  From a vascular standpoint he is doing well.  We did have to embolize a polar right renal artery which may have led to a small amount of renal function decline as he has seen.  Fortunately, his GFR remains relatively preserved at around 60.  We will continue to follow him with duplex.  We will do another 27-month check and then we can go to an annual follow-up.    Leotis Pain, MD  08/30/2021 11:06 AM    This note was created with Dragon medical transcription system.  Any errors from dictation are purely unintentional

## 2021-08-30 NOTE — Assessment & Plan Note (Signed)
blood glucose control important in reducing the progression of atherosclerotic disease. Also, involved in wound healing. On appropriate medications.  

## 2021-08-30 NOTE — Assessment & Plan Note (Signed)
Duplex today shows a patent stent graft without endoleak and a maximal aortic sac size which is stable at 4.3 cm.  From a vascular standpoint he is doing well.  We did have to embolize a polar right renal artery which may have led to a small amount of renal function decline as he has seen.  Fortunately, his GFR remains relatively preserved at around 60.  We will continue to follow him with duplex.  We will do another 8-month check and then we can go to an annual follow-up.

## 2021-10-12 ENCOUNTER — Other Ambulatory Visit: Payer: Self-pay | Admitting: Internal Medicine

## 2021-10-19 DIAGNOSIS — M150 Primary generalized (osteo)arthritis: Secondary | ICD-10-CM | POA: Insufficient documentation

## 2021-10-19 DIAGNOSIS — Z95828 Presence of other vascular implants and grafts: Secondary | ICD-10-CM | POA: Diagnosis not present

## 2021-10-19 DIAGNOSIS — I10 Essential (primary) hypertension: Secondary | ICD-10-CM | POA: Diagnosis not present

## 2021-10-19 DIAGNOSIS — Z9862 Peripheral vascular angioplasty status: Secondary | ICD-10-CM | POA: Diagnosis not present

## 2021-10-19 DIAGNOSIS — Z8679 Personal history of other diseases of the circulatory system: Secondary | ICD-10-CM | POA: Insufficient documentation

## 2021-10-19 DIAGNOSIS — E785 Hyperlipidemia, unspecified: Secondary | ICD-10-CM | POA: Diagnosis not present

## 2021-10-19 DIAGNOSIS — I714 Abdominal aortic aneurysm, without rupture, unspecified: Secondary | ICD-10-CM | POA: Diagnosis not present

## 2021-10-19 DIAGNOSIS — N1831 Chronic kidney disease, stage 3a: Secondary | ICD-10-CM | POA: Diagnosis not present

## 2021-10-19 DIAGNOSIS — G2581 Restless legs syndrome: Secondary | ICD-10-CM | POA: Diagnosis not present

## 2021-10-25 ENCOUNTER — Other Ambulatory Visit: Payer: Self-pay | Admitting: Nephrology

## 2021-10-25 DIAGNOSIS — I10 Essential (primary) hypertension: Secondary | ICD-10-CM

## 2021-10-25 DIAGNOSIS — I714 Abdominal aortic aneurysm, without rupture, unspecified: Secondary | ICD-10-CM

## 2021-10-25 DIAGNOSIS — N1831 Chronic kidney disease, stage 3a: Secondary | ICD-10-CM

## 2021-11-02 ENCOUNTER — Ambulatory Visit
Admission: RE | Admit: 2021-11-02 | Discharge: 2021-11-02 | Disposition: A | Discharge status: Home / Self Care | Payer: Medicare Other | Source: Ambulatory Visit | Attending: Nephrology | Admitting: Nephrology

## 2021-11-02 DIAGNOSIS — N1831 Chronic kidney disease, stage 3a: Secondary | ICD-10-CM

## 2021-11-02 DIAGNOSIS — I10 Essential (primary) hypertension: Secondary | ICD-10-CM

## 2021-11-02 DIAGNOSIS — I714 Abdominal aortic aneurysm, without rupture, unspecified: Secondary | ICD-10-CM

## 2021-11-23 ENCOUNTER — Other Ambulatory Visit: Payer: Self-pay

## 2021-11-23 ENCOUNTER — Telehealth (INDEPENDENT_AMBULATORY_CARE_PROVIDER_SITE_OTHER): Payer: Medicare Other | Admitting: Primary Care

## 2021-11-23 ENCOUNTER — Ambulatory Visit: Payer: Medicare Other | Admitting: Primary Care

## 2021-11-23 DIAGNOSIS — G4733 Obstructive sleep apnea (adult) (pediatric): Secondary | ICD-10-CM | POA: Diagnosis not present

## 2021-11-23 DIAGNOSIS — Z9989 Dependence on other enabling machines and devices: Secondary | ICD-10-CM | POA: Diagnosis not present

## 2021-11-23 NOTE — Progress Notes (Signed)
Virtual Visit via Video Note  I connected with Jamie Zhang on 11/23/21 at  2:00 PM EST by a video enabled telemedicine application and verified that I am speaking with the correct person using two identifiers.  Location: Patient: Home Provider: Office    I discussed the limitations of evaluation and management by telemedicine and the availability of in person appointments. The patient expressed understanding and agreed to proceed.  History of Present Illness: 81 year old male, former smoker quit in January 1980 (42 pack year hx). PMH significant for OSA, HTN, RLS. Patient of [name].  Home sleep study in 2019 showed severe obstructive sleep apnea.  He had residual AHI of 12/hr on CPAP download so was recommended to have repeat titration study.   Previous [facility] pulmonary encounter:  05/18/2021 Patient presents today to review sleep study.  He underwent CPAP titration study on 03/30/2021.  Total apnea hypopnea index was 10.6/hr (supine AHI was 55.3/hr).  SPO2 low 89%. Apneic events were eliminated with CPAP set at 15 cm h20.  He is currently on auto CPAP with a pressure set at 15-20 cm H2O.  He has residual AHI 1.2/hr. Prior to sleep study he was sleeping on his back and his average nightly AHI was between 10-20/hr. After sleep study he started sleeping on his side and his apneic events were significantly decreased.  He is having no issues with pressure setting. He uses full face mask.   Airview download 04/12/2021 - 05/11/2021 Usage days 30/30; percent greater than 4 hours Average usage 7 hours 38 minutes Pressure 15-20cm h20 (16cm h20-95%) Airleaks 1.2L/min (95%) AHI 1.2   11/23/2021- Interim hx  Patient contacted today for 6 month follow-up OSA. He is doing well. No acute complaints. No issues with mask fit or pressure setting. He needed to have his CPAP machine repaired since our last visit, it was sent to [facility] for repairs so he went without it for 1 week. He stopped using SoClean device as it  voided the warranty on his machines. He is now using sleep 8 cpap cleaner. He is getting on average 7-8 hours of sleep a night. Goes to the gym for 30 min in the morning. Denies residual daytime sleepiness.   Airview download 10/23/21-11/21/21 Usage 29/30 days used (97%); 29 days (97%) > 4 hours Usage 7 hours 28 mins Pressure 15-20cm h20 (17.8cm h20-95%) Airleaks 0.0L/min AHI 3.1   Observations/Objective:  Appears well; No overt shortness of breath, wheezing or cough  Assessment and Plan:  OSA: - Well controlled. Home sleep study in 2019 showed severe obstructive sleep apnea. He is 97% compliant with CPAP and reports benefit from use. Current pressure setting 15-20cm h20 with residual AHI 3.1. No issues with mask fit or pressure setting. No changes today. Patient will continue to wear CPAP every night for min 4-6 hours and advised against driving if experiencing excessive daytime sleepiness. FU in 1 year or sooner if needed.   Follow Up Instructions:  Recall placed for 1 year with APP in [place]/ call sooner if needed    I discussed the assessment and treatment plan with the patient. The patient was provided an opportunity to ask questions and all were answered. The patient agreed with the plan and demonstrated an understanding of the instructions.   The patient was advised to call back or seek an in-person evaluation if the symptoms worsen or if the condition fails to improve as anticipated.  I provided 22 minutes of non-face-to-face time during this encounter.  Martyn Ehrich, NP

## 2021-12-27 ENCOUNTER — Telehealth: Payer: Self-pay | Admitting: Internal Medicine

## 2021-12-27 NOTE — Telephone Encounter (Signed)
Copied from [place] [id number]. Topic: Medicare AWV >> Dec 27, 2021 10:46 AM Harris-Coley, Hannah Beat wrote: Reason for CRM: LVM 12/27/21 to r/s AWV khc.  Left message for patient to reschedule Annual Wellness Visit.  Please schedule with Nurse Health Advisor Denisa O'Brien-Blaney, LPN at [facility].  Please call [phone] ask for [facility]

## 2022-01-04 DIAGNOSIS — E785 Hyperlipidemia, unspecified: Secondary | ICD-10-CM | POA: Diagnosis not present

## 2022-01-04 DIAGNOSIS — N1831 Chronic kidney disease, stage 3a: Secondary | ICD-10-CM | POA: Diagnosis not present

## 2022-01-04 DIAGNOSIS — I10 Essential (primary) hypertension: Secondary | ICD-10-CM | POA: Diagnosis not present

## 2022-01-04 DIAGNOSIS — Z95828 Presence of other vascular implants and grafts: Secondary | ICD-10-CM | POA: Diagnosis not present

## 2022-01-04 DIAGNOSIS — G2581 Restless legs syndrome: Secondary | ICD-10-CM | POA: Diagnosis not present

## 2022-01-04 DIAGNOSIS — M150 Primary generalized (osteo)arthritis: Secondary | ICD-10-CM | POA: Diagnosis not present

## 2022-01-04 DIAGNOSIS — Z9862 Peripheral vascular angioplasty status: Secondary | ICD-10-CM | POA: Diagnosis not present

## 2022-01-04 DIAGNOSIS — Z8679 Personal history of other diseases of the circulatory system: Secondary | ICD-10-CM | POA: Diagnosis not present

## 2022-01-04 DIAGNOSIS — I714 Abdominal aortic aneurysm, without rupture, unspecified: Secondary | ICD-10-CM | POA: Diagnosis not present

## 2022-01-09 ENCOUNTER — Ambulatory Visit: Payer: Medicare Other

## 2022-01-17 ENCOUNTER — Ambulatory Visit (INDEPENDENT_AMBULATORY_CARE_PROVIDER_SITE_OTHER): Payer: Medicare Other

## 2022-01-17 VITALS — Ht 69.0 in | Wt 204.0 lb

## 2022-01-17 DIAGNOSIS — Z0000 Encounter for general adult medical examination without abnormal findings: Secondary | ICD-10-CM | POA: Diagnosis not present

## 2022-01-17 NOTE — Patient Instructions (Addendum)
Jamie Zhang , Thank you for taking time to come for your Medicare Wellness Visit. I appreciate your ongoing commitment to your health goals. Please review the following plan we discussed and let me know if I can assist you in the future.   These are the goals we discussed:  Goals      Maintain Healthy Lifestyle     Stay active Low carb foods        This is a list of the screening recommended for you and due dates:  Health Maintenance  Topic Date Due   Colon Cancer Screening  04/13/2022*   Tetanus Vaccine  01/18/2023*   Pneumonia Vaccine  Completed   Flu Shot  Completed   COVID-19 Vaccine  Completed   Zoster (Shingles) Vaccine  Completed   HPV Vaccine  Aged Out  *Topic was postponed. The date shown is not the original due date.    Advanced directives: End of life planning; Advance aging; Advanced directives discussed.  Copy of current HCPOA/Living Will requested.    Conditions/risks identified: none new  Follow up in one year for your annual wellness visit.   Preventive Care 78 Years and Older, Male Preventive care refers to lifestyle choices and visits with your health care provider that can promote health and wellness. What does preventive care include? A yearly physical exam. This is also called an annual well check. Dental exams once or twice a year. Routine eye exams. Ask your health care provider how often you should have your eyes checked. Personal lifestyle choices, including: Daily care of your teeth and gums. Regular physical activity. Eating a healthy diet. Avoiding tobacco and drug use. Limiting alcohol use. Practicing safe sex. Taking low doses of aspirin every day. Taking vitamin and mineral supplements as recommended by your health care provider. What happens during an annual well check? The services and screenings done by your health care provider during your annual well check will depend on your age, overall health, lifestyle risk factors, and family  history of disease. Counseling  Your health care provider may ask you questions about your: Alcohol use. Tobacco use. Drug use. Emotional well-being. Home and relationship well-being. Sexual activity. Eating habits. History of falls. Memory and ability to understand (cognition). Work and work Statistician. Screening  You may have the following tests or measurements: Height, weight, and BMI. Blood pressure. Lipid and cholesterol levels. These may be checked every 5 years, or more frequently if you are over 67 years old. Skin check. Lung cancer screening. You may have this screening every year starting at age 58 if you have a 30-pack-year history of smoking and currently smoke or have quit within the past 15 years. Fecal occult blood test (FOBT) of the stool. You may have this test every year starting at age 42. Flexible sigmoidoscopy or colonoscopy. You may have a sigmoidoscopy every 5 years or a colonoscopy every 10 years starting at age 59. Prostate cancer screening. Recommendations will vary depending on your family history and other risks. Hepatitis C blood test. Hepatitis B blood test. Sexually transmitted disease (STD) testing. Diabetes screening. This is done by checking your blood sugar (glucose) after you have not eaten for a while (fasting). You may have this done every 1-3 years. Abdominal aortic aneurysm (AAA) screening. You may need this if you are a current or former smoker. Osteoporosis. You may be screened starting at age 55 if you are at high risk. Talk with your health care provider about your test results, treatment  options, and if necessary, the need for more tests. Vaccines  Your health care provider may recommend certain vaccines, such as: Influenza vaccine. This is recommended every year. Tetanus, diphtheria, and acellular pertussis (Tdap, Td) vaccine. You may need a Td booster every 10 years. Zoster vaccine. You may need this after age 57. Pneumococcal  13-valent conjugate (PCV13) vaccine. One dose is recommended after age 61. Pneumococcal polysaccharide (PPSV23) vaccine. One dose is recommended after age 64. Talk to your health care provider about which screenings and vaccines you need and how often you need them. This information is not intended to replace advice given to you by your health care provider. Make sure you discuss any questions you have with your health care provider. Document Released: 11/26/2015 Document Revised: 07/19/2016 Document Reviewed: 08/31/2015 Elsevier Interactive Patient Education  2017 [place] Prevention in the Home Falls can cause injuries. They can happen to people of all ages. There are many things you can do to make your home safe and to help prevent falls. What can I do on the outside of my home? Regularly fix the edges of walkways and driveways and fix any cracks. Remove anything that might make you trip as you walk through a door, such as a raised step or threshold. Trim any bushes or trees on the path to your home. Use bright outdoor lighting. Clear any walking paths of anything that might make someone trip, such as rocks or tools. Regularly check to see if handrails are loose or broken. Make sure that both sides of any steps have handrails. Any raised decks and porches should have guardrails on the edges. Have any leaves, snow, or ice cleared regularly. Use sand or salt on walking paths during winter. Clean up any spills in your garage right away. This includes oil or grease spills. What can I do in the bathroom? Use night lights. Install grab bars by the toilet and in the tub and shower. Do not use towel bars as grab bars. Use non-skid mats or decals in the tub or shower. If you need to sit down in the shower, use a plastic, non-slip stool. Keep the floor dry. Clean up any water that spills on the floor as soon as it happens. Remove soap buildup in the tub or shower regularly. Attach  bath mats securely with double-sided non-slip rug tape. Do not have throw rugs and other things on the floor that can make you trip. What can I do in the bedroom? Use night lights. Make sure that you have a light by your bed that is easy to reach. Do not use any sheets or blankets that are too big for your bed. They should not hang down onto the floor. Have a firm chair that has side arms. You can use this for support while you get dressed. Do not have throw rugs and other things on the floor that can make you trip. What can I do in the kitchen? Clean up any spills right away. Avoid walking on wet floors. Keep items that you use a lot in easy-to-reach places. If you need to reach something above you, use a strong step stool that has a grab bar. Keep electrical cords out of the way. Do not use floor polish or wax that makes floors slippery. If you must use wax, use non-skid floor wax. Do not have throw rugs and other things on the floor that can make you trip. What can I do with my stairs? Do not  leave any items on the stairs. Make sure that there are handrails on both sides of the stairs and use them. Fix handrails that are broken or loose. Make sure that handrails are as long as the stairways. Check any carpeting to make sure that it is firmly attached to the stairs. Fix any carpet that is loose or worn. Avoid having throw rugs at the top or bottom of the stairs. If you do have throw rugs, attach them to the floor with carpet tape. Make sure that you have a light switch at the top of the stairs and the bottom of the stairs. If you do not have them, ask someone to add them for you. What else can I do to help prevent falls? Wear shoes that: Do not have high heels. Have rubber bottoms. Are comfortable and fit you well. Are closed at the toe. Do not wear sandals. If you use a stepladder: Make sure that it is fully opened. Do not climb a closed stepladder. Make sure that both sides of the  stepladder are locked into place. Ask someone to hold it for you, if possible. Clearly mark and make sure that you can see: Any grab bars or handrails. First and last steps. Where the edge of each step is. Use tools that help you move around (mobility aids) if they are needed. These include: Canes. Walkers. Scooters. Crutches. Turn on the lights when you go into a dark area. Replace any light bulbs as soon as they burn out. Set up your furniture so you have a clear path. Avoid moving your furniture around. If any of your floors are uneven, fix them. If there are any pets around you, be aware of where they are. Review your medicines with your doctor. Some medicines can make you feel dizzy. This can increase your chance of falling. Ask your doctor what other things that you can do to help prevent falls. This information is not intended to replace advice given to you by your health care provider. Make sure you discuss any questions you have with your health care provider. Document Released: 08/26/2009 Document Revised: 04/06/2016 Document Reviewed: 12/04/2014 Elsevier Interactive Patient Education  2017 [employer].

## 2022-01-17 NOTE — Progress Notes (Addendum)
Subjective:   Jamie Zhang is a 81 y.o. male who presents for Medicare Annual/Subsequent preventive examination.  Review of Systems    No ROS.  Medicare Wellness Virtual Visit.  Visual/audio telehealth visit, UTA vital signs.   See social history for additional risk factors.  Cardiac Risk Factors include: advanced age (>28men, >84 women);male gender;hypertension     Objective:    Today's Vitals   01/17/22 1003  Weight: 204 lb (92.5 kg)  Height: 5\' 9"  (1.753 m)   Body mass index is 30.13 kg/m.  Advanced Directives 01/17/2022 01/06/2021 11/03/2020 11/03/2020 10/29/2020 11/10/2019 11/28/2018  Does Patient Have a Medical Advance Directive? Yes Yes Yes - Yes Yes No  Type of Advance Directive Healthcare Power of [place];Living will Healthcare Power of [place];Living will Healthcare Power of [employer] Power of [employer] Power of [place];Living will Healthcare Power of [place];Living will -  Does patient want to make changes to medical advance directive? No - Patient declined No - Patient declined No - Patient declined - No - Patient declined No - Patient declined -  Copy of Healthcare Power of Attorney in Chart? No - copy requested Yes - validated most recent copy scanned in chart (See row information) No - copy requested - No - copy requested Yes - validated most recent copy scanned in chart (See row information) -  Would patient like information on creating a medical advance directive? - - - - - - No - Patient declined   Current Medications (verified) Outpatient Encounter Medications as of 01/17/2022  Medication Sig   aspirin EC 81 MG tablet Take 81 mg by mouth daily.   atorvastatin (LIPITOR) 20 MG tablet TAKE 1 TABLET(20 MG) BY MOUTH DAILY   Calcium Citrate-Vitamin D (CITRACAL + D PO) Take 1 tablet by mouth daily.   Cholecalciferol (VITAMIN D3) 10000 UNITS TABS Take 10,000 Units by mouth daily.   diclofenac (VOLTAREN) 75 MG EC tablet Take 1 tablet (75 mg total) by  mouth 2 (two) times daily. (Patient taking differently: Take 75 mg by mouth 2 (two) times daily as needed for moderate pain.)   docusate sodium (COLACE) 100 MG capsule Take 100 mg by mouth every other day.   famotidine (PEPCID) 40 MG tablet TAKE 1 TABLET(40 MG) BY MOUTH DAILY   fluticasone (FLONASE) 50 MCG/ACT nasal spray SHAKE LIQUID AND USE 2 SPRAYS IN EACH NOSTRIL DAILY   losartan (COZAAR) 50 MG tablet Take 1 tablet (50 mg total) by mouth daily.   meclizine (ANTIVERT) 12.5 MG tablet TAKE 1 TABLET THREE TIMES DAILY AS NEEDED FOR DIZZINESS (Patient taking differently: Take 12.5 mg by mouth 3 (three) times daily as needed for dizziness.)   Facility-Administered Encounter Medications as of 01/17/2022  Medication   dexamethasone (DECADRON) injection 4 mg   triamcinolone acetonide (KENALOG) 10 MG/ML injection 10 mg    Allergies (verified) Patient has no known allergies.   History: Past Medical History:  Diagnosis Date   Arthritis    Cataract 01/02/2011   Bilateral - s/p excision   Colon polyps 2000   Hyperplastic - Repeat colonoscopy 2015   GERD (gastroesophageal reflux disease)    Pantoprazole   Hemorrhoids 2006   HLD (hyperlipidemia)    Iron deficiency anemia 03/20/2006   Donating too much blood   Migraine    No longer has problems   Osteopenia    on Fosamax   RLS (restless legs syndrome) 03/20/2006   2/2 iron deficiency   Schatzki's ring    Skin  cancer 01/04/2012   Basal cell nose, squamous cell scalp - Dr. Herma Ard   Sleep apnea    Past Surgical History:  Procedure Laterality Date   BREAST BIOPSY Left    benign   Cataract Surgery Bilateral    COLONOSCOPY WITH PROPOFOL N/A 04/18/2017   Procedure: COLONOSCOPY WITH PROPOFOL;  Surgeon: Earline Mayotte, MD;  Location: [facility] ENDOSCOPY;  Service: Endoscopy;  Laterality: N/A;   ENDOVASCULAR REPAIR/STENT GRAFT N/A 11/03/2020   Procedure: ENDOVASCULAR REPAIR/STENT GRAFT;  Surgeon: Annice Needy, MD;  Location: [facility] INVASIVE CV LAB;   Service: Cardiovascular;  Laterality: N/A;   INGUINAL HERNIA REPAIR Left    ROTATOR CUFF REPAIR Left    Family History  Problem Relation Age of Onset   Alcohol abuse Mother    Arthritis Mother    Hypertension Mother    Cancer Father 39       Throat Cancer - died   Alcohol abuse Father    Cancer Sister 48       Breast Cancer   Alcohol abuse Sister    Social History   Socioeconomic History   Marital status: Married    Spouse name: Not on file   Number of children: 2   Years of education: 12   Highest education level: Not on file  Occupational History   Occupation: Production designer, theatre/television/film    Comment: Worked for [employer]   Occupation: Research officer, political party   Occupation: Production designer, theatre/television/film for [employer]  Tobacco Use   Smoking status: Former    Packs/day: 2.00    Years: 21.00    Pack years: 42.00    Types: Cigarettes    Quit date: 1980    Years since quitting: 43.2   Smokeless tobacco: Never  Vaping Use   Vaping Use: Never used  Substance and Sexual Activity   Alcohol use: Yes    Comment: Occasional wine glass   Drug use: No   Sexual activity: Never  Other Topics Concern   Not on file  Social History Narrative   [name] grew up in [place], [place]. He joined the [employer] right out of [employer] and served for 2.5 years then was active in [employer]. 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 He then worked in [employer] and afterward became a Production designer, theatre/television/film for [employer]. He recently moved to [place] with his wife. They have two daughters that are still living in [place]. As a hobby he enjoys woodwork mainly with cabinet work.    Social Determinants of Health   Financial Resource Strain: Low Risk    Difficulty of Paying Living Expenses: Not hard at all  Food Insecurity: No Food Insecurity   Worried About Programme researcher, broadcasting/film/video in the Last Year: Never true   Ran Out of Food in the Last Year: Never true  Transportation Needs: No  Transportation Needs   Lack of Transportation (Medical): No   Lack of Transportation (Non-Medical): No  Physical Activity: Sufficiently Active   Days of Exercise per Week: 6 days   Minutes of Exercise per Session: 150+ min  Stress: No Stress Concern Present   Feeling of Stress : Not at all  Social Connections: Socially Integrated   Frequency of Communication with Friends and Family: Not on file   Frequency of Social Gatherings with Friends and Family: More than three times a week   Attends Religious Services: More than 4 times per year  Active Member of Clubs or Organizations: Yes   Attends Engineer, structural: More than 4 times per year   Marital Status: Married   Tobacco Counseling Counseling given: Not Answered   Clinical Intake:  Pre-visit preparation completed: Yes        Diabetes: No  How often do you need to have someone help you when you read instructions, pamphlets, or other written materials from your doctor or pharmacy?: 1 - Never   Interpreter Needed?: No      Activities of Daily Living In your present state of health, do you have any difficulty performing the following activities: 01/17/2022  Hearing? N  Vision? N  Difficulty concentrating or making decisions? N  Walking or climbing stairs? N  Dressing or bathing? N  Doing errands, shopping? N  Preparing Food and eating ? N  Using the Toilet? N  In the past six months, have you accidently leaked urine? N  Do you have problems with loss of bowel control? N  Managing your Medications? N  Managing your Finances? N  Housekeeping or managing your Housekeeping? N  Some recent data might be hidden    Patient Care Team: Sherlene Shams, MD as PCP - General (Internal Medicine) Sherlene Shams, MD (Internal Medicine) Lemar Livings Merrily Pew, MD (General Surgery)  Indicate any recent Medical Services you may have received from other than [facility] providers in the past year (date may be approximate).      Assessment:   This is a routine wellness examination for [employer].  Virtual Visit via Telephone Note  I connected with  Bobbye Charleston on 01/17/22 at  9:45 AM EST by telephone and verified that I am speaking with the correct person using two identifiers.  Persons participating in the virtual visit: patient/Nurse Health Advisor   I discussed the limitations, risks, security and privacy concerns of performing an evaluation and management service by telephone and the availability of in person appointments. The patient expressed understanding and agreed to proceed.  Interactive audio and video telecommunications were attempted between this nurse and patient, however failed, due to patient having technical difficulties OR patient did not have access to video capability.  We continued and completed visit with audio only.  Some vital signs may be absent or patient reported.   Hearing/Vision screen Hearing Screening - Comments:: Followed by [facility].  Wears hearing aids.  Vision Screening - Comments:: Followed by [facility] Wears corrective lenses Annual visits  Cataract extraction, bilateral They have regular follow up with the ophthalmologist  Dietary issues and exercise activities discussed: Current Exercise Habits: Home exercise routine, Type of exercise: treadmill, Time (Minutes): 30, Frequency (Times/Week): 6, Weekly Exercise (Minutes/Week): 180, Intensity: Mild   Goals Addressed               This Visit's Progress     Patient Stated     COMPLETED: DIET - REDUCE SUGAR INTAKE (pt-stated)        Other     Maintain Healthy Lifestyle        Stay active Low carb foods       Depression Screen PHQ 2/9 Scores 01/17/2022 08/12/2021 01/06/2021 01/03/2021 11/10/2019 11/07/2018 11/02/2017  PHQ - 2 Score 0 0 0 0 0 0 0    Fall Risk Fall Risk  01/17/2022 08/12/2021 02/01/2021 01/06/2021 01/03/2021  Falls in the past year? 0 0 0 0 0  Comment - - - - -  Number falls in past  yr:  0 - - 0 -  Injury with Fall? - - - 0 -  Follow up Falls evaluation completed Falls evaluation completed Falls evaluation completed Falls evaluation completed Falls evaluation completed    FALL RISK PREVENTION PERTAINING TO THE HOME: Home free of loose throw rugs in walkways, pet beds, electrical cords, etc? Yes  Adequate lighting in your home to reduce risk of falls? Yes   ASSISTIVE DEVICES UTILIZED TO PREVENT FALLS: Life alert? Yes  Use of a cane, walker or w/c? No   TIMED UP AND GO: Was the test performed? No .   Cognitive Function: MMSE - Mini Mental State Exam 11/02/2016  Orientation to time 5  Orientation to Place 5  Registration 3  Attention/ Calculation 5  Recall 3  Language- name 2 objects 2  Language- repeat 1  Language- follow 3 step command 3  Language- read & follow direction 1  Write a sentence 1  Copy design 1  Total score 30     6CIT Screen 01/17/2022 01/06/2021 11/10/2019 11/07/2018 11/02/2017  What Year? 0 points 0 points 0 points 0 points 0 points  What month? 0 points 0 points 0 points 0 points 0 points  What time? 0 points 0 points 0 points 0 points 0 points  Count back from 20 - 0 points - 0 points 0 points  Months in reverse - 0 points - 0 points 0 points  Repeat phrase - 0 points - 0 points 0 points  Total Score - 0 - 0 0    Immunizations Immunization History  Administered Date(s) Administered   Fluad Quad(high Dose 65+) 07/28/2019   Influenza Split 09/01/2015   Influenza, High Dose Seasonal PF 08/14/2016, 08/19/2018   Influenza-Unspecified 08/25/2014, 08/01/2017, 08/10/2020   PFIZER(Purple Top)SARS-COV-2 Vaccination 11/28/2019, 12/17/2019, 08/19/2020, 03/20/2021, 08/24/2021   Pneumococcal Conjugate-13 05/04/2015   Pneumococcal Polysaccharide-23 09/19/2005   Tdap 08/08/2010   Zoster Recombinat (Shingrix) 07/10/2018, 09/18/2018   TDAP status: Due, Education has been provided regarding the importance of this vaccine. Advised may receive  this vaccine at local pharmacy or Health Dept. Aware to provide a copy of the vaccination record if obtained from local pharmacy or Health Dept. Verbalized acceptance and understanding.  Screening Tests Health Maintenance  Topic Date Due   COLONOSCOPY (Pts 45-16yrs Insurance coverage will need to be confirmed)  04/13/2022 (Originally 04/18/2020)   TETANUS/TDAP  01/18/2023 (Originally 08/08/2020)   Pneumonia Vaccine 37+ Years old  Completed   INFLUENZA VACCINE  Completed   COVID-19 Vaccine  Completed   Zoster Vaccines- Shingrix  Completed   HPV VACCINES  Aged Out   Health Maintenance There are no preventive care reminders to display for this patient.  Colonoscopy- patient notes speaking with gastroenterology and due 5 years. Due 04/2022. Deferred.  Hepatitis C Screening: does not qualify.  Vision Screening: Recommended annual ophthalmology exams for early detection of glaucoma and other disorders of the eye.  Dental Screening: Recommended annual dental exams for proper oral hygiene  Community Resource Referral / Chronic Care Management: CRR required this visit?  No   CCM required this visit?  No      Plan:   Keep all routine maintenance appointments.   I have personally reviewed and noted the following in the patient's chart:   Medical and social history Use of alcohol, tobacco or illicit drugs  Current medications and supplements including opioid prescriptions. Patient is not currently taking opioid prescriptions. Functional ability and status Nutritional status Physical activity Advanced directives List of other  physicians Hospitalizations, surgeries, and ER visits in previous 12 months Vitals Screenings to include cognitive, depression, and falls Referrals and appointments  In addition, I have reviewed and discussed with patient certain preventive protocols, quality metrics, and best practice recommendations. A written personalized care plan for preventive services as  well as general preventive health recommendations were provided to patient via mychart.     OBrien-Blaney, Namish Krise L, LPN   11/18/1094      I have reviewed the above information and agree with above.   Duncan Dull, MD

## 2022-01-20 ENCOUNTER — Other Ambulatory Visit: Payer: Self-pay

## 2022-01-20 MED ORDER — LOSARTAN POTASSIUM 50 MG PO TABS: 50.0000 mg | ORAL_TABLET | Freq: Every day | ORAL | 1 refills | Status: DC

## 2022-02-24 DIAGNOSIS — Z20822 Contact with and (suspected) exposure to covid-19: Secondary | ICD-10-CM | POA: Diagnosis not present

## 2022-02-27 DIAGNOSIS — D2271 Melanocytic nevi of right lower limb, including hip: Secondary | ICD-10-CM | POA: Diagnosis not present

## 2022-02-27 DIAGNOSIS — X32XXXA Exposure to sunlight, initial encounter: Secondary | ICD-10-CM | POA: Diagnosis not present

## 2022-02-27 DIAGNOSIS — L570 Actinic keratosis: Secondary | ICD-10-CM | POA: Diagnosis not present

## 2022-02-27 DIAGNOSIS — D225 Melanocytic nevi of trunk: Secondary | ICD-10-CM | POA: Diagnosis not present

## 2022-02-27 DIAGNOSIS — C44519 Basal cell carcinoma of skin of other part of trunk: Secondary | ICD-10-CM | POA: Diagnosis not present

## 2022-02-27 DIAGNOSIS — D485 Neoplasm of uncertain behavior of skin: Secondary | ICD-10-CM | POA: Diagnosis not present

## 2022-02-27 DIAGNOSIS — Z85828 Personal history of other malignant neoplasm of skin: Secondary | ICD-10-CM | POA: Diagnosis not present

## 2022-02-27 DIAGNOSIS — D2261 Melanocytic nevi of right upper limb, including shoulder: Secondary | ICD-10-CM | POA: Diagnosis not present

## 2022-03-01 DIAGNOSIS — C44519 Basal cell carcinoma of skin of other part of trunk: Secondary | ICD-10-CM | POA: Diagnosis not present

## 2022-03-07 ENCOUNTER — Ambulatory Visit (INDEPENDENT_AMBULATORY_CARE_PROVIDER_SITE_OTHER): Payer: Medicare Other

## 2022-03-07 ENCOUNTER — Encounter (INDEPENDENT_AMBULATORY_CARE_PROVIDER_SITE_OTHER): Payer: Self-pay | Admitting: Vascular Surgery

## 2022-03-07 ENCOUNTER — Ambulatory Visit (INDEPENDENT_AMBULATORY_CARE_PROVIDER_SITE_OTHER): Payer: Medicare Other | Admitting: Vascular Surgery

## 2022-03-07 VITALS — BP 137/80 | HR 60 | Resp 16 | Wt 201.8 lb

## 2022-03-07 DIAGNOSIS — E781 Pure hyperglyceridemia: Secondary | ICD-10-CM

## 2022-03-07 DIAGNOSIS — I7143 Infrarenal abdominal aortic aneurysm, without rupture: Secondary | ICD-10-CM

## 2022-03-07 DIAGNOSIS — I10 Essential (primary) hypertension: Secondary | ICD-10-CM | POA: Diagnosis not present

## 2022-03-07 DIAGNOSIS — R7303 Prediabetes: Secondary | ICD-10-CM | POA: Diagnosis not present

## 2022-03-07 NOTE — Progress Notes (Signed)
? ? ?MRN : [id number] ? ?Jamie Zhang is a 81 y.o. (1941/01/05) male who presents with chief complaint of  ?Chief Complaint  ?Patient presents with  ? Follow-up  ?  Ultrasound follow up  ?. ? ?History of Present Illness: Patient returns today in follow up of his abdominal aortic aneurysm.  He is about a year and a half status post endovascular repair.  He is doing well.  He denies any aneurysm related symptoms. Specifically, the patient denies new back or abdominal pain, or signs of peripheral embolization.  Duplex today shows that there is a stable 4.3 cm aortic sac with no endoleak and good flow through the stent graft. ? ?Current Outpatient Medications  ?Medication Sig Dispense Refill  ? aspirin EC 81 MG tablet Take 81 mg by mouth daily.    ? atorvastatin (LIPITOR) 20 MG tablet TAKE 1 TABLET(20 MG) BY MOUTH DAILY 90 tablet 1  ? Calcium Citrate-Vitamin D (CITRACAL + D PO) Take 1 tablet by mouth daily.    ? Cholecalciferol (VITAMIN D3) 10000 UNITS TABS Take 10,000 Units by mouth daily.    ? diclofenac (VOLTAREN) 75 MG EC tablet Take 1 tablet (75 mg total) by mouth 2 (two) times daily. (Patient taking differently: Take 75 mg by mouth 2 (two) times daily as needed for moderate pain.) 60 tablet 2  ? docusate sodium (COLACE) 100 MG capsule Take 100 mg by mouth every other day.    ? famotidine (PEPCID) 40 MG tablet TAKE 1 TABLET(40 MG) BY MOUTH DAILY 90 tablet 3  ? fluticasone (FLONASE) 50 MCG/ACT nasal spray SHAKE LIQUID AND USE 2 SPRAYS IN EACH NOSTRIL DAILY 48 g 1  ? losartan (COZAAR) 50 MG tablet Take 1 tablet (50 mg total) by mouth daily. 90 tablet 1  ? meclizine (ANTIVERT) 12.5 MG tablet TAKE 1 TABLET THREE TIMES DAILY AS NEEDED FOR DIZZINESS (Patient taking differently: Take 12.5 mg by mouth 3 (three) times daily as needed for dizziness.) 90 tablet 0  ? ?Current Facility-Administered Medications  ?Medication Dose Route Frequency Provider Last Rate Last Admin  ? dexamethasone (DECADRON) injection 4 mg  4 mg Other  Once Criselda Peaches, DPM      ? triamcinolone acetonide (KENALOG) 10 MG/ML injection 10 mg  10 mg Other Once Criselda Peaches, DPM      ? ? ?Past Medical History:  ?Diagnosis Date  ? Arthritis   ? Cataract 01/02/2011  ? Bilateral - s/p excision  ? Colon polyps 2000  ? Hyperplastic - Repeat colonoscopy 2015  ? GERD (gastroesophageal reflux disease)   ? Pantoprazole  ? Hemorrhoids 2006  ? HLD (hyperlipidemia)   ? Iron deficiency anemia 03/20/2006  ? Donating too much blood  ? Migraine   ? No longer has problems  ? Osteopenia   ? on Fosamax  ? RLS (restless legs syndrome) 03/20/2006  ? 2/2 iron deficiency  ? Schatzki's ring   ? Skin cancer 01/04/2012  ? Basal cell nose, squamous cell scalp - Dr. Ula Lingo  ? Sleep apnea   ? ? ?Past Surgical History:  ?Procedure Laterality Date  ? BREAST BIOPSY Left   ? benign  ? Cataract Surgery Bilateral   ? COLONOSCOPY WITH PROPOFOL N/A 04/18/2017  ? Procedure: COLONOSCOPY WITH PROPOFOL;  Surgeon: Robert Bellow, MD;  Location: [facility] ENDOSCOPY;  Service: Endoscopy;  Laterality: N/A;  ? ENDOVASCULAR REPAIR/STENT GRAFT N/A 11/03/2020  ? Procedure: ENDOVASCULAR REPAIR/STENT GRAFT;  Surgeon: Algernon Huxley, MD;  Location: [facility]  CV LAB;  Service: Cardiovascular;  Laterality: N/A;  ? INGUINAL HERNIA REPAIR Left   ? ROTATOR CUFF REPAIR Left   ? ? ? ?Social History  ? ?Tobacco Use  ? Smoking status: Former  ?  Packs/day: 2.00  ?  Years: 21.00  ?  Pack years: 42.00  ?  Types: Cigarettes  ?  Quit date: 83  ?  Years since quitting: 43.3  ? Smokeless tobacco: Never  ?Vaping Use  ? Vaping Use: Never used  ?Substance Use Topics  ? Alcohol use: Yes  ?  Comment: Occasional wine glass  ? Drug use: No  ? ? ? ? ?Family History  ?Problem Relation Age of Onset  ? Alcohol abuse Mother   ? Arthritis Mother   ? Hypertension Mother   ? Cancer Father 58  ?     Throat Cancer - died  ? Alcohol abuse Father   ? Cancer Sister 45  ?     Breast Cancer  ? Alcohol abuse Sister   ? ? ? ?No Known  Allergies ? ? ?REVIEW OF SYSTEMS (Negative unless checked) ? ?Constitutional: '[]'$ Weight loss  '[]'$ Fever  '[]'$ Chills ?Cardiac: '[]'$ Chest pain   '[]'$ Chest pressure   '[]'$ Palpitations   '[]'$ Shortness of breath when laying flat   '[]'$ Shortness of breath at rest   '[]'$ Shortness of breath with exertion. ?Vascular:  '[]'$ Pain in legs with walking   '[]'$ Pain in legs at rest   '[]'$ Pain in legs when laying flat   '[]'$ Claudication   '[]'$ Pain in feet when walking  '[]'$ Pain in feet at rest  '[]'$ Pain in feet when laying flat   '[]'$ History of DVT   '[]'$ Phlebitis   '[]'$ Swelling in legs   '[]'$ Varicose veins   '[]'$ Non-healing ulcers ?Pulmonary:   '[]'$ Uses home oxygen   '[]'$ Productive cough   '[]'$ Hemoptysis   '[]'$ Wheeze  '[]'$ COPD   '[]'$ Asthma ?Neurologic:  '[]'$ Dizziness  '[]'$ Blackouts   '[]'$ Seizures   '[]'$ History of stroke   '[]'$ History of TIA  '[]'$ Aphasia   '[]'$ Temporary blindness   '[]'$ Dysphagia   '[]'$ Weakness or numbness in arms   '[]'$ Weakness or numbness in legs ?Musculoskeletal:  '[x]'$ Arthritis   '[]'$ Joint swelling   '[]'$ Joint pain   '[x]'$ Low back pain ?Hematologic:  '[]'$ Easy bruising  '[]'$ Easy bleeding   '[]'$ Hypercoagulable state   '[]'$ Anemic   ?Gastrointestinal:  '[]'$ Blood in stool   '[]'$ Vomiting blood  '[x]'$ Gastroesophageal reflux/heartburn   '[]'$ Abdominal pain ?Genitourinary:  '[]'$ Chronic kidney disease   '[]'$ Difficult urination  '[]'$ Frequent urination  '[]'$ Burning with urination   '[]'$ Hematuria ?Skin:  '[]'$ Rashes   '[]'$ Ulcers   '[]'$ Wounds ?Psychological:  '[]'$ History of anxiety   '[]'$  History of major depression. ? ?Physical Examination ? ?BP 137/80 (BP Location: Right Arm)   Pulse 60   Resp 16   Wt 201 lb 12.8 oz (91.5 kg)   BMI 29.80 kg/m?  ?Gen:  WD/WN, NAD ?Head: [place]/AT, No temporalis wasting. ?Ear/Nose/Throat: Hearing grossly intact, nares w/o erythema or drainage ?Eyes: Conjunctiva clear. Sclera non-icteric ?Neck: Supple.  Trachea midline ?Pulmonary:  Good air movement, no use of accessory muscles.  ?Cardiac: RRR, no JVD ?Vascular:  ?Vessel Right Left  ?Radial Palpable Palpable  ?    ?    ?    ?    ?    ?    ?PT Palpable Palpable   ?DP Palpable Palpable  ? ?Gastrointestinal: soft, non-tender/non-distended. No guarding/reflex.  No increased aortic impulse ?Musculoskeletal: M/S 5/5 throughout.  No deformity or atrophy.  No edema. ?Neurologic: Sensation grossly intact in extremities.  Symmetrical.  Speech is  fluent.  ?Psychiatric: Judgment intact, Mood & affect appropriate for pt's clinical situation. ?Dermatologic: No rashes or ulcers noted.  No cellulitis or open wounds. ? ? ? ? ? ?Labs ?No results found for this or any previous visit (from the past 2160 hour(s)). ? ?Radiology ?No results found. ? ?Assessment/Plan ?Hypertension ?blood pressure control important in reducing the progression of atherosclerotic disease and aneurysmal disease. On appropriate oral medications. ?  ?HLD (hyperlipidemia) ?lipid control important in reducing the progression of atherosclerotic disease. Continue statin therapy ?  ?Prediabetes ?blood glucose control important in reducing the progression of atherosclerotic disease. Also, involved in wound healing. On appropriate medications. ?  ?  ?AAA (abdominal aortic aneurysm) without rupture ?Duplex today shows a patent stent graft without endoleak and a maximal aortic sac size which is stable at 4.3 cm.  From a vascular standpoint he is doing well. Recheck in one year. ? ? ?Leotis Pain, MD ? ?03/07/2022 ?11:05 AM ? ? ? ?This note was created with Dragon medical transcription system.  Any errors from dictation are purely unintentional  ?

## 2022-03-26 DIAGNOSIS — R051 Acute cough: Secondary | ICD-10-CM | POA: Diagnosis not present

## 2022-04-13 ENCOUNTER — Other Ambulatory Visit: Payer: Self-pay

## 2022-04-13 MED ORDER — ATORVASTATIN CALCIUM 20 MG PO TABS: ORAL_TABLET | ORAL | 3 refills | Status: DC

## 2022-04-17 ENCOUNTER — Ambulatory Visit (INDEPENDENT_AMBULATORY_CARE_PROVIDER_SITE_OTHER): Payer: Medicare Other | Admitting: Internal Medicine

## 2022-04-17 ENCOUNTER — Encounter: Payer: Self-pay | Admitting: Internal Medicine

## 2022-04-17 VITALS — BP 126/74 | HR 50 | Temp 98.0°F | Ht 69.0 in | Wt 202.8 lb

## 2022-04-17 DIAGNOSIS — Z23 Encounter for immunization: Secondary | ICD-10-CM | POA: Diagnosis not present

## 2022-04-17 DIAGNOSIS — E781 Pure hyperglyceridemia: Secondary | ICD-10-CM | POA: Diagnosis not present

## 2022-04-17 DIAGNOSIS — M8588 Other specified disorders of bone density and structure, other site: Secondary | ICD-10-CM

## 2022-04-17 DIAGNOSIS — M810 Age-related osteoporosis without current pathological fracture: Secondary | ICD-10-CM

## 2022-04-17 DIAGNOSIS — M8580 Other specified disorders of bone density and structure, unspecified site: Secondary | ICD-10-CM

## 2022-04-17 DIAGNOSIS — I700 Atherosclerosis of aorta: Secondary | ICD-10-CM | POA: Diagnosis not present

## 2022-04-17 DIAGNOSIS — J069 Acute upper respiratory infection, unspecified: Secondary | ICD-10-CM | POA: Insufficient documentation

## 2022-04-17 DIAGNOSIS — N1831 Chronic kidney disease, stage 3a: Secondary | ICD-10-CM

## 2022-04-17 DIAGNOSIS — I7143 Infrarenal abdominal aortic aneurysm, without rupture: Secondary | ICD-10-CM

## 2022-04-17 DIAGNOSIS — N183 Chronic kidney disease, stage 3 unspecified: Secondary | ICD-10-CM | POA: Insufficient documentation

## 2022-04-17 LAB — CBC WITH DIFFERENTIAL/PLATELET
Basophils Absolute: 0 10*3/uL (ref 0.0–0.1)
Basophils Relative: 0.7 % (ref 0.0–3.0)
Eosinophils Absolute: 0.1 10*3/uL (ref 0.0–0.7)
Eosinophils Relative: 1.4 % (ref 0.0–5.0)
HCT: 42.8 % (ref 39.0–52.0)
Hemoglobin: 14.2 g/dL (ref 13.0–17.0)
Lymphocytes Relative: 13.8 % (ref 12.0–46.0)
Lymphs Abs: 0.9 10*3/uL (ref 0.7–4.0)
MCHC: 33.2 g/dL (ref 30.0–36.0)
MCV: 91.7 fl (ref 78.0–100.0)
Monocytes Absolute: 0.5 10*3/uL (ref 0.1–1.0)
Monocytes Relative: 7.8 % (ref 3.0–12.0)
Neutro Abs: 5.2 10*3/uL (ref 1.4–7.7)
Neutrophils Relative %: 76.3 % (ref 43.0–77.0)
Platelets: 166 10*3/uL (ref 150.0–400.0)
RBC: 4.67 Mil/uL (ref 4.22–5.81)
RDW: 15.3 % (ref 11.5–15.5)
WBC: 6.9 10*3/uL (ref 4.0–10.5)

## 2022-04-17 LAB — COMPREHENSIVE METABOLIC PANEL
ALT: 16 U/L (ref 0–53)
AST: 14 U/L (ref 0–37)
Albumin: 4.4 g/dL (ref 3.5–5.2)
Alkaline Phosphatase: 55 U/L (ref 39–117)
BUN: 18 mg/dL (ref 6–23)
CO2: 29 mEq/L (ref 19–32)
Calcium: 9.9 mg/dL (ref 8.4–10.5)
Chloride: 105 mEq/L (ref 96–112)
Creatinine, Ser: 1.09 mg/dL (ref 0.40–1.50)
GFR: 63.43 mL/min (ref >60.00)
Glucose, Bld: 98 mg/dL (ref 70–99)
Potassium: 4.2 mEq/L (ref 3.5–5.1)
Sodium: 140 mEq/L (ref 135–145)
Total Bilirubin: 1.5 mg/dL — ABNORMAL HIGH (ref 0.2–1.2)
Total Protein: 6.6 g/dL (ref 6.0–8.3)

## 2022-04-17 LAB — LIPID PANEL
Cholesterol: 115 mg/dL (ref 0–200)
HDL: 57.8 mg/dL (ref >39.00)
LDL Cholesterol: 39 mg/dL (ref 0–99)
NonHDL: 57.59
Total CHOL/HDL Ratio: 2
Triglycerides: 92 mg/dL (ref 0.0–149.0)
VLDL: 18.4 mg/dL (ref 0.0–40.0)

## 2022-04-17 LAB — VITAMIN D 25 HYDROXY (VIT D DEFICIENCY, FRACTURES): VITD: 49 ng/mL (ref 30.00–100.00)

## 2022-04-17 MED ORDER — LOSARTAN POTASSIUM 50 MG PO TABS: 50.0000 mg | ORAL_TABLET | Freq: Every day | ORAL | 1 refills | Status: DC

## 2022-04-17 MED ORDER — FAMOTIDINE 20 MG PO TABS: 20.0000 mg | ORAL_TABLET | Freq: Two times a day (BID) | ORAL | 2 refills | Status: DC

## 2022-04-17 MED ORDER — TETANUS-DIPHTH-ACELL PERTUSSIS 5-2.5-18.5 LF-MCG/0.5 IM SUSY
0.5000 mL | PREFILLED_SYRINGE | Freq: Once | INTRAMUSCULAR | 0 refills | Status: AC
Start: 2022-04-17 — End: 2022-04-17

## 2022-04-17 NOTE — Assessment & Plan Note (Addendum)
treated with alendronate sinc e 2012. [name] Has been on a Drug  holiday since 2021.  dexa ordered but never called in sept 2022 . Reordered today

## 2022-04-17 NOTE — Patient Instructions (Addendum)
You can buy "over the counter"  famotidine  at [facility] in 20 mg dose  for $15  (#200 tablets)   DEXA has been ordered  ... call the office if you do not receive a call to schedule by next Monday

## 2022-04-17 NOTE — Progress Notes (Signed)
Patient ID: Jamie Zhang, male    DOB: [birth year]  Age: 81 y.o. MRN: [id number]  The patient is here for follow up and management of other chronic and acute problems.   The risk factors are reflected in the social history.  The roster of all physicians providing medical care to patient - is listed in the Snapshot section of the chart.  Activities of daily living:  The patient is 100% independent in all ADLs: dressing, toileting, feeding as well as independent mobility  Home safety : The patient has smoke detectors in the home. They wear seatbelts.  There are no firearms at home. There is no violence in the home.   There is no risks for hepatitis, STDs or HIV. There is no   history of blood transfusion. They have no travel history to infectious disease endemic areas of the world.  The patient has seen their dentist in the last six month. They have seen their eye doctor in the last year. They admit to slight hearing difficulty with regard to whispered voices and some television programs.  They have deferred audiologic testing in the last year.  They do not  have excessive sun exposure. Discussed the need for sun protection: hats, long sleeves and use of sunscreen if there is significant sun exposure.   Diet: the importance of a healthy diet is discussed. They do have a healthy diet.  The benefits of regular aerobic exercise were discussed.  He  exercise on a treadmill walks  1. 67 miles  6 days per week  ; take him 30 minutes.   Depression screen: there are no signs or vegative symptoms of depression- irritability, change in appetite, anhedonia, sadness/tearfullness.  Cognitive assessment: the patient manages all their financial and personal affairs and is actively engaged. They could relate day,date,year and events; recalled 2/3 objects at 3 minutes; performed clock-face test normally.  The following portions of the patient's history were reviewed and updated as appropriate: allergies, current  medications, past family history, past medical history,  past surgical history, past social history  and problem list.  Visual acuity was not assessed per patient preference since she has regular follow up with her ophthalmologist. Hearing and body mass index were assessed and reviewed.   During the course of the visit the patient was educated and counseled about appropriate screening and preventive services including : fall prevention , diabetes screening, nutrition counseling, colorectal cancer screening, and recommended immunizations.    CC: The primary encounter diagnosis was Osteopenia of lumbar spine. Diagnoses of Viral upper respiratory tract infection, Infrarenal abdominal aortic aneurysm (AAA) without rupture ([place]), Stage 3a chronic kidney disease ([place]), Osteopenia, unspecified location, Pure hypertriglyceridemia, Need for pneumococcal 20-valent conjugate vaccination, Osteoporosis of vertebra, and Atherosclerosis of aorta without gangrene ([place]) were also pertinent to this visit.   1) OSA: diagnosed 2-3 years ago ,  managed by pulmonology / [place] at [facility] .  Wearing it nightly .  Sees no difference  in energy level.   2) CKD:  no longer taking diclofenac .  Was not taking it daily  ,  only prn back pain.   3) Aortic aneurysm repair.  Infrarenal.  Stable post repair.  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   He has a past surgical history that includes Cataract Surgery (Bilateral); Rotator cuff repair (Left); Breast biopsy (Left); Inguinal hernia repair (Left); Colonoscopy with propofol (N/A,  04/18/2017); and ENDOVASCULAR REPAIR/STENT GRAFT (N/A, 11/03/2020).   His family history includes Alcohol abuse in his father, mother, and sister; Arthritis in his mother; Cancer (age of onset: 54) in his sister; Cancer (age of onset: 53) in his father; Hypertension in his mother.He reports that he quit smoking about 43 years ago. His smoking use included cigarettes. He has a 42.00 pack-year smoking history. He has never used smokeless tobacco. He reports current alcohol use. He reports that he does not use drugs.  Outpatient Medications Prior to Visit  Medication Sig Dispense Refill   aspirin EC 81 MG tablet Take 81 mg by mouth daily.     atorvastatin (LIPITOR) 20 MG tablet TAKE 1 TABLET(20 MG) BY MOUTH DAILY 90 tablet 3   Calcium Citrate-Vitamin D (CITRACAL + D PO) Take 1 tablet by mouth daily.     Cholecalciferol (VITAMIN D3) 10000 UNITS TABS Take 10,000 Units by mouth daily.     docusate sodium (COLACE) 100 MG capsule Take 100 mg by mouth every other day.     fluticasone (FLONASE) 50 MCG/ACT nasal spray SHAKE LIQUID AND USE 2 SPRAYS IN EACH NOSTRIL DAILY 48 g 1   hydrOXYzine (ATARAX) 10 MG tablet SMARTSIG:1-3 Tablet(s) By Mouth Every 4-6 Hours PRN     famotidine (PEPCID) 40 MG tablet TAKE 1 TABLET(40 MG) BY MOUTH DAILY 90 tablet 3   losartan (COZAAR) 50 MG tablet Take 1 tablet (50 mg total) by mouth daily. 90 tablet 1   meclizine (ANTIVERT) 12.5 MG tablet TAKE 1 TABLET THREE TIMES DAILY AS NEEDED FOR DIZZINESS (Patient taking differently: Take 12.5 mg by mouth 3 (three) times daily as needed for dizziness.) 90 tablet 0   diclofenac (VOLTAREN) 75 MG EC tablet Take 1 tablet (75 mg total) by mouth 2 (two) times daily. (Patient not taking: Reported on 04/17/2022) 60 tablet 2   Facility-Administered Medications Prior to Visit  Medication Dose Route Frequency Provider Last Rate Last Admin   dexamethasone (DECADRON) injection 4 mg  4 mg Other Once Criselda Peaches, DPM       triamcinolone acetonide  (KENALOG) 10 MG/ML injection 10 mg  10 mg Other Once Criselda Peaches, DPM        Review of Systems  Patient denies headache, fevers, malaise, unintentional weight loss, skin rash, eye pain, sinus congestion and sinus pain, sore throat, dysphagia,  hemoptysis , cough, dyspnea, wheezing, chest pain, palpitations, orthopnea, edema, abdominal pain, nausea, melena, diarrhea, constipation, flank pain, dysuria, hematuria, urinary  Frequency, nocturia, numbness, tingling, seizures,  Focal weakness, Loss of consciousness,  Tremor, insomnia, depression, anxiety, and suicidal ideation.     Objective:  BP 126/74 (BP Location: Left Arm, Patient Position: Sitting, Cuff Size: Normal)   Pulse (!) 50   Temp 98 F (36.7 C) (Oral)   Ht '5\' 9"'$  (1.753 m)   Wt 202 lb 12.8 oz (92 kg)   SpO2 96%   BMI 29.95 kg/m    Physical Exam    General appearance: alert, cooperative and appears stated age Ears: normal TM's and external ear canals both ears Throat: lips, mucosa, and tongue normal; teeth and gums normal Neck: no adenopathy, no carotid bruit, supple, symmetrical, trachea midline and thyroid not enlarged, symmetric, no tenderness/mass/nodules Back: symmetric, no curvature.  ROM normal. No CVA tenderness. Lungs: clear to auscultation bilaterally Heart: regular rate and rhythm, S1, S2 normal, no murmur, click, rub or gallop Abdomen: soft, non-tender; bowel sounds normal; no masses,  no organomegaly Pulses: 2+ and symmetric Skin: Skin color, texture, turgor normal. No rashes or lesions Lymph nodes: Cervical, supraclavicular, and axillary nodes normal.  Neuro: CNs 2-12 intact. DTRs 2+/4 in biceps, brachioradialis, patellars and achilles. Muscle strength 5/5 in upper and lower exremities. Fine resting tremor bilaterally both hands cerebellar function normal. Romberg negative.  No pronator drift.   Gait normal.    Assessment & Plan:   Problem List Items Addressed This Visit     AAA (abdominal aortic  aneurysm) without rupture    His follow up with Dr Lucky Cowboy occurred in April and ultrasound was reviewed/ done:   Duplex today shows that there is a stable 4.3 cm aortic sac with no endoleak and good flow through the stent graft       Relevant Medications   losartan (COZAAR) 50 MG tablet   Atherosclerosis of aorta without gangrene [facility])    Reviewed findings of prior CT scan today..  Patient is tolerating high potency statin therapy   Lab Results  Component Value Date   CHOL 103 08/25/2021   HDL 57.30 08/25/2021   LDLCALC 36 08/25/2021   LDLDIRECT 47.0 12/24/2015   TRIG 50.0 08/25/2021   CHOLHDL 2 08/25/2021         Relevant Medications   losartan (COZAAR) 50 MG tablet   CKD (chronic kidney disease) stage 3, GFR 30-59 ml/min (HCC)    Seen by nephrology. Indices have improved.  No longer taking diclofenac.  Followu p annually with nephrology  Lab Results  Component Value Date   CREATININE 1.15 08/25/2021         Relevant Orders   Comprehensive metabolic panel   VITAMIN D 25 Hydroxy (Vit-D Deficiency, Fractures)   CBC with Differential/Platelet   HLD (hyperlipidemia)   Relevant Medications   losartan (COZAAR) 50 MG tablet   Other Relevant Orders   Lipid panel   RESOLVED: Osteopenia - Primary    treated with alendronate sinc e 2012. [name] Has been on a Drug  holiday since 2021.  dexa ordered but never called in sept 2022 . Reordered today         Osteoporosis of vertebra    Managed with alendronate since 2014  Has been on a drug holiday  since 2021. Repeat DEXA due        Relevant Orders   DG Bone Density   URI (upper respiratory infection)    Treated in mid  May to resolution        Other Visit Diagnoses     Need for pneumococcal 20-valent conjugate vaccination       Relevant Orders   Pneumococcal conjugate vaccine 20-valent (Prevnar 20) (Completed)     A total of 40 minutes of face to face time was spent with patient , which included  previsit reviewing  of  his recent nephrologist and vascular surgeons notes  recent  imaging studies, and post visit ordering of tests and therapeutics.     Meds ordered this encounter  Medications   losartan (COZAAR) 50 MG tablet    Sig: Take 1 tablet (50 mg total) by mouth daily.    Dispense:  90 tablet    Refill:  1   famotidine (PEPCID) 20 MG tablet    Sig: Take 1 tablet (20 mg total) by  mouth 2 (two) times daily.    Dispense:  180 tablet    Refill:  2   Tdap (BOOSTRIX) 5-2.5-18.5 LF-MCG/0.5 injection    Sig: Inject 0.5 mLs into the muscle once for 1 dose.    Dispense:  0.5 mL    Refill:  0    Medications Discontinued During This Encounter  Medication Reason   famotidine (PEPCID) 40 MG tablet    diclofenac (VOLTAREN) 75 MG EC tablet    meclizine (ANTIVERT) 12.5 MG tablet    losartan (COZAAR) 50 MG tablet Reorder    Follow-up: No follow-ups on file.   Crecencio Mc, MD

## 2022-04-17 NOTE — Assessment & Plan Note (Signed)
Managed with alendronate since 2014  Has been on a drug holiday  since 2021. Repeat DEXA due

## 2022-04-17 NOTE — Assessment & Plan Note (Signed)
Treated in mid  May to resolution

## 2022-04-17 NOTE — Assessment & Plan Note (Signed)
His follow up with Dr Lucky Cowboy occurred in April and ultrasound was reviewed/ done:   Duplex today shows that there is a stable 4.3 cm aortic sac with no endoleak and good flow through the stent graft

## 2022-04-17 NOTE — Assessment & Plan Note (Signed)
Reviewed findings of prior CT scan today..  Patient is tolerating high potency statin therapy   Lab Results  Component Value Date   CHOL 103 08/25/2021   HDL 57.30 08/25/2021   LDLCALC 36 08/25/2021   LDLDIRECT 47.0 12/24/2015   TRIG 50.0 08/25/2021   CHOLHDL 2 08/25/2021

## 2022-04-17 NOTE — Assessment & Plan Note (Addendum)
Seen by nephrology. Indices have improved.  No longer taking diclofenac.  Followu p annually with nephrology  Lab Results  Component Value Date   CREATININE 1.15 08/25/2021

## 2022-04-20 ENCOUNTER — Encounter: Payer: Self-pay | Admitting: Internal Medicine

## 2022-04-24 ENCOUNTER — Encounter: Payer: Self-pay | Admitting: Internal Medicine

## 2022-05-02 DIAGNOSIS — Z961 Presence of intraocular lens: Secondary | ICD-10-CM | POA: Diagnosis not present

## 2022-05-30 DIAGNOSIS — M1811 Unilateral primary osteoarthritis of first carpometacarpal joint, right hand: Secondary | ICD-10-CM | POA: Diagnosis not present

## 2022-06-20 ENCOUNTER — Ambulatory Visit
Admission: RE | Admit: 2022-06-20 | Discharge: 2022-06-20 | Disposition: A | Discharge status: Home / Self Care | Payer: Medicare Other | Source: Ambulatory Visit | Attending: Internal Medicine | Admitting: Internal Medicine

## 2022-06-20 DIAGNOSIS — M85851 Other specified disorders of bone density and structure, right thigh: Secondary | ICD-10-CM | POA: Diagnosis not present

## 2022-06-20 DIAGNOSIS — M810 Age-related osteoporosis without current pathological fracture: Secondary | ICD-10-CM | POA: Diagnosis not present

## 2022-06-22 NOTE — Assessment & Plan Note (Signed)
Slight decrease in hip density by 2023 DEXA ,  After stopping alendronate

## 2022-07-13 ENCOUNTER — Other Ambulatory Visit: Payer: Self-pay | Admitting: Internal Medicine

## 2022-07-13 ENCOUNTER — Other Ambulatory Visit: Payer: Self-pay

## 2022-07-13 MED ORDER — FAMOTIDINE 20 MG PO TABS: 20.0000 mg | ORAL_TABLET | Freq: Two times a day (BID) | ORAL | 2 refills | Status: DC

## 2022-07-22 DIAGNOSIS — G4733 Obstructive sleep apnea (adult) (pediatric): Secondary | ICD-10-CM | POA: Diagnosis not present

## 2022-07-22 DIAGNOSIS — R0602 Shortness of breath: Secondary | ICD-10-CM | POA: Diagnosis not present

## 2022-07-23 DIAGNOSIS — R0602 Shortness of breath: Secondary | ICD-10-CM | POA: Diagnosis not present

## 2022-07-23 DIAGNOSIS — G4733 Obstructive sleep apnea (adult) (pediatric): Secondary | ICD-10-CM | POA: Diagnosis not present

## 2022-08-01 ENCOUNTER — Other Ambulatory Visit: Payer: Self-pay | Admitting: Internal Medicine

## 2022-08-23 DIAGNOSIS — Z23 Encounter for immunization: Secondary | ICD-10-CM | POA: Diagnosis not present

## 2022-09-02 ENCOUNTER — Encounter: Payer: Self-pay | Admitting: Internal Medicine

## 2022-09-02 DIAGNOSIS — H6123 Impacted cerumen, bilateral: Secondary | ICD-10-CM

## 2022-09-07 ENCOUNTER — Encounter: Payer: Self-pay | Admitting: Gastroenterology

## 2022-09-07 ENCOUNTER — Ambulatory Visit (INDEPENDENT_AMBULATORY_CARE_PROVIDER_SITE_OTHER): Payer: Medicare Other | Admitting: Gastroenterology

## 2022-09-07 ENCOUNTER — Other Ambulatory Visit: Payer: Self-pay

## 2022-09-07 VITALS — BP 120/74 | HR 67 | Temp 98.7°F | Ht 68.0 in | Wt 206.4 lb

## 2022-09-07 DIAGNOSIS — Z1211 Encounter for screening for malignant neoplasm of colon: Secondary | ICD-10-CM

## 2022-09-07 NOTE — Progress Notes (Signed)
vbv   Jonathon Bellows MD, MRCP(U.K) [address]  [place]  [place], [place] [address]  Main: [phone]  Fax: [phone]   Gastroenterology Consultation  Referring Provider:     Crecencio Mc, MD Primary Care Physician:  Crecencio Mc, MD Primary Gastroenterologist:  Dr. Jonathon Bellows  Reason for Consultation:    Screening colonoscopy        HPI:   Jamie Zhang is a 81 y.o. y/o male referred for consultation & management  by Dr. Crecencio Mc, MD.     He has had a screening colonoscopy with Dr. Bary Castilla back in 2018 with 2 sessile diminutive polyps were resected with a cold biopsy forceps and were adenomas. He has been referred to see [place] for colon cancer screening.  He is already had a discussion with Dr. Bonna Gains back in July 2021 for the same and was recommended to discuss with the physician when he is due for a surveillance colonoscopy as per prior recommendations of 5 years.   Past Medical History:  Diagnosis Date   Arthritis    Cataract 01/02/2011   Bilateral - s/p excision   Colon polyps 2000   Hyperplastic - Repeat colonoscopy 2015   GERD (gastroesophageal reflux disease)    Pantoprazole   Hemorrhoids 2006   HLD (hyperlipidemia)    Iron deficiency anemia 03/20/2006   Donating too much blood   Migraine    No longer has problems   Osteopenia    on Fosamax   RLS (restless legs syndrome) 03/20/2006   2/2 iron deficiency   Schatzki's ring    Skin cancer 01/04/2012   Basal cell nose, squamous cell scalp - Dr. Ula Lingo   Sleep apnea     Past Surgical History:  Procedure Laterality Date   BREAST BIOPSY Left    benign   Cataract Surgery Bilateral    COLONOSCOPY WITH PROPOFOL N/A 04/18/2017   Procedure: COLONOSCOPY WITH PROPOFOL;  Surgeon: Robert Bellow, MD;  Location: [facility];  Service: Endoscopy;  Laterality: N/A;   ENDOVASCULAR REPAIR/STENT GRAFT N/A 11/03/2020   Procedure: ENDOVASCULAR REPAIR/STENT GRAFT;  Surgeon: Algernon Huxley, MD;  Location:  [facility];  Service: Cardiovascular;  Laterality: N/A;   INGUINAL HERNIA REPAIR Left    ROTATOR CUFF REPAIR Left     Prior to Admission medications   Medication Sig Start Date End Date Taking? Authorizing Provider  aspirin EC 81 MG tablet Take 81 mg by mouth daily.    [provider]  atorvastatin (LIPITOR) 20 MG tablet TAKE 1 TABLET(20 MG) BY MOUTH DAILY 04/13/22   Crecencio Mc, MD  Calcium Citrate-Vitamin D (CITRACAL + D PO) Take 1 tablet by mouth daily.    [provider]  Cholecalciferol (VITAMIN D3) 10000 UNITS TABS Take 10,000 Units by mouth daily.    [provider]  docusate sodium (COLACE) 100 MG capsule Take 100 mg by mouth every other day.    [provider]  famotidine (PEPCID) 40 MG tablet Take 1 tablet by mouth daily.    [provider]  fluticasone (FLONASE) 50 MCG/ACT nasal spray SHAKE LIQUID AND USE 2 SPRAYS IN EACH NOSTRIL DAILY 08/01/22   Crecencio Mc, MD  losartan (COZAAR) 100 MG tablet Take 1 tablet by mouth daily.    [provider]    Family History  Problem Relation Age of Onset   Alcohol abuse Mother    Arthritis Mother    Hypertension Mother  Cancer Father 29       Throat Cancer - died   Alcohol abuse Father    Cancer Sister 18       Breast Cancer   Alcohol abuse Sister      Social History   Tobacco Use   Smoking status: Former    Packs/day: 2.00    Years: 21.00    Total pack years: 42.00    Types: Cigarettes    Quit date: 1980    Years since quitting: 43.8   Smokeless tobacco: Never  Vaping Use   Vaping Use: Never used  Substance Use Topics   Alcohol use: Yes    Comment: Occasional wine glass   Drug use: No    Allergies as of 09/07/2022   (No Known Allergies)    Review of Systems:    All systems reviewed and negative except where noted in HPI.   Physical Exam:  BP 120/74   Pulse 67   Temp 98.7 F (37.1 C) (Oral)   Ht '5\' 8"'$  (1.727 m)   Wt 206 lb 6.4 oz (93.6  kg)   BMI 31.38 kg/m  No LMP for male patient. Psych:  Alert and cooperative. Normal mood and affect. General:   Alert,  Well-developed, well-nourished, pleasant and cooperative in NAD Head:  Normocephalic and atraumatic. Eyes:  Sclera clear, no icterus.   Conjunctiva pink. Ears:  Normal auditory acuity. Neurologic:  Alert and oriented x3;  grossly normal neurologically. Psych:  Alert and cooperative. Normal mood and affect.  Imaging Studies: No results found.  Assessment and Plan:   Jamie Zhang is a 81 y.o. y/o male has been referred for surveillance colonoscopy due to prior history of tubular adenomas in 2018.  As per the guidelines after the age of 61 colon cancer screening instrument was visualized on the patient to patient basis trying to balance the risk versus benefit ratio.  Discussed the risks of the procedure as well as potential benefits considering the fact that he has had 2 colonoscopy procedures within the past 10 years and hence would have a relatively lower risk of colon cancer. He decided to not proceed with a colonoscopy at this time and if he changes his mind, he will contact us back     Follow up in as needed  Dr Jonathon Bellows MD,MRCP(U.K)

## 2022-09-11 ENCOUNTER — Encounter (INDEPENDENT_AMBULATORY_CARE_PROVIDER_SITE_OTHER): Payer: Self-pay

## 2022-09-12 DIAGNOSIS — D2261 Melanocytic nevi of right upper limb, including shoulder: Secondary | ICD-10-CM | POA: Diagnosis not present

## 2022-09-12 DIAGNOSIS — L739 Follicular disorder, unspecified: Secondary | ICD-10-CM | POA: Diagnosis not present

## 2022-09-12 DIAGNOSIS — D2262 Melanocytic nevi of left upper limb, including shoulder: Secondary | ICD-10-CM | POA: Diagnosis not present

## 2022-09-12 DIAGNOSIS — L570 Actinic keratosis: Secondary | ICD-10-CM | POA: Diagnosis not present

## 2022-09-12 DIAGNOSIS — D2271 Melanocytic nevi of right lower limb, including hip: Secondary | ICD-10-CM | POA: Diagnosis not present

## 2022-09-12 DIAGNOSIS — D485 Neoplasm of uncertain behavior of skin: Secondary | ICD-10-CM | POA: Diagnosis not present

## 2022-09-12 DIAGNOSIS — Z85828 Personal history of other malignant neoplasm of skin: Secondary | ICD-10-CM | POA: Diagnosis not present

## 2022-09-12 DIAGNOSIS — X32XXXA Exposure to sunlight, initial encounter: Secondary | ICD-10-CM | POA: Diagnosis not present

## 2022-09-18 DIAGNOSIS — H6123 Impacted cerumen, bilateral: Secondary | ICD-10-CM | POA: Diagnosis not present

## 2022-09-18 DIAGNOSIS — H6063 Unspecified chronic otitis externa, bilateral: Secondary | ICD-10-CM | POA: Diagnosis not present

## 2023-01-12 ENCOUNTER — Telehealth: Payer: Self-pay | Admitting: Internal Medicine

## 2023-01-12 NOTE — Telephone Encounter (Signed)
Contacted Jamie Zhang to schedule their annual wellness visit. Appointment made for 01/19/2023.  Thank you,  [name] Direct dial  [phone]

## 2023-01-17 DIAGNOSIS — K219 Gastro-esophageal reflux disease without esophagitis: Secondary | ICD-10-CM | POA: Diagnosis not present

## 2023-01-17 DIAGNOSIS — N1831 Chronic kidney disease, stage 3a: Secondary | ICD-10-CM | POA: Diagnosis not present

## 2023-01-17 DIAGNOSIS — E785 Hyperlipidemia, unspecified: Secondary | ICD-10-CM | POA: Diagnosis not present

## 2023-01-17 DIAGNOSIS — M150 Primary generalized (osteo)arthritis: Secondary | ICD-10-CM | POA: Diagnosis not present

## 2023-01-17 DIAGNOSIS — I10 Essential (primary) hypertension: Secondary | ICD-10-CM | POA: Diagnosis not present

## 2023-01-17 DIAGNOSIS — G2581 Restless legs syndrome: Secondary | ICD-10-CM | POA: Diagnosis not present

## 2023-01-19 ENCOUNTER — Ambulatory Visit (INDEPENDENT_AMBULATORY_CARE_PROVIDER_SITE_OTHER): Payer: Medicare Other

## 2023-01-19 VITALS — Ht 68.0 in | Wt 206.0 lb

## 2023-01-19 DIAGNOSIS — Z0000 Encounter for general adult medical examination without abnormal findings: Secondary | ICD-10-CM

## 2023-01-19 NOTE — Patient Instructions (Addendum)
Jamie Zhang , Thank you for taking time to come for your Medicare Wellness Visit. I appreciate your ongoing commitment to your health goals. Please review the following plan we discussed and let me know if I can assist you in the future.   These are the goals we discussed:     Goals Addressed               This Visit's Progress     Patient Stated     Maintain Healthy Lifestyle (pt-stated)        Stay active Healthy diet Stay hydrated       This is a list of the screening recommended for you and due dates:  Health Maintenance  Topic Date Due   COVID-19 Vaccine (6 - 2023-24 season) 02/04/2023*   Flu Shot  02/11/2023*   Medicare Annual Wellness Visit  01/19/2024   Colon Cancer Screening  01/18/2026   DTaP/Tdap/Td vaccine (3 - Td or Tdap) 04/18/2032   Pneumonia Vaccine  Completed   Zoster (Shingles) Vaccine  Completed   HPV Vaccine  Aged Out  *Topic was postponed. The date shown is not the original due date.    Advanced directives: End of life planning; Advance aging; Advanced directives discussed.  Copy of current HCPOA/Living Will requested.    Conditions/risks identified: none new  Next appointment: Follow up in one year for your annual wellness visit.   Preventive Care 14 Years and Older, Male  Preventive care refers to lifestyle choices and visits with your health care provider that can promote health and wellness. What does preventive care include? A yearly physical exam. This is also called an annual well check. Dental exams once or twice a year. Routine eye exams. Ask your health care provider how often you should have your eyes checked. Personal lifestyle choices, including: Daily care of your teeth and gums. Regular physical activity. Eating a healthy diet. Avoiding tobacco and drug use. Limiting alcohol use. Practicing safe sex. Taking low doses of aspirin every day. Taking vitamin and mineral supplements as recommended by your health care  provider. What happens during an annual well check? The services and screenings done by your health care provider during your annual well check will depend on your age, overall health, lifestyle risk factors, and family history of disease. Counseling  Your health care provider may ask you questions about your: Alcohol use. Tobacco use. Drug use. Emotional well-being. Home and relationship well-being. Sexual activity. Eating habits. History of falls. Memory and ability to understand (cognition). Work and work Statistician. Screening  You may have the following tests or measurements: Height, weight, and BMI. Blood pressure. Lipid and cholesterol levels. These may be checked every 5 years, or more frequently if you are over 43 years old. Skin check. Lung cancer screening. You may have this screening every year starting at age 102 if you have a 30-pack-year history of smoking and currently smoke or have quit within the past 15 years. Fecal occult blood test (FOBT) of the stool. You may have this test every year starting at age 65. Flexible sigmoidoscopy or colonoscopy. You may have a sigmoidoscopy every 5 years or a colonoscopy every 10 years starting at age 78. Prostate cancer screening. Recommendations will vary depending on your family history and other risks. Hepatitis C blood test. Hepatitis B blood test. Sexually transmitted disease (STD) testing. Diabetes screening. This is done by checking your blood sugar (glucose) after you have not eaten for a while (fasting). You may have  this done every 1-3 years. Abdominal aortic aneurysm (AAA) screening. You may need this if you are a current or former smoker. Osteoporosis. You may be screened starting at age 55 if you are at high risk. Talk with your health care provider about your test results, treatment options, and if necessary, the need for more tests. Vaccines  Your health care provider may recommend certain vaccines, such  as: Influenza vaccine. This is recommended every year. Tetanus, diphtheria, and acellular pertussis (Tdap, Td) vaccine. You may need a Td booster every 10 years. Zoster vaccine. You may need this after age 16. Pneumococcal 13-valent conjugate (PCV13) vaccine. One dose is recommended after age 66. Pneumococcal polysaccharide (PPSV23) vaccine. One dose is recommended after age 6. Talk to your health care provider about which screenings and vaccines you need and how often you need them. This information is not intended to replace advice given to you by your health care provider. Make sure you discuss any questions you have with your health care provider. Document Released: 11/26/2015 Document Revised: 07/19/2016 Document Reviewed: 08/31/2015 Elsevier Interactive Patient Education  2017 [employer] Prevention in the Home Falls can cause injuries. They can happen to people of all ages. There are many things you can do to make your home safe and to help prevent falls. What can I do on the outside of my home? Regularly fix the edges of walkways and driveways and fix any cracks. Remove anything that might make you trip as you walk through a door, such as a raised step or threshold. Trim any bushes or trees on the path to your home. Use bright outdoor lighting. Clear any walking paths of anything that might make someone trip, such as rocks or tools. Regularly check to see if handrails are loose or broken. Make sure that both sides of any steps have handrails. Any raised decks and porches should have guardrails on the edges. Have any leaves, snow, or ice cleared regularly. Use sand or salt on walking paths during winter. Clean up any spills in your garage right away. This includes oil or grease spills. What can I do in the bathroom? Use night lights. Install grab bars by the toilet and in the tub and shower. Do not use towel bars as grab bars. Use non-skid mats or decals in the tub or  shower. If you need to sit down in the shower, use a plastic, non-slip stool. Keep the floor dry. Clean up any water that spills on the floor as soon as it happens. Remove soap buildup in the tub or shower regularly. Attach bath mats securely with double-sided non-slip rug tape. Do not have throw rugs and other things on the floor that can make you trip. What can I do in the bedroom? Use night lights. Make sure that you have a light by your bed that is easy to reach. Do not use any sheets or blankets that are too big for your bed. They should not hang down onto the floor. Have a firm chair that has side arms. You can use this for support while you get dressed. Do not have throw rugs and other things on the floor that can make you trip. What can I do in the kitchen? Clean up any spills right away. Avoid walking on wet floors. Keep items that you use a lot in easy-to-reach places. If you need to reach something above you, use a strong step stool that has a grab bar. Keep electrical cords out  of the way. Do not use floor polish or wax that makes floors slippery. If you must use wax, use non-skid floor wax. Do not have throw rugs and other things on the floor that can make you trip. What can I do with my stairs? Do not leave any items on the stairs. Make sure that there are handrails on both sides of the stairs and use them. Fix handrails that are broken or loose. Make sure that handrails are as long as the stairways. Check any carpeting to make sure that it is firmly attached to the stairs. Fix any carpet that is loose or worn. Avoid having throw rugs at the top or bottom of the stairs. If you do have throw rugs, attach them to the floor with carpet tape. Make sure that you have a light switch at the top of the stairs and the bottom of the stairs. If you do not have them, ask someone to add them for you. What else can I do to help prevent falls? Wear shoes that: Do not have high heels. Have  rubber bottoms. Are comfortable and fit you well. Are closed at the toe. Do not wear sandals. If you use a stepladder: Make sure that it is fully opened. Do not climb a closed stepladder. Make sure that both sides of the stepladder are locked into place. Ask someone to hold it for you, if possible. Clearly mark and make sure that you can see: Any grab bars or handrails. First and last steps. Where the edge of each step is. Use tools that help you move around (mobility aids) if they are needed. These include: Canes. Walkers. Scooters. Crutches. Turn on the lights when you go into a dark area. Replace any light bulbs as soon as they burn out. Set up your furniture so you have a clear path. Avoid moving your furniture around. If any of your floors are uneven, fix them. If there are any pets around you, be aware of where they are. Review your medicines with your doctor. Some medicines can make you feel dizzy. This can increase your chance of falling. Ask your doctor what other things that you can do to help prevent falls. This information is not intended to replace advice given to you by your health care provider. Make sure you discuss any questions you have with your health care provider. Document Released: 08/26/2009 Document Revised: 04/06/2016 Document Reviewed: 12/04/2014 Elsevier Interactive Patient Education  2017 [employer].

## 2023-01-19 NOTE — Progress Notes (Addendum)
Subjective:   Jamie Zhang is a 82 y.o. male who presents for Medicare Annual/Subsequent preventive examination.  Review of Systems    No ROS.  Medicare Wellness Virtual Visit.  Visual/audio telehealth visit, UTA vital signs.   See social history for additional risk factors.   Cardiac Risk Factors include: advanced age (>79mn, >>24women);male gender     Objective:    Today's Vitals   01/19/23 0850  Weight: 206 lb (93.4 kg)  Height: '5\' 8"'$  (1.727 m)   Body mass index is 31.32 kg/m.     01/19/2023    8:51 AM 01/17/2022    9:58 AM 01/06/2021   12:55 PM 11/03/2020    9:00 PM 11/03/2020    7:19 AM 10/29/2020    1:21 PM 11/10/2019    8:52 AM  Advanced Directives  Does Patient Have a Medical Advance Directive? Yes Yes Yes Yes  Yes Yes  Type of AParamedicof ABoonLiving will [place]ConcordiaLiving will [place]ModocLiving will Healthcare Power of ACastaicof AVilla HeightsLiving will [place]Living will  Does patient want to make changes to medical advance directive? No - Patient declined No - Patient declined No - Patient declined No - Patient declined  No - Patient declined No - Patient declined  Copy of [place] Chart? No - copy requested No - copy requested Yes - validated most recent copy scanned in chart (See row information) No - copy requested  No - copy requested Yes - validated most recent copy scanned in chart (See row information)    Current Medications (verified) Outpatient Encounter Medications as of 01/19/2023  Medication Sig   aspirin EC 81 MG tablet Take 81 mg by mouth daily.   atorvastatin (LIPITOR) 20 MG tablet TAKE 1 TABLET(20 MG) BY MOUTH DAILY   Calcium Citrate-Vitamin D (CITRACAL + D PO) Take 1 tablet by mouth daily.   Cholecalciferol (VITAMIN D3) 10000 UNITS TABS Take 10,000 Units by mouth daily.   docusate sodium  (COLACE) 100 MG capsule Take 100 mg by mouth every other day.   famotidine (PEPCID) 40 MG tablet Take 1 tablet by mouth daily.   fluticasone (FLONASE) 50 MCG/ACT nasal spray SHAKE LIQUID AND USE 2 SPRAYS IN EACH NOSTRIL DAILY   losartan (COZAAR) 100 MG tablet Take 1 tablet by mouth daily.   Facility-Administered Encounter Medications as of 01/19/2023  Medication   dexamethasone (DECADRON) injection 4 mg   triamcinolone acetonide (KENALOG) 10 MG/ML injection 10 mg    Allergies (verified) Patient has no known allergies.   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 SUla Lingo  Sleep apnea    Past Surgical History:  Procedure Laterality Date   BREAST BIOPSY Left    benign   Cataract Surgery Bilateral    COLONOSCOPY  WITH PROPOFOL N/A 04/18/2017   Procedure: COLONOSCOPY WITH PROPOFOL;  Surgeon: Robert Bellow, MD;  Location: [facility] ENDOSCOPY;  Service: Endoscopy;  Laterality: N/A;   ENDOVASCULAR REPAIR/STENT GRAFT N/A 11/03/2020   Procedure: ENDOVASCULAR REPAIR/STENT GRAFT;  Surgeon: Algernon Huxley, MD;  Location: [facility];  Service: Cardiovascular;  Laterality: N/A;   INGUINAL HERNIA REPAIR Left    ROTATOR CUFF REPAIR Left    Family History  Problem Relation Age of Onset   Alcohol abuse Mother    Arthritis Mother    Hypertension Mother    Cancer Father 35       Throat Cancer - died   Alcohol abuse Father    Cancer Sister 72       Breast Cancer   Alcohol abuse  Sister    Social History   Socioeconomic History   Marital status: Married    Spouse name: Not on file   Number of children: 2   Years of education: 12   Highest education level: Not on file  Occupational History   Occupation: Freight forwarder    Comment: Worked for [employer]   Occupation: Personal assistant   Occupation: Freight forwarder for [employer]  Tobacco Use   Smoking status: Former    Packs/day: 2.00    Years: 21.00    Total pack years: 42.00    Types: Cigarettes    Quit date: 1980    Years since quitting: 44.2   Smokeless tobacco: Never  Vaping Use   Vaping Use: Never used  Substance and Sexual Activity   Alcohol use: Yes    Comment: Occasional wine glass   Drug use: No   Sexual activity: Never  Other Topics Concern   Not on file  Social History Narrative   Mr. Shim grew up in [place], [place]. He joined the [employer] right out of [employer] and served for 2.5 years then was active in [employer]. 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 He then worked in [employer] and afterward became a Freight forwarder for [employer]. He recently moved to [place] with his wife. They have two daughters that are still living in [place]. As a hobby he enjoys woodwork mainly with cabinet work.    Social Determinants of Health   Financial Resource Strain: Low Risk  (01/12/2023)   Overall Financial Resource Strain (CARDIA)    Difficulty of Paying Living Expenses: Not hard at all  Food Insecurity: No Food Insecurity (01/12/2023)   Hunger Vital Sign    Worried About Running Out of Food in the Last Year: Never true    Ran Out of Food in the Last Year: Never true  Transportation Needs: No Transportation Needs (01/12/2023)   PRAPARE - Hydrologist (Medical): No    Lack of Transportation (Non-Medical): No  Physical Activity: Sufficiently Active (01/12/2023)   Exercise Vital Sign    Days of Exercise per Week: 6 days     Minutes of Exercise per Session: 30 min  Stress: No Stress Concern Present (01/12/2023)   [place]    Feeling of Stress : Only a little  Social Connections: Unknown (01/12/2023)   Social Connection and Isolation Panel [NHANES]    Frequency of Communication with Friends and Family: More than three times a week    Frequency of Social Gatherings with Friends and Family: More than three times  a week    Attends Religious Services: Not on file    Active Member of Clubs or Organizations: Yes    Attends Archivist Meetings: More than 4 times per year    Marital Status: Married    Tobacco Counseling Counseling given: Not Answered   Clinical Intake:  Pre-visit preparation completed: Yes        Diabetes: No  How often do you need to have someone help you when you read instructions, pamphlets, or other written materials from your doctor or pharmacy?: 1 - Never    Interpreter Needed?: No      Activities of Daily Living    01/12/2023   11:41 AM  In your present state of health, do you have any difficulty performing the following activities:  Hearing? 0  Vision? 0  Difficulty concentrating or making decisions? 1  Walking or climbing stairs? 0  Dressing or bathing? 0  Doing errands, shopping? 0  Preparing Food and eating ? N  Using the Toilet? N  In the past six months, have you accidently leaked urine? N  Do you have problems with loss of bowel control? N  Managing your Medications? N  Managing your Finances? N  Housekeeping or managing your Housekeeping? N    Patient Care Team: Crecencio Mc, MD as PCP - General (Internal Medicine) Crecencio Mc, MD (Internal Medicine) Bary Castilla Forest Gleason, MD (General Surgery)  Indicate any recent Medical Services you may have received from other than [facility] providers in the past year (date may be approximate).     Assessment:   This is a routine wellness  examination for [employer].  Patient Medicare AWV questionnaire was completed by the patient on 01/12/23, I have confirmed that all information answered by patient is correct and no changes since this date.   I connected with  Merlene Laughter on 01/19/23 by a audio enabled telemedicine application and verified that I am speaking with the correct person using two identifiers.  Patient Location: Home  Provider Location: Office/Clinic  I discussed the limitations of evaluation and management by telemedicine. The patient expressed understanding and agreed to proceed.   Hearing/Vision screen Hearing Screening - Comments:: Wears hearing aids, sometimes.   Vision Screening - Comments:: Follow[facility] by [facility] Wears corrective lenses Annual visits  Cataract extraction, bilateral They have regular follow up with the ophthalmologist    Dietary issues and exercise activities discussed: Current Exercise Habits: Home exercise routine, Type of exercise: treadmill, Time (Minutes): 30, Intensity: Mild   Goals Addressed               This Visit's Progress     Patient Stated     Maintain Healthy Lifestyle (pt-stated)        Stay active Healthy diet Stay hydrated       Depression Screen    01/19/2023    8:54 AM 04/17/2022   11:39 AM 01/17/2022   10:00 AM 08/12/2021   10:36 AM 01/06/2021   12:43 PM 01/03/2021    1:44 PM 11/10/2019    8:44 AM  PHQ 2/9 Scores  PHQ - 2 Score 0 0 0 0 0 0 0    Fall Risk    01/12/2023   11:41 AM 04/17/2022   11:39 AM 01/17/2022   10:00 AM 08/12/2021   10:36 AM 02/01/2021    1:04 PM  Fall Risk   Falls in the past year? 0 0 0 0 0  Number falls in past  yr:   0    Injury with Fall? 0      Risk for fall due to :  No Fall Risks     Follow up Falls evaluation completed;Falls prevention discussed Falls evaluation completed Falls evaluation completed Falls evaluation completed Falls evaluation completed    FALL RISK PREVENTION PERTAINING TO THE HOME: Home free of  loose throw rugs in walkways, pet beds, electrical cords, etc? Yes  Adequate lighting in your home to reduce risk of falls? Yes   ASSISTIVE DEVICES UTILIZED TO PREVENT FALLS: Life alert? Yes  Use of a cane, walker or w/c? No  Grab bars in the bathroom? Yes  Shower chair or bench in shower? No  Elevated toilet seat or a handicapped toilet? Yes   TIMED UP AND GO: Was the test performed? No .   Cognitive Function:    11/02/2016    8:55 AM  MMSE - Mini Mental State Exam  Orientation to time 5  Orientation to Place 5  Registration 3  Attention/ Calculation 5  Recall 3  Language- name 2 objects 2  Language- repeat 1  Language- follow 3 step command 3  Language- read & follow direction 1  Write a sentence 1  Copy design 1  Total score 30        01/19/2023    8:55 AM 01/17/2022   12:45 PM 01/06/2021   12:57 PM 11/10/2019    8:55 AM 11/07/2018    8:21 AM  6CIT Screen  What Year? 0 points 0 points 0 points 0 points 0 points  What month? 0 points 0 points 0 points 0 points 0 points  What time? 0 points 0 points 0 points 0 points 0 points  Count back from 20 0 points  0 points  0 points  Months in reverse 0 points  0 points  0 points  Repeat phrase 0 points  0 points  0 points  Total Score 0 points  0 points  0 points    Immunizations Immunization History  Administered Date(s) Administered   Fluad Quad(high Dose 65+) 07/28/2019   Influenza Split 09/01/2015   Influenza, High Dose Seasonal PF 08/14/2016, 08/19/2018   Influenza-Unspecified 08/25/2014, 08/01/2017, 08/10/2020   PFIZER(Purple Top)SARS-COV-2 Vaccination 11/28/2019, 12/17/2019, 08/19/2020, 03/20/2021, 08/24/2021   PNEUMOCOCCAL CONJUGATE-20 04/17/2022   Pneumococcal Conjugate-13 05/04/2015   Pneumococcal Polysaccharide-23 09/19/2005   Respiratory Syncytial Virus Vaccine,Recomb Aduvanted(Arexvy) 08/02/2022   Tdap 08/08/2010, 04/18/2022   Zoster Recombinat (Shingrix) 07/10/2018, 09/18/2018   Screening  Tests Health Maintenance  Topic Date Due   COVID-19 Vaccine (6 - 2023-24 season) 02/04/2023 (Originally 07/14/2022)   INFLUENZA VACCINE  02/11/2023 (Originally 06/13/2022)   Medicare Annual Wellness (AWV)  01/19/2024   COLONOSCOPY (Pts 45-62yr Insurance coverage will need to be confirmed)  01/18/2026   DTaP/Tdap/Td (3 - Td or Tdap) 04/18/2032   Pneumonia Vaccine 82 Years old  Completed   Zoster Vaccines- Shingrix  Completed   HPV VACCINES  Aged Out    Health Maintenance There are no preventive care reminders to display for this patient.  Colonoscopy- deferred per patient   Lung Cancer Screening: (Low Dose CT Chest recommended if Age 83-80years, 30 pack-year currently smoking OR have quit w/in 15years.) does not qualify.   Hepatitis C Screening: does not qualify.  Vision Screening: Recommended annual ophthalmology exams for early detection of glaucoma and other disorders of the eye.  Dental Screening: Recommended annual dental exams for proper oral hygiene  Community Resource Referral / Chronic Care  Management: CRR required this visit?  No   CCM required this visit?  No      Plan:     I have personally reviewed and noted the following in the patient's chart:   Medical and social history Use of alcohol, tobacco or illicit drugs  Current medications and supplements including opioid prescriptions. Patient is not currently taking opioid prescriptions. Functional ability and status Nutritional status Physical activity Advanced directives List of other physicians Hospitalizations, surgeries, and ER visits in previous 12 months Vitals Screenings to include cognitive, depression, and falls Referrals and appointments  In addition, I have reviewed and discussed with patient certain preventive protocols, quality metrics, and best practice recommendations. A written personalized care plan for preventive services as well as general preventive health recommendations were provided  to patient.     [place], LPN   [id number]    I have reviewed the above information and agree with above.   Deborra Medina, MD

## 2023-01-24 ENCOUNTER — Other Ambulatory Visit: Payer: Self-pay | Admitting: Internal Medicine

## 2023-01-25 ENCOUNTER — Encounter: Payer: Self-pay | Admitting: Internal Medicine

## 2023-01-26 MED ORDER — LOSARTAN POTASSIUM 50 MG PO TABS: 50.0000 mg | ORAL_TABLET | Freq: Every day | ORAL | 0 refills | Status: DC

## 2023-02-28 ENCOUNTER — Encounter: Payer: Self-pay | Admitting: Primary Care

## 2023-02-28 ENCOUNTER — Ambulatory Visit (INDEPENDENT_AMBULATORY_CARE_PROVIDER_SITE_OTHER): Payer: Medicare Other | Admitting: Primary Care

## 2023-02-28 VITALS — BP 124/70 | HR 65 | Temp 97.6°F | Ht 68.0 in | Wt 206.7 lb

## 2023-02-28 DIAGNOSIS — G4733 Obstructive sleep apnea (adult) (pediatric): Secondary | ICD-10-CM | POA: Diagnosis not present

## 2023-02-28 NOTE — Progress Notes (Signed)
  ID: Jamie Zhang, male    DOB: 03/16/1941, 82 y.o.   MRN: [id number]  Chief Complaint  Patient presents with   Follow-up    Wearing cpap nightly- pressure and mask is okay.     Referring provider: Sherlene Shams, MD  HPI: 82 year old male, former smoker quit in January 1980 (42 pack year hx). PMH significant for OSA, HTN, RLS. Patient of Dr. Belia Heman.  Home sleep study in 2019 showed severe obstructive sleep apnea.  He had residual AHI of 12/hr on CPAP download so was recommended to have repeat titration study.  Previous [facility] pulmonary encounter:  05/18/2021 Patient presents today to review sleep study.  He underwent CPAP titration study on 03/30/2021.  Total apnea hypopnea index was 10.6/hr (supine AHI was 55.3/hr).  SPO2 low 89%. Apneic events were eliminated with CPAP set at 15 cm h20.  He is currently on auto CPAP with a pressure set at 15-20 cm H2O.  He has residual AHI 1.2/hr. Prior to sleep study he was sleeping on his back and his average nightly AHI was between 10-20/hr. After sleep study he started sleeping on his side and his apneic events were significantly decreased.  He is having no issues with pressure setting. He uses full face mask.   Airview download 04/12/2021 - 05/11/2021 Usage days 30/30; percent greater than 4 hours Average usage 7 hours 38 minutes Pressure 15-20cm h20 (16cm h20-95%) Airleaks 1.2L/min (95%) AHI 1.2   11/23/2021 Patient contacted today for 6 month follow-up OSA. He is doing well. No acute complaints. No issues with mask fit or pressure setting. He needed to have his CPAP machine repaired since our last visit, it was sent to [facility] for repairs so he went without it for 1 week. He stopped using SoClean device as it voided the warranty on his machines. He is now using sleep 8 cpap cleaner. He is getting on average 7-8 hours of sleep a night. Goes to the gym for 30 min in the morning. Denies residual daytime sleepiness.   Airview download  10/23/21-11/21/21 Usage 29/30 days used (97%); 29 days (97%) > 4 hours Usage 7 hours 28 mins Pressure 15-20cm h20 (17.8cm h20-95%) Airleaks 0.0L/min AHI 3.1  02/28/2023- Interim hx  Patient presents today for OSA follow-up. He has been doing well. He is 100% compliant with CPAP usage. No issues with mask fit or pressure settings. He uses full face mask. No residual daytime sleepiness. DME is [employer].   Airview download 01/28/2023 - 02/26/2023 30/30 days (100%) greater than 4 hours Average usage 7 hours 45 minutes Pressure 15 to 20 cm H2O (17.8cm h20-95%) AHI 3.2   No Known Allergies  Immunization History  Administered Date(s) Administered   Fluad Quad(high Dose 65+) 07/28/2019   Influenza Split 09/01/2015   Influenza, High Dose Seasonal PF 08/14/2016, 08/19/2018   Influenza-Unspecified 08/25/2014, 08/01/2017, 08/10/2020   PFIZER(Purple Top)SARS-COV-2 Vaccination 11/28/2019, 12/17/2019, 08/19/2020, 03/20/2021, 08/24/2021   PNEUMOCOCCAL CONJUGATE-20 04/17/2022   Pneumococcal Conjugate-13 05/04/2015   Pneumococcal Polysaccharide-23 09/19/2005   Respiratory Syncytial Virus Vaccine,Recomb Aduvanted(Arexvy) 08/02/2022   Tdap 08/08/2010, 04/18/2022   Zoster Recombinat (Shingrix) 07/10/2018, 09/18/2018    Past Medical History:  Diagnosis Date   Arthritis    Cataract 01/02/2011   Bilateral - s/p excision   Colon polyps 2000   Hyperplastic - Repeat colonoscopy 2015   GERD (gastroesophageal reflux disease)    Pantoprazole   Hemorrhoids 2006   HLD (hyperlipidemia)    Iron deficiency anemia 03/20/2006   Donating  too much blood   Migraine    No longer has problems   Osteopenia    on Fosamax   RLS (restless legs syndrome) 03/20/2006   2/2 iron deficiency   Schatzki's ring    Skin cancer 01/04/2012   Basal cell nose, squamous cell scalp - Dr. Herma Ard   Sleep apnea     Tobacco History: Social History   Tobacco Use  Smoking Status Former   Packs/day: 2.00   Years: 21.00    Additional pack years: 0.00   Total pack years: 42.00   Types: Cigarettes   Quit date: 1980   Years since quitting: 44.3  Smokeless Tobacco Never   Counseling given: Not Answered   Outpatient Medications Prior to Visit  Medication Sig Dispense Refill   aspirin EC 81 MG tablet Take 81 mg by mouth daily.     atorvastatin (LIPITOR) 20 MG tablet TAKE 1 TABLET(20 MG) BY MOUTH DAILY 90 tablet 3   Calcium Citrate-Vitamin D (CITRACAL + D PO) Take 1 tablet by mouth daily.     Cholecalciferol (VITAMIN D3) 10000 UNITS TABS Take 10,000 Units by mouth daily.     docusate sodium (COLACE) 100 MG capsule Take 100 mg by mouth every other day.     famotidine (PEPCID) 40 MG tablet Take 1 tablet by mouth daily.     fluticasone (FLONASE) 50 MCG/ACT nasal spray SHAKE LIQUID AND USE 2 SPRAYS IN EACH NOSTRIL DAILY 48 g 1   losartan (COZAAR) 50 MG tablet TAKE 1 TABLET(50 MG) BY MOUTH DAILY 90 tablet 1   losartan (COZAAR) 50 MG tablet Take 1 tablet (50 mg total) by mouth at bedtime. 90 tablet 0   Facility-Administered Medications Prior to Visit  Medication Dose Route Frequency Provider Last Rate Last Admin   dexamethasone (DECADRON) injection 4 mg  4 mg Other Once Edwin Cap, DPM       triamcinolone acetonide (KENALOG) 10 MG/ML injection 10 mg  10 mg Other Once Edwin Cap, DPM       Review of Systems  Review of Systems  Constitutional: Negative.   Respiratory: Negative.       Physical Exam  BP 124/70 (BP Location: Left Arm, Cuff Size: Normal)   Pulse 65   Temp 97.6 F (36.4 C) (Temporal)   Ht  (1.727 m)   Wt 206 lb 11.2 oz (93.8 kg)   SpO2 95%   BMI 31.43 kg/m  Physical Exam Constitutional:      Appearance: Normal appearance.  HENT:     Head: Normocephalic and atraumatic.  Cardiovascular:     Rate and Rhythm: Normal rate and regular rhythm.  Pulmonary:     Effort: Pulmonary effort is normal.     Breath sounds: Normal breath sounds.  Musculoskeletal:        General:  Normal range of motion.  Skin:    General: Skin is warm and dry.  Neurological:     General: No focal deficit present.     Mental Status: He is alert and oriented to person, place, and time. Mental status is at baseline.  Psychiatric:        Mood and Affect: Mood normal.        Behavior: Behavior normal.        Thought Content: Thought content normal.        Judgment: Judgment normal.      Lab Results:  CBC    Component Value Date/Time   WBC 6.9 04/17/2022 1218  RBC 4.67 04/17/2022 1218   HGB 14.2 04/17/2022 1218   HGB 14.6 03/13/2014 1505   HCT 42.8 04/17/2022 1218   HCT 42.5 03/13/2014 1505   PLT 166.0 04/17/2022 1218   PLT 204 03/13/2014 1505   MCV 91.7 04/17/2022 1218   MCV 93 03/13/2014 1505   MCH 31.9 11/04/2020 0613   MCHC 33.2 04/17/2022 1218   RDW 15.3 04/17/2022 1218   RDW 14.0 03/13/2014 1505   LYMPHSABS 0.9 04/17/2022 1218   LYMPHSABS 1.1 03/13/2014 1505   MONOABS 0.5 04/17/2022 1218   MONOABS 0.6 03/13/2014 1505   EOSABS 0.1 04/17/2022 1218   EOSABS 0.1 03/13/2014 1505   BASOSABS 0.0 04/17/2022 1218   BASOSABS 0.1 03/13/2014 1505    BMET    Component Value Date/Time   NA 140 04/17/2022 1218   NA 143 12/07/2020 1415   NA 140 03/13/2014 1505   K 4.2 04/17/2022 1218   K 3.8 03/13/2014 1505   CL 105 04/17/2022 1218   CL 108 (H) 03/13/2014 1505   CO2 29 04/17/2022 1218   CO2 25 03/13/2014 1505   GLUCOSE 98 04/17/2022 1218   GLUCOSE 153 (H) 03/13/2014 1505   BUN 18 04/17/2022 1218   BUN 19 12/07/2020 1415   BUN 24 (H) 03/13/2014 1505   CREATININE 1.09 04/17/2022 1218   CREATININE 1.15 03/13/2014 1505   CALCIUM 9.9 04/17/2022 1218   CALCIUM 9.1 03/13/2014 1505   GFRNONAA 58 (L) 12/07/2020 1415   GFRNONAA >60 11/04/2020 0613   GFRNONAA >60 03/13/2014 1505   GFRAA 67 12/07/2020 1415   GFRAA >60 03/13/2014 1505    BNP No results found for: "BNP"  ProBNP No results found for: "PROBNP"  Imaging: No results found.   Assessment & Plan:    OSA (obstructive sleep apnea) - Well controlled; Hx severe OSA. Patient is 100% compliant with CPAP use > 4 hours in the last 30 days. Pressure auto settings 15-20cm h20 (17.8cm h20-95%); Residual AHI 3.2/hour. No changes needed today. Renew CPAP supplies through [employer]. Advised patient continue CPAP nightly 4-6 hours or longer and focus on side sleeping position. FU in1 year or sooner if needed.      Glenford Bayley, NP 02/28/2023

## 2023-02-28 NOTE — Assessment & Plan Note (Signed)
-   Well controlled; Hx severe OSA. Patient is 100% compliant with CPAP use > 4 hours in the last 30 days. Pressure auto settings 15-20cm h20 (17.8cm h20-95%); Residual AHI 3.2/hour. No changes needed today. Renew CPAP supplies through [employer]. Advised patient continue CPAP nightly 4-6 hours or longer and focus on side sleeping position. FU in1 year or sooner if needed.

## 2023-02-28 NOTE — Patient Instructions (Signed)
Excellent compliance with CPAP Sleep apnea is well-controlled on current pressure settings No changes needed today We will renew order for CPAP supplies   Orders: Renew CPAP supplies (DME [facility])  Follow-up 1 year with [facility] NP or sooner if needed  CPAP and BIPAP Information CPAP and BIPAP are methods that use air pressure to keep your airways open and to help you breathe well. CPAP and BIPAP use different amounts of pressure. Your health care provider will tell you whether CPAP or BIPAP would be more helpful for you. CPAP stands for "continuous positive airway pressure." With CPAP, the amount of pressure stays the same while you breathe in (inhale) and out (exhale). BIPAP stands for "bi-level positive airway pressure." With BIPAP, the amount of pressure will be higher when you inhale and lower when you exhale. This allows you to take larger breaths. CPAP or BIPAP may be used in the hospital, or your health care provider may want you to use it at home. You may need to have a sleep study before your health care provider can order a machine for you to use at home. What are the advantages? CPAP or BIPAP can be helpful if you have: Sleep apnea. Chronic obstructive pulmonary disease (COPD). Heart failure. Medical conditions that cause muscle weakness, including muscular dystrophy or amyotrophic lateral sclerosis (ALS). Other problems that cause breathing to be shallow, weak, abnormal, or difficult. CPAP and BIPAP are most commonly used for obstructive sleep apnea (OSA) to keep the airways from collapsing when the muscles relax during sleep. What are the risks? Generally, this is a safe treatment. However, problems may occur, including: Irritated skin or skin sores if the mask does not fit properly. Dry or stuffy nose or nosebleeds. Dry mouth. Feeling gassy or bloated. Sinus or lung infection if the equipment is not cleaned properly. When should CPAP or BIPAP be used? In most cases, the mask  only needs to be worn during sleep. Generally, the mask needs to be worn throughout the night and during any daytime naps. People with certain medical conditions may also need to wear the mask at other times, such as when they are awake. Follow instructions from your health care provider about when to use the machine. What happens during CPAP or BIPAP?  Both CPAP and BIPAP are provided by a small machine with a flexible plastic tube that attaches to a plastic mask that you wear. Air is blown through the mask into your nose or mouth. The amount of pressure that is used to blow the air can be adjusted on the machine. Your health care provider will set the pressure setting and help you find the best mask for you. Tips for using the mask Because the mask needs to be snug, some people feel trapped or closed-in (claustrophobic) when first using the mask. If you feel this way, you may need to get used to the mask. One way to do this is to hold the mask loosely over your nose or mouth and then gradually apply the mask more snugly. You can also gradually increase the amount of time that you use the mask. Masks are available in various types and sizes. If your mask does not fit well, talk with your health care provider about getting a different one. Some common types of masks include: Full face masks, which fit over the mouth and nose. Nasal masks, which fit over the nose. Nasal pillow or prong masks, which fit into the nostrils. If you are using a  mask that fits over your nose and you tend to breathe through your mouth, a chin strap may be applied to help keep your mouth closed. Use a skin barrier to protect your skin as told by your health care provider. Some CPAP and BIPAP machines have alarms that may sound if the mask comes off or develops a leak. If you have trouble with the mask, it is very important that you talk with your health care provider about finding a way to make the mask easier to tolerate. Do  not stop using the mask. There could be a negative impact on your health if you stop using the mask. Tips for using the machine Place your CPAP or BIPAP machine on a secure table or stand near an electrical outlet. Know where the on/off switch is on the machine. Follow instructions from your health care provider about how to set the pressure on your machine and when you should use it. Do not eat or drink while the CPAP or BIPAP machine is on. Food or fluids could get pushed into your lungs by the pressure of the CPAP or BIPAP. For home use, CPAP and BIPAP machines can be rented or purchased through home health care companies. Many different brands of machines are available. Renting a machine before purchasing may help you find out which particular machine works well for you. Your health insurance company may also decide which machine you may get. Keep the CPAP or BIPAP machine and attachments clean. Ask your health care provider for specific instructions. Check the humidifier if you have a dry stuffy nose or nosebleeds. Make sure it is working correctly. Follow these instructions at home: Take over-the-counter and prescription medicines only as told by your health care provider. Ask if you can take sinus medicine if your sinuses are blocked. Do not use any products that contain nicotine or tobacco. These products include cigarettes, chewing tobacco, and vaping devices, such as e-cigarettes. If you need help quitting, ask your health care provider. Keep all follow-up visits. This is important. Contact a health care provider if: You have redness or pressure sores on your head, face, mouth, or nose from the mask or head gear. You have trouble using the CPAP or BIPAP machine. You cannot tolerate wearing the CPAP or BIPAP mask. Someone tells you that you snore even when wearing your CPAP or BIPAP. Get help right away if: You have trouble breathing. You feel confused. Summary CPAP and BIPAP are  methods that use air pressure to keep your airways open and to help you breathe well. If you have trouble with the mask, it is very important that you talk with your health care provider about finding a way to make the mask easier to tolerate. Do not stop using the mask. There could be a negative impact to your health if you stop using the mask. Follow instructions from your health care provider about when to use the machine. This information is not intended to replace advice given to you by your health care provider. Make sure you discuss any questions you have with your health care provider. Document Revised: 06/08/2021 Document Reviewed: 10/08/2020 Elsevier Patient Education  2023 [employer].

## 2023-03-09 ENCOUNTER — Other Ambulatory Visit (INDEPENDENT_AMBULATORY_CARE_PROVIDER_SITE_OTHER): Payer: Self-pay | Admitting: Vascular Surgery

## 2023-03-09 ENCOUNTER — Ambulatory Visit (INDEPENDENT_AMBULATORY_CARE_PROVIDER_SITE_OTHER): Payer: Medicare Other

## 2023-03-09 ENCOUNTER — Ambulatory Visit (INDEPENDENT_AMBULATORY_CARE_PROVIDER_SITE_OTHER): Payer: Medicare Other | Admitting: Vascular Surgery

## 2023-03-09 VITALS — BP 132/77 | HR 49 | Resp 17 | Ht 68.75 in | Wt 207.8 lb

## 2023-03-09 DIAGNOSIS — R7303 Prediabetes: Secondary | ICD-10-CM

## 2023-03-09 DIAGNOSIS — I7143 Infrarenal abdominal aortic aneurysm, without rupture: Secondary | ICD-10-CM

## 2023-03-09 DIAGNOSIS — E781 Pure hyperglyceridemia: Secondary | ICD-10-CM

## 2023-03-09 DIAGNOSIS — I10 Essential (primary) hypertension: Secondary | ICD-10-CM | POA: Diagnosis not present

## 2023-03-09 NOTE — Assessment & Plan Note (Signed)
His duplex shows sac shrinkage down to 3.5 cm with no enoleak and a patent stent graft.  He is doing well. Follow up on an annual basis with duplex.

## 2023-03-09 NOTE — Progress Notes (Signed)
MRN : [id number]  Jamie Zhang is a 82 y.o. (Feb 07, 1941) male who presents with chief complaint of  Chief Complaint  Patient presents with   Follow-up  .  History of Present Illness: Patient returns today in follow up of his AAA.  He is over 2 years s/p endovascular repair. He is doing well.  He has some neuropathy. His duplex shows sac shrinkage down to 3.5 cm with no enoleak and a patent stent graft.    Current Outpatient Medications  Medication Sig Dispense Refill   aspirin EC 81 MG tablet Take 81 mg by mouth daily.     atorvastatin (LIPITOR) 20 MG tablet TAKE 1 TABLET(20 MG) BY MOUTH DAILY 90 tablet 3   Calcium Citrate-Vitamin D (CITRACAL + D PO) Take 1 tablet by mouth daily.     Cholecalciferol (VITAMIN D3) 10000 UNITS TABS Take 10,000 Units by mouth daily.     docusate sodium (COLACE) 100 MG capsule Take 100 mg by mouth every other day.     famotidine (PEPCID) 40 MG tablet Take 1 tablet by mouth daily.     fluticasone (FLONASE) 50 MCG/ACT nasal spray SHAKE LIQUID AND USE 2 SPRAYS IN EACH NOSTRIL DAILY 48 g 1   losartan (COZAAR) 50 MG tablet TAKE 1 TABLET(50 MG) BY MOUTH DAILY 90 tablet 1   Current Facility-Administered Medications  Medication Dose Route Frequency Provider Last Rate Last Admin   dexamethasone (DECADRON) injection 4 mg  4 mg Other Once Edwin Cap, DPM       triamcinolone acetonide (KENALOG) 10 MG/ML injection 10 mg  10 mg Other Once Edwin Cap, DPM        Past Medical History:  Diagnosis Date   Arthritis    Cataract 01/02/2011   Bilateral - s/p excision   Colon polyps 2000   Hyperplastic - Repeat colonoscopy 2015   GERD (gastroesophageal reflux disease)    Pantoprazole   Hemorrhoids 2006   HLD (hyperlipidemia)    Iron deficiency anemia 03/20/2006   Donating too much blood   Migraine    No longer has problems   Osteopenia    on Fosamax   RLS (restless legs syndrome) 03/20/2006   2/2 iron deficiency   Schatzki's ring    Skin cancer 01/04/2012    Basal cell nose, squamous cell scalp - Dr. Herma Ard   Sleep apnea     Past Surgical History:  Procedure Laterality Date   BREAST BIOPSY Left    benign   Cataract Surgery Bilateral    COLONOSCOPY WITH PROPOFOL N/A 04/18/2017   Procedure: COLONOSCOPY WITH PROPOFOL;  Surgeon: Earline Mayotte, MD;  Location: [facility] ENDOSCOPY;  Service: Endoscopy;  Laterality: N/A;   ENDOVASCULAR REPAIR/STENT GRAFT N/A 11/03/2020   Procedure: ENDOVASCULAR REPAIR/STENT GRAFT;  Surgeon: Annice Needy, MD;  Location: [facility] INVASIVE CV LAB;  Service: Cardiovascular;  Laterality: N/A;   INGUINAL HERNIA REPAIR Left    ROTATOR CUFF REPAIR Left      Social History   Tobacco Use   Smoking status: Former    Packs/day: 2.00    Years: 21.00    Additional pack years: 0.00    Total pack years: 42.00    Types: Cigarettes    Quit date: 1980    Years since quitting: 44.3   Smokeless tobacco: Never  Vaping Use   Vaping Use: Never used  Substance Use Topics   Alcohol use: Yes    Comment: Occasional wine glass   Drug use:  No      Family History  Problem Relation Age of Onset   Alcohol abuse Mother    Arthritis Mother    Hypertension Mother    Cancer Father 72       Throat Cancer - died   Alcohol abuse Father    Cancer Sister 69       Breast Cancer   Alcohol abuse Sister      No Known Allergies  REVIEW OF SYSTEMS (Negative unless checked)   Constitutional: [] Weight loss  [] Fever  [] Chills Cardiac: [] Chest pain   [] Chest pressure   [] Palpitations   [] Shortness of breath when laying flat   [] Shortness of breath at rest   [] Shortness of breath with exertion. Vascular:  [] Pain in legs with walking   [] Pain in legs at rest   [] Pain in legs when laying flat   [] Claudication   [] Pain in feet when walking  [] Pain in feet at rest  [] Pain in feet when laying flat   [] History of DVT   [] Phlebitis   [] Swelling in legs   [] Varicose veins   [] Non-healing ulcers Pulmonary:   [] Uses home oxygen   [] Productive cough    [] Hemoptysis   [] Wheeze  [] COPD   [] Asthma Neurologic:  [] Dizziness  [] Blackouts   [] Seizures   [] History of stroke   [] History of TIA  [] Aphasia   [] Temporary blindness   [] Dysphagia   [] Weakness or numbness in arms   [] Weakness or numbness in legs Musculoskeletal:  [x] Arthritis   [] Joint swelling   [] Joint pain   [x] Low back pain Hematologic:  [] Easy bruising  [] Easy bleeding   [] Hypercoagulable state   [] Anemic   Gastrointestinal:  [] Blood in stool   [] Vomiting blood  [x] Gastroesophageal reflux/heartburn   [] Abdominal pain Genitourinary:  [] Chronic kidney disease   [] Difficult urination  [] Frequent urination  [] Burning with urination   [] Hematuria Skin:  [] Rashes   [] Ulcers   [] Wounds Psychological:  [] History of anxiety   []  History of major depression.  Physical Examination  BP 132/77 (BP Location: Left Arm)   Pulse (!) 49   Resp 17   Ht 5' 8.75" (1.746 m)   Wt 207 lb 12.8 oz (94.3 kg)   BMI 30.91 kg/m  Gen:  WD/WN, NAD. Appears younger than stated age. Head: [place]/AT, No temporalis wasting. Ear/Nose/Throat: Hearing grossly intact, nares w/o erythema or drainage Eyes: Conjunctiva clear. Sclera non-icteric Neck: Supple.  Trachea midline Pulmonary:  Good air movement, no use of accessory muscles.  Cardiac: RRR, no JVD Vascular:  Vessel Right Left  Radial Palpable Palpable                          PT Palpable Palpable  DP Palpable Palpable   Gastrointestinal: soft, non-tender/non-distended. No guarding/reflex.  Musculoskeletal: M/S 5/5 throughout.  No deformity or atrophy. No edema. Neurologic: Sensation grossly intact in extremities.  Symmetrical.  Speech is fluent.  Psychiatric: Judgment intact, Mood & affect appropriate for pt's clinical situation. Dermatologic: No rashes or ulcers noted.  No cellulitis or open wounds.      Labs No results found for this or any previous visit (from the past 2160 hour(s)).  Radiology No results  found.  Assessment/Plan Hypertension blood pressure control important in reducing the progression of atherosclerotic disease and aneurysmal disease. On appropriate oral medications.   HLD (hyperlipidemia) lipid control important in reducing the progression of atherosclerotic disease. Continue statin therapy   Prediabetes blood glucose control important in reducing  the progression of atherosclerotic disease. Also, involved in wound healing. On appropriate medications.  AAA (abdominal aortic aneurysm) without rupture His duplex shows sac shrinkage down to 3.5 cm with no enoleak and a patent stent graft.  He is doing well. Follow up on an annual basis with duplex.      Festus Barren, MD  03/09/2023 9:19 AM    This note was created with Dragon medical transcription system.  Any errors from dictation are purely unintentional

## 2023-04-05 ENCOUNTER — Other Ambulatory Visit: Payer: Self-pay | Admitting: Internal Medicine

## 2023-04-23 ENCOUNTER — Ambulatory Visit (INDEPENDENT_AMBULATORY_CARE_PROVIDER_SITE_OTHER): Payer: Medicare Other | Admitting: Internal Medicine

## 2023-04-23 ENCOUNTER — Encounter: Payer: Self-pay | Admitting: Internal Medicine

## 2023-04-23 VITALS — BP 112/68 | HR 50 | Temp 97.8°F | Ht 68.75 in | Wt 206.6 lb

## 2023-04-23 DIAGNOSIS — I7143 Infrarenal abdominal aortic aneurysm, without rupture: Secondary | ICD-10-CM | POA: Diagnosis not present

## 2023-04-23 DIAGNOSIS — I700 Atherosclerosis of aorta: Secondary | ICD-10-CM | POA: Diagnosis not present

## 2023-04-23 DIAGNOSIS — R7303 Prediabetes: Secondary | ICD-10-CM

## 2023-04-23 DIAGNOSIS — G4733 Obstructive sleep apnea (adult) (pediatric): Secondary | ICD-10-CM | POA: Diagnosis not present

## 2023-04-23 DIAGNOSIS — R5383 Other fatigue: Secondary | ICD-10-CM | POA: Diagnosis not present

## 2023-04-23 DIAGNOSIS — E559 Vitamin D deficiency, unspecified: Secondary | ICD-10-CM | POA: Diagnosis not present

## 2023-04-23 DIAGNOSIS — I10 Essential (primary) hypertension: Secondary | ICD-10-CM | POA: Diagnosis not present

## 2023-04-23 DIAGNOSIS — G8929 Other chronic pain: Secondary | ICD-10-CM | POA: Diagnosis not present

## 2023-04-23 DIAGNOSIS — M810 Age-related osteoporosis without current pathological fracture: Secondary | ICD-10-CM | POA: Diagnosis not present

## 2023-04-23 DIAGNOSIS — G6289 Other specified polyneuropathies: Secondary | ICD-10-CM

## 2023-04-23 DIAGNOSIS — D126 Benign neoplasm of colon, unspecified: Secondary | ICD-10-CM

## 2023-04-23 DIAGNOSIS — M546 Pain in thoracic spine: Secondary | ICD-10-CM

## 2023-04-23 DIAGNOSIS — E781 Pure hyperglyceridemia: Secondary | ICD-10-CM

## 2023-04-23 LAB — CBC WITH DIFFERENTIAL/PLATELET
Basophils Absolute: 0 10*3/uL (ref 0.0–0.1)
Basophils Relative: 0.8 % (ref 0.0–3.0)
Eosinophils Absolute: 0.1 10*3/uL (ref 0.0–0.7)
Eosinophils Relative: 1.8 % (ref 0.0–5.0)
HCT: 43.6 % (ref 39.0–52.0)
Hemoglobin: 14.2 g/dL (ref 13.0–17.0)
Lymphocytes Relative: 16.2 % (ref 12.0–46.0)
Lymphs Abs: 0.9 10*3/uL (ref 0.7–4.0)
MCHC: 32.6 g/dL (ref 30.0–36.0)
MCV: 92.2 fl (ref 78.0–100.0)
Monocytes Absolute: 0.6 10*3/uL (ref 0.1–1.0)
Monocytes Relative: 10.6 % (ref 3.0–12.0)
Neutro Abs: 4 10*3/uL (ref 1.4–7.7)
Neutrophils Relative %: 70.6 % (ref 43.0–77.0)
Platelets: 204 10*3/uL (ref 150.0–400.0)
RBC: 4.72 Mil/uL (ref 4.22–5.81)
RDW: 16.3 % — ABNORMAL HIGH (ref 11.5–15.5)
WBC: 5.6 10*3/uL (ref 4.0–10.5)

## 2023-04-23 LAB — LDL CHOLESTEROL, DIRECT: Direct LDL: 48 mg/dL

## 2023-04-23 LAB — COMPREHENSIVE METABOLIC PANEL
ALT: 18 U/L (ref 0–53)
AST: 15 U/L (ref 0–37)
Albumin: 4.5 g/dL (ref 3.5–5.2)
Alkaline Phosphatase: 48 U/L (ref 39–117)
BUN: 22 mg/dL (ref 6–23)
CO2: 27 mEq/L (ref 19–32)
Calcium: 9.5 mg/dL (ref 8.4–10.5)
Chloride: 106 mEq/L (ref 96–112)
Creatinine, Ser: 1.12 mg/dL (ref 0.40–1.50)
GFR: 60.96 mL/min (ref >60.00)
Glucose, Bld: 93 mg/dL (ref 70–99)
Potassium: 4.8 mEq/L (ref 3.5–5.1)
Sodium: 140 mEq/L (ref 135–145)
Total Bilirubin: 1.7 mg/dL — ABNORMAL HIGH (ref 0.2–1.2)
Total Protein: 6.6 g/dL (ref 6.0–8.3)

## 2023-04-23 LAB — LIPID PANEL
Cholesterol: 109 mg/dL (ref 0–200)
HDL: 52.5 mg/dL (ref >39.00)
LDL Cholesterol: 42 mg/dL (ref 0–99)
NonHDL: 56.16
Total CHOL/HDL Ratio: 2
Triglycerides: 69 mg/dL (ref 0.0–149.0)
VLDL: 13.8 mg/dL (ref 0.0–40.0)

## 2023-04-23 LAB — HEMOGLOBIN A1C: Hgb A1c MFr Bld: 6 % (ref 4.6–6.5)

## 2023-04-23 LAB — VITAMIN D 25 HYDROXY (VIT D DEFICIENCY, FRACTURES): VITD: 42.85 ng/mL (ref 30.00–100.00)

## 2023-04-23 LAB — TSH: TSH: 2.91 u[IU]/mL (ref 0.35–5.50)

## 2023-04-23 NOTE — Assessment & Plan Note (Signed)
Not progressive.  Loss of sensation .  Not limiting activity. Loss osf senstion to heat and cold discussed

## 2023-04-23 NOTE — Assessment & Plan Note (Addendum)
2 TAs were removed during y 2018 colonoscopy  .  He was advised to repeat in 5 yrs.

## 2023-04-23 NOTE — Assessment & Plan Note (Signed)
Managed with CPAP with persistent respiratory variation with 10 to 20 events per hour.  Advised to follow up with pulmonology

## 2023-04-23 NOTE — Progress Notes (Signed)
Patient ID: Jamie Zhang, male    DOB: 07/10/1941  Age: 82 y.o. MRN: [id number]  The patient is here for  follow up and  management of other chronic and acute problems.   The risk factors are reflected in the social history.  The roster of all physicians providing medical care to patient - is listed in the Snapshot section of the chart.  Activities of daily living:  The patient is 100% independent in all ADLs: dressing, toileting, feeding as well as independent mobility  Home safety : The patient has smoke detectors in the home. They wear seatbelts.  There are no firearms at home. There is no violence in the home.   There is no risks for hepatitis, STDs or HIV. There is no   history of blood transfusion. They have no travel history to infectious disease endemic areas of the world.  The patient has seen their dentist in the last six month. They have seen their eye doctor in the last year. They admit to slight hearing difficulty with regard to whispered voices and some television programs.  They have deferred audiologic testing in the last year.  They do not  have excessive sun exposure. Discussed the need for sun protection: hats, long sleeves and use of sunscreen if there is significant sun exposure.   Diet: the importance of a healthy diet is discussed. They do have a healthy diet.  The benefits of regular aerobic exercise were discussed. She walks 4 times per week ,  20 minutes.   Depression screen: there are no signs or vegative symptoms of depression- irritability, change in appetite, anhedonia, sadness/tearfullness.  Cognitive assessment: the patient manages all their financial and personal affairs and is actively engaged. They could relate day,date,year and events; recalled 2/3 objects at 3 minutes; performed clock-face test normally.  The following portions of the patient's history were reviewed and updated as appropriate: allergies, current medications, past family history, past medical  history,  past surgical history, past social history  and problem list.  Visual acuity was not assessed per patient preference since she has regular follow up with her ophthalmologist. Hearing and body mass index were assessed and reviewed.   During the course of the visit the patient was educated and counseled about appropriate screening and preventive services including : fall prevention , diabetes screening, nutrition counseling, colorectal cancer screening, and recommended immunizations.    CC: The primary encounter diagnosis was Primary hypertension. Diagnoses of Pure hypertriglyceridemia, Prediabetes, Other fatigue, Infrarenal abdominal aortic aneurysm (AAA) without rupture (HCC), Other polyneuropathy, Osteoporosis of vertebra, Vitamin D deficiency, Atherosclerosis of aorta without gangrene (HCC), OSA (obstructive sleep apnea), Chronic midline thoracic back pain, and Tubular adenoma of colon were also pertinent to this visit.   1) OSA:  AHI has increased recently based on reports from CPAP sensor.  Thinks it may be due to elevating the head of bed.  Whic prevents him from side sleeping.  AHI  Improves with side sleeping .   2) HTN:  BP at home 120/70 to 130/80  .  He is  walking on treadmill 3.4 miles per hours for 30 minutes,  has reduced to 25 minutes due to fatigue and peripheral neuropathy  4) worried about Nan's cognitive decline. Misplacing things,  forgetting events,  trouble learning new tasks.  No longer beating him at online jigsaw puzzles.   Lives at [facility].    History Yani has a past medical history of Arthritis, Cataract (01/02/2011), Colon polyps (2000), GERD (  gastroesophageal reflux disease), Hemorrhoids (2006), HLD (hyperlipidemia), Iron deficiency anemia (03/20/2006), Migraine, Osteopenia, RLS (restless legs syndrome) (03/20/2006), Schatzki's ring, Skin cancer (01/04/2012), and Sleep apnea.   He has a past surgical history that includes Cataract Surgery (Bilateral); Rotator cuff  repair (Left); Breast biopsy (Left); Inguinal hernia repair (Left); Colonoscopy with propofol (N/A, 04/18/2017); and ENDOVASCULAR REPAIR/STENT GRAFT (N/A, 11/03/2020).   His family history includes Alcohol abuse in his father, mother, and sister; Arthritis in his mother; Cancer (age of onset: 24) in his sister; Cancer (age of onset: 82) in his father; Hypertension in his mother.He reports that he quit smoking about 44 years ago. His smoking use included cigarettes. He has a 42.00 pack-year smoking history. He has never used smokeless tobacco. He reports current alcohol use. He reports that he does not use drugs.  Outpatient Medications Prior to Visit  Medication Sig Dispense Refill   acetaminophen (TYLENOL) 325 MG tablet Take 650 mg by mouth every 6 (six) hours as needed.     aspirin EC 81 MG tablet Take 81 mg by mouth daily.     atorvastatin (LIPITOR) 20 MG tablet TAKE 1 TABLET(20 MG) BY MOUTH DAILY 90 tablet 3   Calcium Citrate-Vitamin D (CITRACAL + D PO) Take 1 tablet by mouth daily.     Cholecalciferol (VITAMIN D3) 10000 UNITS TABS Take 10,000 Units by mouth daily.     docusate sodium (COLACE) 100 MG capsule Take 100 mg by mouth every other day.     famotidine (PEPCID) 40 MG tablet Take 1 tablet by mouth daily.     fluticasone (FLONASE) 50 MCG/ACT nasal spray SHAKE LIQUID AND USE 2 SPRAYS IN EACH NOSTRIL DAILY 48 g 1   losartan (COZAAR) 50 MG tablet TAKE 1 TABLET(50 MG) BY MOUTH DAILY 90 tablet 1   Facility-Administered Medications Prior to Visit  Medication Dose Route Frequency Provider Last Rate Last Admin   dexamethasone (DECADRON) injection 4 mg  4 mg Other Once Edwin Cap, DPM       triamcinolone acetonide (KENALOG) 10 MG/ML injection 10 mg  10 mg Other Once Edwin Cap, DPM        Review of Systems  Patient denies headache, fevers, malaise, unintentional weight loss, skin rash, eye pain, sinus congestion and sinus pain, sore throat, dysphagia,  hemoptysis , cough, dyspnea,  wheezing, chest pain, palpitations, orthopnea, edema, abdominal pain, nausea, melena, diarrhea, constipation, flank pain, dysuria, hematuria, urinary  Frequency, nocturia, numbness, tingling, seizures,  Focal weakness, Loss of consciousness,  Tremor, insomnia, depression, anxiety, and suicidal ideation.     Objective:  BP 112/68   Pulse (!) 50   Temp 97.8 F (36.6 C) (Oral)   Ht 5' 8.75" (1.746 m)   Wt 206 lb 9.6 oz (93.7 kg)   SpO2 96%   BMI 30.73 kg/m   Physical Exam Vitals reviewed.  Constitutional:      General: He is not in acute distress.    Appearance: Normal appearance. He is normal weight. He is not ill-appearing, toxic-appearing or diaphoretic.  HENT:     Head: Normocephalic and atraumatic.     Right Ear: Tympanic membrane, ear canal and external ear normal. There is no impacted cerumen.     Left Ear: Tympanic membrane, ear canal and external ear normal. There is no impacted cerumen.     Nose: Nose normal.     Mouth/Throat:     Mouth: Mucous membranes are moist.     Pharynx: Oropharynx is clear.  Eyes:  General: No scleral icterus.       Right eye: No discharge.        Left eye: No discharge.     Conjunctiva/sclera: Conjunctivae normal.  Neck:     Thyroid: No thyromegaly.     Vascular: No carotid bruit or JVD.  Cardiovascular:     Rate and Rhythm: Normal rate and regular rhythm.     Heart sounds: Normal heart sounds.  Pulmonary:     Effort: Pulmonary effort is normal. No respiratory distress.     Breath sounds: Normal breath sounds.  Abdominal:     General: Bowel sounds are normal.     Palpations: Abdomen is soft. There is no mass.     Tenderness: There is no abdominal tenderness. There is no guarding or rebound.  Musculoskeletal:        General: Normal range of motion.     Cervical back: Normal range of motion and neck supple.  Lymphadenopathy:     Cervical: No cervical adenopathy.  Skin:    General: Skin is warm and dry.  Neurological:     General:  No focal deficit present.     Mental Status: He is alert and oriented to person, place, and time. Mental status is at baseline.  Psychiatric:        Mood and Affect: Mood normal.        Behavior: Behavior normal.        Thought Content: Thought content normal.        Judgment: Judgment normal.    Assessment & Plan:  Primary hypertension Assessment & Plan: Improved control on current regimen.   Goal is 120/70.   No changes today.  Lab Results  Component Value Date   CREATININE 1.12 04/23/2023   Lab Results  Component Value Date   NA 140 04/23/2023   K 4.8 04/23/2023   CL 106 04/23/2023   CO2 27 04/23/2023       Orders: -     Comprehensive metabolic panel  Pure hypertriglyceridemia -     Lipid panel -     LDL cholesterol, direct  Prediabetes -     Comprehensive metabolic panel -     Hemoglobin A1c  Other fatigue -     CBC with Differential/Platelet -     TSH  Infrarenal abdominal aortic aneurysm (AAA) without rupture [facility]) Assessment & Plan: Annual follow up with Dr Wyn Quaker was done  in April.  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    Other polyneuropathy Assessment & Plan: Not progressive.  Loss of sensation .  Not limiting activity. Loss osf senstion to heat and cold discussed    Osteoporosis of vertebra Assessment & Plan: T scores are in the norma of mildly osteopenic range.  He has had a Slight decrease in hip density by 2023 DEXA ,  After stopping alendronate  several years ago. [name] HE DENIES ANY HISTORY OF VERTEBRAL FRACTURES, BUT HIS 2011 OFFICE NOTE FROM the osteoporosis clinic notes mild to moderate wedge deformities from T7 to L1  .     Recent thoracic /lumbar spine films are negative for fractures.    Vitamin D deficiency -     VITAMIN D 25 Hydroxy (Vit-D Deficiency, Fractures)  Atherosclerosis of aorta without gangrene (HCC) Assessment & Plan: Reviewed findings of prior CT scan today..  Patient is tolerating high potency statin therapy   Lab Results   Component Value Date   CHOL 109 04/23/2023   HDL 52.50 04/23/2023  LDLCALC 42 04/23/2023   LDLDIRECT 48.0 04/23/2023   TRIG 69.0 04/23/2023   CHOLHDL 2 04/23/2023      OSA (obstructive sleep apnea) Assessment & Plan: Managed with CPAP with persistent respiratory variation with 10 to 20 events per hour.  Advised to follow up with pulmonology   Chronic midline thoracic back pain Assessment & Plan: Plain films done and no fractures or alignment issues.  Anterior vertebral endplate spurring noted. Continue tylenol,   Tubular adenoma of colon Assessment & Plan: 2 TAs were removed during y 2018 colonoscopy  .  He was advised to repeat in 5 yrs.   Orders: -     Ambulatory referral to Gastroenterology      I provided 40 minutes of  face-to-face time during this encounter reviewing patient's current problems and past surgeries,  recent labs and imaging studies, providing counseling on the above mentioned problems , and coordination  of care .   Follow-up: Return in about 6 months (around 10/23/2023).   Sherlene Shams, MD

## 2023-04-23 NOTE — Assessment & Plan Note (Signed)
Plain films done and no fractures or alignment issues.  Anterior vertebral endplate spurring noted. Continue tylenol,

## 2023-04-23 NOTE — Assessment & Plan Note (Signed)
Improved control on current regimen.   Goal is 120/70.   No changes today.  Lab Results  Component Value Date   CREATININE 1.12 04/23/2023   Lab Results  Component Value Date   NA 140 04/23/2023   K 4.8 04/23/2023   CL 106 04/23/2023   CO2 27 04/23/2023

## 2023-04-23 NOTE — Assessment & Plan Note (Addendum)
T scores are in the norma of mildly osteopenic range.  He has had a Slight decrease in hip density by 2023 DEXA ,  After stopping alendronate  several years ago. [name] HE DENIES ANY HISTORY OF VERTEBRAL FRACTURES, BUT HIS 2011 OFFICE NOTE FROM the osteoporosis clinic notes mild to moderate wedge deformities from T7 to L1  .     Recent thoracic /lumbar spine films are negative for fractures.

## 2023-04-23 NOTE — Assessment & Plan Note (Addendum)
Annual follow up with Dr Wyn Quaker was done  in April.  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

## 2023-04-23 NOTE — Assessment & Plan Note (Signed)
Reviewed findings of prior CT scan today..  Patient is tolerating high potency statin therapy   Lab Results  Component Value Date   CHOL 109 04/23/2023   HDL 52.50 04/23/2023   LDLCALC 42 04/23/2023   LDLDIRECT 48.0 04/23/2023   TRIG 69.0 04/23/2023   CHOLHDL 2 04/23/2023

## 2023-04-23 NOTE — Patient Instructions (Addendum)
Do not resume alendronate,  we will continue  screening every 2-3 years and of  T scores  get below -2.5  we will try to get Prolia     I recommend getting the majority of your calcium and Vitamin D  through diet rather than supplements given the recent association of calcium supplements with increased coronary artery calcium scores.  You need 1200 mg calcium daily and 1000 IUs of Vit D (more if your level is low today) if you have osteopenia  Try using Tums,  Protein drinks (like Ensure,  Premier Protein, or Muscle Milk), soy,  almond and cashew milks which are fortified with calcium  and carried by  most grocery stores   in the dairy  Section.   They are lactose free.    Please ask Elana Alm to schedule an appt to discuss her memory issues  Work on [employer]

## 2023-04-24 ENCOUNTER — Encounter: Payer: Self-pay | Admitting: Internal Medicine

## 2023-05-01 ENCOUNTER — Other Ambulatory Visit: Payer: Self-pay | Admitting: Internal Medicine

## 2023-05-09 DIAGNOSIS — D3132 Benign neoplasm of left choroid: Secondary | ICD-10-CM | POA: Diagnosis not present

## 2023-05-09 DIAGNOSIS — H43813 Vitreous degeneration, bilateral: Secondary | ICD-10-CM | POA: Diagnosis not present

## 2023-05-09 DIAGNOSIS — Z961 Presence of intraocular lens: Secondary | ICD-10-CM | POA: Diagnosis not present

## 2023-05-09 DIAGNOSIS — H26491 Other secondary cataract, right eye: Secondary | ICD-10-CM | POA: Diagnosis not present

## 2023-05-15 DIAGNOSIS — D2271 Melanocytic nevi of right lower limb, including hip: Secondary | ICD-10-CM | POA: Diagnosis not present

## 2023-05-15 DIAGNOSIS — X32XXXA Exposure to sunlight, initial encounter: Secondary | ICD-10-CM | POA: Diagnosis not present

## 2023-05-15 DIAGNOSIS — Z85828 Personal history of other malignant neoplasm of skin: Secondary | ICD-10-CM | POA: Diagnosis not present

## 2023-05-15 DIAGNOSIS — B078 Other viral warts: Secondary | ICD-10-CM | POA: Diagnosis not present

## 2023-05-15 DIAGNOSIS — L538 Other specified erythematous conditions: Secondary | ICD-10-CM | POA: Diagnosis not present

## 2023-05-15 DIAGNOSIS — D2262 Melanocytic nevi of left upper limb, including shoulder: Secondary | ICD-10-CM | POA: Diagnosis not present

## 2023-05-15 DIAGNOSIS — D2261 Melanocytic nevi of right upper limb, including shoulder: Secondary | ICD-10-CM | POA: Diagnosis not present

## 2023-05-15 DIAGNOSIS — L570 Actinic keratosis: Secondary | ICD-10-CM | POA: Diagnosis not present

## 2023-05-15 DIAGNOSIS — D225 Melanocytic nevi of trunk: Secondary | ICD-10-CM | POA: Diagnosis not present

## 2023-08-15 DIAGNOSIS — Z23 Encounter for immunization: Secondary | ICD-10-CM | POA: Diagnosis not present

## 2023-10-24 ENCOUNTER — Encounter: Payer: Self-pay | Admitting: Internal Medicine

## 2023-10-24 ENCOUNTER — Other Ambulatory Visit: Payer: Self-pay | Admitting: Internal Medicine

## 2023-10-24 ENCOUNTER — Ambulatory Visit: Payer: Medicare Other | Admitting: Internal Medicine

## 2023-10-24 VITALS — BP 120/70 | HR 60 | Temp 98.0°F | Resp 16 | Ht 69.0 in | Wt 208.0 lb

## 2023-10-24 DIAGNOSIS — E781 Pure hyperglyceridemia: Secondary | ICD-10-CM

## 2023-10-24 DIAGNOSIS — R7303 Prediabetes: Secondary | ICD-10-CM

## 2023-10-24 DIAGNOSIS — I10 Essential (primary) hypertension: Secondary | ICD-10-CM

## 2023-10-24 DIAGNOSIS — Z636 Dependent relative needing care at home: Secondary | ICD-10-CM

## 2023-10-24 DIAGNOSIS — I7143 Infrarenal abdominal aortic aneurysm, without rupture: Secondary | ICD-10-CM | POA: Diagnosis not present

## 2023-10-24 DIAGNOSIS — E663 Overweight: Secondary | ICD-10-CM | POA: Diagnosis not present

## 2023-10-24 DIAGNOSIS — I700 Atherosclerosis of aorta: Secondary | ICD-10-CM | POA: Diagnosis not present

## 2023-10-24 LAB — CBC WITH DIFFERENTIAL/PLATELET
Basophils Absolute: 0 10*3/uL (ref 0.0–0.1)
Basophils Relative: 0.6 % (ref 0.0–3.0)
Eosinophils Absolute: 0.1 10*3/uL (ref 0.0–0.7)
Eosinophils Relative: 1.5 % (ref 0.0–5.0)
HCT: 44.6 % (ref 39.0–52.0)
Hemoglobin: 15 g/dL (ref 13.0–17.0)
Lymphocytes Relative: 15.3 % (ref 12.0–46.0)
Lymphs Abs: 0.9 10*3/uL (ref 0.7–4.0)
MCHC: 33.6 g/dL (ref 30.0–36.0)
MCV: 96.1 fL (ref 78.0–100.0)
Monocytes Absolute: 0.5 10*3/uL (ref 0.1–1.0)
Monocytes Relative: 8.4 % (ref 3.0–12.0)
Neutro Abs: 4.6 10*3/uL (ref 1.4–7.7)
Neutrophils Relative %: 74.2 % (ref 43.0–77.0)
Platelets: 187 10*3/uL (ref 150.0–400.0)
RBC: 4.64 Mil/uL (ref 4.22–5.81)
RDW: 14.4 % (ref 11.5–15.5)
WBC: 6.1 10*3/uL (ref 4.0–10.5)

## 2023-10-24 LAB — LIPID PANEL
Cholesterol: 117 mg/dL (ref 0–200)
HDL: 55.6 mg/dL (ref >39.00)
LDL Cholesterol: 49 mg/dL (ref 0–99)
NonHDL: 60.97
Total CHOL/HDL Ratio: 2
Triglycerides: 60 mg/dL (ref 0.0–149.0)
VLDL: 12 mg/dL (ref 0.0–40.0)

## 2023-10-24 LAB — COMPREHENSIVE METABOLIC PANEL
ALT: 24 U/L (ref 0–53)
AST: 18 U/L (ref 0–37)
Albumin: 4.6 g/dL (ref 3.5–5.2)
Alkaline Phosphatase: 60 U/L (ref 39–117)
BUN: 26 mg/dL — ABNORMAL HIGH (ref 6–23)
CO2: 28 meq/L (ref 19–32)
Calcium: 9.4 mg/dL (ref 8.4–10.5)
Chloride: 108 meq/L (ref 96–112)
Creatinine, Ser: 1.05 mg/dL (ref 0.40–1.50)
GFR: 65.64 mL/min (ref >60.00)
Glucose, Bld: 96 mg/dL (ref 70–99)
Potassium: 4.4 meq/L (ref 3.5–5.1)
Sodium: 143 meq/L (ref 135–145)
Total Bilirubin: 1.5 mg/dL — ABNORMAL HIGH (ref 0.2–1.2)
Total Protein: 6.7 g/dL (ref 6.0–8.3)

## 2023-10-24 LAB — MICROALBUMIN / CREATININE URINE RATIO
Creatinine,U: 136.1 mg/dL
Microalb Creat Ratio: 0.8 mg/g (ref 0.0–30.0)
Microalb, Ur: 1 mg/dL (ref 0.0–1.9)

## 2023-10-24 LAB — LDL CHOLESTEROL, DIRECT: Direct LDL: 48 mg/dL

## 2023-10-24 LAB — TSH: TSH: 2.73 u[IU]/mL (ref 0.35–5.50)

## 2023-10-24 LAB — HEMOGLOBIN A1C: Hgb A1c MFr Bld: 5.9 % (ref 4.6–6.5)

## 2023-10-24 MED ORDER — LOSARTAN POTASSIUM 50 MG PO TABS: 50.0000 mg | ORAL_TABLET | Freq: Every day | ORAL | 1 refills | Status: DC

## 2023-10-24 NOTE — Progress Notes (Signed)
Subjective:  Patient ID: Jamie Zhang, male    DOB: [birth year]  Age: 82 y.o. MRN: [id number]  CC: The primary encounter diagnosis was Primary hypertension. 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   HPI Jamie Zhang presents for  Chief Complaint  Patient presents with   Medical Management of Chronic Issues   1) AORTIC ATHEROSCLEROSIS:  Reviewed findings of prior CT scan today..  Patient is willing to  Initiate statin therpay starting with 20 mg crestor once daily and advance as tolerated to twice weekly.     2)  Hypertension: patient checks blood pressure twice weekly at home.  Readings have been for the most part <130/80 at rest . Patient is following a reduced salt diet most days and is taking medications as prescribed   3) CKD STAGE  3:   no longer seeing nephrology bc GFR is stable .  No diet soda's   4) AORTIC ANEURYSM : ](ABDOMINAL)  LAST ULTRASOUDN WAS APRIL 2024 .  DIAMETER OF AAA HAD DECREASED   5) STRESS, EMOTIONAL :  wife's COGNITIVE DECLINE.  [name]  Feels he is losing his wife,  but gets impatient with her    Outpatient Medications Prior to Visit  Medication Sig Dispense Refill   acetaminophen (TYLENOL) 325 MG tablet Take 650 mg by mouth every 6 (six) hours as needed.     aspirin EC 81 MG tablet Take 81 mg by mouth daily.     atorvastatin (LIPITOR) 20 MG tablet TAKE 1 TABLET(20 MG) BY MOUTH DAILY 90 tablet 3   Calcium Citrate-Vitamin D (CITRACAL + D PO) Take 1 tablet by mouth daily.     Cholecalciferol (VITAMIN D3) 10000 UNITS TABS Take 10,000 Units by mouth daily.     docusate sodium (COLACE) 100 MG capsule Take 100 mg by mouth every other day.     famotidine (PEPCID) 40 MG tablet Take 1 tablet by mouth daily.     fluticasone (FLONASE) 50 MCG/ACT nasal spray SHAKE  LIQUID AND USE 2 SPRAYS IN EACH NOSTRIL DAILY 48 g 1   losartan (COZAAR) 50 MG tablet TAKE 1 TABLET(50 MG) BY MOUTH DAILY 90 tablet 1   Facility-Administered Medications Prior to Visit  Medication Dose Route Frequency Provider Last Rate Last Admin   dexamethasone (DECADRON) injection 4 mg  4 mg Other Once Edwin Cap, DPM       triamcinolone acetonide (KENALOG) 10 MG/ML injection 10 mg  10 mg Other Once Edwin Cap, DPM        Review of Systems;  Patient denies headache, fevers, malaise, unintentional weight loss, skin rash, eye pain, sinus congestion and sinus pain, sore throat, dysphagia,  hemoptysis , cough, dyspnea, wheezing, chest pain, palpitations, orthopnea, edema, abdominal pain, nausea, melena, diarrhea, constipation, flank pain, dysuria, hematuria, urinary  Frequency, nocturia, numbness, tingling, seizures,  Focal weakness, Loss of consciousness,  Tremor, insomnia, depression, anxiety, and suicidal ideation.      Objective:  BP 120/70   Pulse 60   Temp 98 F (36.7 C)   Resp 16   Ht 5\' 9"  (1.753 m)   Wt 208 lb (94.3 kg)   SpO2 98%   BMI 30.72 kg/m   BP Readings from Last 3 Encounters:  10/24/23 120/70  04/23/23 112/68  03/09/23 132/77    Wt Readings from Last 3 Encounters:  10/24/23 208 lb (94.3 kg)  04/23/23 206 lb 9.6 oz (93.7 kg)  03/09/23 207 lb 12.8 oz (94.3 kg)    Physical Exam Vitals reviewed.  Constitutional:      General: He is not in acute distress.    Appearance: Normal appearance. He is normal weight. He is not ill-appearing, toxic-appearing or diaphoretic.  HENT:     Head: Normocephalic.  Eyes:     General: No scleral icterus.       Right eye: No discharge.        Left eye: No discharge.     Conjunctiva/sclera: Conjunctivae normal.  Cardiovascular:     Rate and Rhythm: Normal rate and regular rhythm.     Heart sounds: Normal heart sounds.  Pulmonary:     Effort: Pulmonary effort is normal. No respiratory distress.     Breath  sounds: Normal breath sounds.  Musculoskeletal:        General: Normal range of motion.     Cervical back: Normal range of motion.  Skin:    General: Skin is warm and dry.  Neurological:     General: No focal deficit present.     Mental Status: He is alert and oriented to person, place, and time. Mental status is at baseline.  Psychiatric:        Mood and Affect: Affect is tearful.        Behavior: Behavior normal.        Thought Content: Thought content normal.        Judgment: Judgment normal.     Comments: sad    Lab Results  Component Value Date   HGBA1C 5.9 10/24/2023   HGBA1C 6.0 04/23/2023   HGBA1C 6.0 08/25/2021    Lab Results  Component Value Date   CREATININE 1.05 10/24/2023   CREATININE 1.12 04/23/2023   CREATININE 1.09 04/17/2022    Lab Results  Component Value Date   WBC 6.1 10/24/2023   HGB 15.0 10/24/2023   HCT 44.6 10/24/2023   PLT 187.0 10/24/2023   GLUCOSE 96 10/24/2023   CHOL 117 10/24/2023   TRIG 60.0 10/24/2023   HDL 55.60 10/24/2023   LDLDIRECT 48.0 10/24/2023   LDLCALC 49 10/24/2023   ALT 24 10/24/2023   AST 18 10/24/2023   NA 143 10/24/2023   K 4.4 10/24/2023   CL 108 10/24/2023   CREATININE 1.05 10/24/2023   BUN 26 (H) 10/24/2023   CO2 28 10/24/2023   TSH 2.73 10/24/2023   PSA 1.19 08/08/2019   INR 1.1 11/01/2020   HGBA1C 5.9 10/24/2023   MICROALBUR 1.0 10/24/2023    DG Bone Density  Result Date: 06/20/2022 EXAM: DUAL X-RAY ABSORPTIOMETRY (DXA) FOR BONE MINERAL DENSITY IMPRESSION: Your patient Jamie Zhang completed a BMD test on 06/20/2022 using the [employer] DXA System (software version: 14.10) manufactured by [employer]. The following summarizes the results of our evaluation. Technologist: SCE PATIENT BIOGRAPHICAL: Name: Jamie Zhang, Jamie Zhang Patient ID: [id number] Birth Date: 21-Sep-1941 Height: 68.5 in. Gender: Male Exam Date: 06/20/2022 Weight: 200.7 lbs. Indications: Advanced Age, Caucasian, Height Loss Fractures:  Treatments: Calcium, Vitamin D DENSITOMETRY RESULTS: Site         Region     Measured Date Measured Age WHO Classification Young Adult T-score BMD         %Change vs. Previous Significant Change (*) DualFemur Neck Right 06/20/2021 82.1 Osteopenia -1.6 0.857 g/cm2 -4.5% - DualFemur Neck Right 05/02/2018 78.0 Osteopenia -1.3 0.897 g/cm2 -1.4% - DualFemur Neck Right  06/16/2015 75.1 Osteopenia -1.2 0.910 g/cm2 - - DualFemur Total Mean 06/20/2022 82.1 Osteopenia -1.2 0.928 g/cm2 -2.6% Yes DualFemur Total Mean 05/02/2018 78.0 Normal -1.0 0.953 g/cm2 -0.6% - DualFemur Total Mean 06/16/2015 75.1 Normal -1.0 0.959 g/cm2 - - Left Forearm Radius 33% 06/20/2022 82.1 Normal -0.5 0.944 g/cm2 -0.1% - Left Forearm Radius 33% 05/02/2018 78.0 Normal -0.4 0.945 g/cm2 1.2% - Left Forearm Radius 33% 06/16/2015 75.1 Normal -0.6 0.934 g/cm2 - - ASSESSMENT: The BMD measured at Femur Neck Right is 0.857 g/cm2 with a T-score of -1.6. This patient is considered osteopenic according to the World Health Organization [facility]) criteria. The scan quality is good. Lumbar spine was not utilized due to advanced degenerative changes. Compared with prior study, there has been a significant decrease in the total hip. World Science writer [facility]) criteria for post-menopausal, Caucasian Women: Normal:                   T-score at or above -1 SD Osteopenia/low bone mass: T-score between -1 and -2.5 SD Osteoporosis:             T-score at or below -2.5 SD RECOMMENDATIONS: 1. All patients should optimize calcium and vitamin D intake. 2. Consider FDA-approved medical therapies in postmenopausal women and men aged 74 years and older, based on the following: a. A hip or vertebral(clinical or morphometric) fracture b. T-score < -2.5 at the femoral neck or spine after appropriate evaluation to exclude secondary causes c. Low bone mass (T-score between -1.0 and -2.5 at the femoral neck or spine) and a 10-year probability of a hip fracture > 3% or a 10-year  probability of a major osteoporosis-related fracture > 20% based on the [place]-adapted WHO algorithm 3. Clinician judgment and/or patient preferences may indicate treatment for people with 10-year fracture probabilities above or below these levels FOLLOW-UP: People with diagnosed cases of osteoporosis or osteopenia should be regularly tested for bone mineral density. For patients eligible for Medicare, routine testing is allowed once every 2 years. The testing frequency can be increased to one year for patients who have rapidly progressing disease, or for those who are receiving medical therapy to restore bone mass. I have reviewed this report, and agree with the above findings. [facility] Dear Dr Darrick Huntsman, Your patient Jamie Zhang completed a FRAX assessment on 06/20/2022 using the [employer] iDXA DXA System (analysis version: 14.10) manufactured by [employer]. The following summarizes the results of our evaluation. PATIENT BIOGRAPHICAL: Name: Jamie Zhang, Jamie Zhang Patient ID: [id number] Birth Date: May 14, 1941 Height:    68.5 in. Gender:     [age]      [age]       Weight:    200.7 lbs. Ethnicity:  White                            Exam Date: 06/20/2022 FRAX* RESULTS:  (version: 3.5) 10-year Probability of Fracture1 Major Osteoporotic Fracture2 Hip Fracture 7.6% 2.9% Population: [place] (Caucasian) Risk Factors: None Based on Femur (Right) Neck BMD 1 -The 10-year probability of fracture may be lower than reported if the patient has received treatment. 2 -Major Osteoporotic Fracture: Clinical Spine, Forearm, Hip or Shoulder *FRAX is a Armed forces logistics/support/administrative officer of the [employer] of [employer] for Metabolic Bone Disease, a World Science writer (WHO) [employer]. ASSESSMENT: The probability of a major osteoporotic fracture is 7.6% within the next ten years. The probability  of a hip fracture is 2.9% within the next ten years. . Electronically Signed   By: Frederico Hamman M.D.   On: 06/20/2022  09:40    Assessment & Plan:  .Primary hypertension Assessment & Plan: Improved control on llosartan 50 mg daily .  Goal is 120/70.   No changes today.  Lab Results  Component Value Date   CREATININE 1.05 10/24/2023   Lab Results  Component Value Date   NA 143 10/24/2023   K 4.4 10/24/2023   CL 108 10/24/2023   CO2 28 10/24/2023       Orders: -     Comprehensive metabolic panel -     Microalbumin / creatinine urine ratio  Overweight (BMI 25.0-29.9) -     CBC with Differential/Platelet  Prediabetes -     Hemoglobin A1c -     Comprehensive metabolic panel  Pure hypertriglyceridemia -     TSH -     Lipid panel -     LDL cholesterol, direct  Caregiver stress Assessment & Plan: Counselling given advised to request neurologic consult from wife;s PCP    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  Patient is tolerating high potency statin therapy   Lab Results  Component Value Date   CHOL 117 10/24/2023   HDL 55.60 10/24/2023   LDLCALC 49 10/24/2023   LDLDIRECT 48.0 10/24/2023   TRIG 60.0 10/24/2023   CHOLHDL 2 10/24/2023      Infrarenal abdominal aortic aneurysm (AAA) without rupture [facility]) Assessment & Plan: Annual follow up with Dr Wyn Quaker was done  in April.  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 Diameter is decreasing    Other orders -     Losartan Potassium; Take 1 tablet (50 mg total) by mouth daily.  Dispense: 90 tablet; Refill: 1     I provided 30 minutes of face-to-face time during this encounter reviewing patient's last visit with me, patient's  most recent visit with cardiology,  vascular surgery ,  recent surgical and non surgical procedures, previous  labs and imaging studies, counseling on currently addressed issues,  and post visit ordering to diagnostics and therapeutics .   Follow-up: No follow-ups on file.   Sherlene Shams, MD

## 2023-10-24 NOTE — Patient Instructions (Addendum)
  You might want to try using Relaxium for insomnia  (as seen on TV commercials) . It is available through [employer] and contains all natural supplements.  Take it at bedtime   Melatonin 5 mg  Chamomile 25 mg Passionflower extract 75 mg GABA 100 mg Ashwaganda extract 125 mg Magnesium citrate, glycinate, oxide (100 mg)  L tryptophan 500 mg Valerest (proprietary  ingredient ; probably valeria root extract)    IF YOU DON'T FIND THIS EFFECTIVE OR TOLERABLE.    CALL TO ASK FOR RX FOR TRAZODONE     You can take up to 2000 mg of acetominophen (tylenol) every day safely  In divided doses (500 mg every 6 hours  Or 1000 mg every 12 hours.)

## 2023-10-24 NOTE — Assessment & Plan Note (Addendum)
Improved control on llosartan 50 mg daily .  Goal is 120/70.   No changes today.  Lab Results  Component Value Date   CREATININE 1.05 10/24/2023   Lab Results  Component Value Date   NA 143 10/24/2023   K 4.4 10/24/2023   CL 108 10/24/2023   CO2 28 10/24/2023

## 2023-10-24 NOTE — Assessment & Plan Note (Signed)
Annual follow up with Dr Wyn Quaker was done  in April.  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 Diameter is decreasing

## 2023-10-24 NOTE — Assessment & Plan Note (Addendum)
Counselling given advised to request neurologic consult from wife;s PCP

## 2023-10-24 NOTE — Assessment & Plan Note (Signed)
Reviewed findings of prior CT scan today..  Patient is tolerating high potency statin therapy   Lab Results  Component Value Date   CHOL 117 10/24/2023   HDL 55.60 10/24/2023   LDLCALC 49 10/24/2023   LDLDIRECT 48.0 10/24/2023   TRIG 60.0 10/24/2023   CHOLHDL 2 10/24/2023

## 2024-01-15 DIAGNOSIS — G4733 Obstructive sleep apnea (adult) (pediatric): Secondary | ICD-10-CM | POA: Diagnosis not present

## 2024-01-19 ENCOUNTER — Other Ambulatory Visit: Payer: Self-pay | Admitting: Internal Medicine

## 2024-01-21 ENCOUNTER — Ambulatory Visit (INDEPENDENT_AMBULATORY_CARE_PROVIDER_SITE_OTHER): Payer: Medicare Other | Admitting: *Deleted

## 2024-01-21 VITALS — BP 132/72 | Ht 68.75 in | Wt 203.0 lb

## 2024-01-21 DIAGNOSIS — Z0000 Encounter for general adult medical examination without abnormal findings: Secondary | ICD-10-CM | POA: Diagnosis not present

## 2024-01-21 NOTE — Patient Instructions (Signed)
 Jamie Zhang , Thank you for taking time to come for your Medicare Wellness Visit. I appreciate your ongoing commitment to your health goals. Please review the following plan we discussed and let me know if I can assist you in the future.   Referrals/Orders/Follow-Ups/Clinician Recommendations: None  This is a list of the screening recommended for you and due dates:  Health Maintenance  Topic Date Due   COVID-19 Vaccine (6 - 2024-25 season) 07/15/2023   Medicare Annual Wellness Visit  01/20/2025   Colon Cancer Screening  01/18/2026   DTaP/Tdap/Td vaccine (3 - Td or Tdap) 04/18/2032   Pneumonia Vaccine  Completed   Flu Shot  Completed   Zoster (Shingles) Vaccine  Completed   HPV Vaccine  Aged Out    Advanced directives: (Copy Requested) Please bring a copy of your health care power of attorney and living will to the office to be added to your chart at your convenience. You can mail to [facility] [address] [place], [place] [address] or email to [email or web address]  Next Medicare Annual Wellness Visit scheduled for next year: Yes 01/23/25 @ 10:50

## 2024-01-21 NOTE — Progress Notes (Signed)
 Subjective:   Jamie Zhang is a 83 y.o. who presents for a Medicare Wellness preventive visit.  Visit Complete: Virtual I connected with  [name] on 01/21/24 by a video and audio enabled telemedicine application and verified that I am speaking with the correct person using two identifiers.  Patient Location: Home  Provider Location: Office/Clinic  I discussed the limitations of evaluation and management by telemedicine. The patient expressed understanding and agreed to proceed.  Vital Signs: Because this visit was a virtual/telehealth visit, some criteria may be missing or patient reported. Any vitals not documented were not able to be obtained and vitals that have been documented are patient reported.   AWV Questionnaire: Yes: Patient Medicare AWV questionnaire was completed by the patient on 01/21/24; I have confirmed that all information answered by patient is correct and no changes since this date.  Cardiac Risk Factors include: advanced age (>25men, >24 women);dyslipidemia;hypertension;male gender;obesity (BMI >30kg/m2)     Objective:    Today's Vitals   01/21/24 1056  BP: 132/72  Weight: 203 lb (92.1 kg)  Height: 5' 8.75" (1.746 m)   Body mass index is 30.2 kg/m.     01/21/2024   11:06 AM 01/19/2023    8:51 AM 01/17/2022    9:58 AM 01/06/2021   12:55 PM 11/03/2020    9:00 PM 11/03/2020    7:19 AM 10/29/2020    1:21 PM  Advanced Directives  Does Patient Have a Medical Advance Directive? Yes Yes Yes Yes Yes  Yes  Type of Estate agent of [place];Living will Healthcare Power of [place];Living will Healthcare Power of [place];Living will Healthcare Power of [place];Living will Healthcare Power of [employer] Power of [employer] Power of [place];Living will  Does patient want to make changes to medical advance directive?  No - Patient declined No - Patient declined No - Patient declined No - Patient declined  No - Patient  declined  Copy of Healthcare Power of Attorney in Chart? No - copy requested No - copy requested No - copy requested Yes - validated most recent copy scanned in chart (See row information) No - copy requested  No - copy requested    Current Medications (verified) Outpatient Encounter Medications as of 01/21/2024  Medication Sig   acetaminophen (TYLENOL) 325 MG tablet Take 650 mg by mouth every 6 (six) hours as needed.   aspirin EC 81 MG tablet Take 81 mg by mouth daily.   atorvastatin (LIPITOR) 20 MG tablet TAKE 1 TABLET(20 MG) BY MOUTH DAILY   Calcium Citrate-Vitamin D (CITRACAL + D PO) Take 1 tablet by mouth daily.   Cholecalciferol (VITAMIN D3) 10000 UNITS TABS Take 10,000 Units by mouth daily.   docusate sodium (COLACE) 100 MG capsule Take 100 mg by mouth every other day.   famotidine (PEPCID) 40 MG tablet Take 1 tablet by mouth daily.   fluticasone (FLONASE) 50 MCG/ACT nasal spray SHAKE LIQUID AND USE 2 SPRAYS IN EACH NOSTRIL DAILY   losartan (COZAAR) 50 MG tablet Take 1 tablet (50 mg total) by mouth daily.   Facility-Administered Encounter Medications as of 01/21/2024  Medication   dexamethasone (DECADRON) injection 4 mg   triamcinolone acetonide (KENALOG) 10 MG/ML injection 10 mg    Allergies (verified) Patient has no known allergies.   History: Past Medical History:  Diagnosis Date   Arthritis    Cataract 01/02/2011   Bilateral - s/p excision   Colon polyps 2000   Hyperplastic - Repeat colonoscopy 2015  GERD (gastroesophageal reflux disease)    Pantoprazole   Hemorrhoids 2006   HLD (hyperlipidemia)    Iron deficiency anemia 03/20/2006   Donating too much blood   Migraine    No longer has problems   Osteopenia    on Fosamax   RLS (restless legs syndrome) 03/20/2006   2/2 iron deficiency   Schatzki's ring    Skin cancer 01/04/2012   Basal cell nose, squamous cell scalp - Dr. Herma Ard   Sleep apnea    Past Surgical History:  Procedure Laterality Date   BREAST BIOPSY  Left    benign   Cataract Surgery Bilateral    COLONOSCOPY WITH PROPOFOL N/A 04/18/2017   Procedure: COLONOSCOPY WITH PROPOFOL;  Surgeon: Earline Mayotte, MD;  Location: [facility] ENDOSCOPY;  Service: Endoscopy;  Laterality: N/A;   ENDOVASCULAR REPAIR/STENT GRAFT N/A 11/03/2020   Procedure: ENDOVASCULAR REPAIR/STENT GRAFT;  Surgeon: Annice Needy, MD;  Location: [facility] INVASIVE CV LAB;  Service: Cardiovascular;  Laterality: N/A;   INGUINAL HERNIA REPAIR Left    ROTATOR CUFF REPAIR Left    Family History  Problem Relation Age of Onset   Alcohol abuse Mother    Arthritis Mother    Hypertension Mother    Cancer Father 40       Throat Cancer - died   Alcohol abuse Father    Cancer Sister 54       Breast Cancer   Alcohol abuse Sister    Social History   Socioeconomic History   Marital status: Married    Spouse name: Not on file   Number of children: 2   Years of education: 12   Highest education level: 12th grade  Occupational History   Occupation: Production designer, theatre/television/film    Comment: Worked for [employer]   Occupation: Research officer, political party   Occupation: Production designer, theatre/television/film for [employer]  Tobacco Use   Smoking status: Former    Current packs/day: 0.00    Average packs/day: 2.0 packs/day for 21.0 years (42.0 ttl pk-yrs)    Types: Cigarettes    Start date: 34    Quit date: 1980    Years since quitting: 45.2   Smokeless tobacco: Never  Vaping Use   Vaping status: Never Used  Substance and Sexual Activity   Alcohol use: Yes    Comment: Occasional wine glass   Drug use: No   Sexual activity: Never  Other Topics Concern   Not on file  Social History Narrative   Jamie Zhang grew up in [place], [place]. He joined the [employer] right out of [employer] and served for 2.5 years then was active in [employer]. 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 He then worked in [employer] and afterward became a Production designer, theatre/television/film for [employer]. He recently moved to [place]  with his wife. They have two daughters that are still living in [place]. As a hobby he enjoys woodwork mainly with cabinet work.    Social Drivers of Corporate investment banker Strain: Low Risk  (01/21/2024)   Overall Financial Resource Strain (CARDIA)    Difficulty of Paying Living Expenses: Not hard at all  Food Insecurity: No Food Insecurity (01/21/2024)   Hunger Vital Sign    Worried About Running Out of Food in the Last Year: Never true    Ran Out of Food in the Last Year: Never true  Transportation Needs: No Transportation Needs (01/21/2024)   PRAPARE - Transportation  Lack of Transportation (Medical): No    Lack of Transportation (Non-Medical): No  Physical Activity: Sufficiently Active (01/21/2024)   Exercise Vital Sign    Days of Exercise per Week: 6 days    Minutes of Exercise per Session: 30 min  Stress: No Stress Concern Present (01/21/2024)   [employer] of Occupational Health - Occupational Stress Questionnaire    Feeling of Stress : Not at all  Social Connections: Socially Integrated (01/21/2024)   Social Connection and Isolation Panel [NHANES]    Frequency of Communication with Friends and Family: Three times a week    Frequency of Social Gatherings with Friends and Family: Three times a week    Attends Religious Services: More than 4 times per year    Active Member of Clubs or Organizations: Yes    Attends Engineer, structural: More than 4 times per year    Marital Status: Married    Tobacco Counseling Counseling given: Not Answered    Clinical Intake:  Pre-visit preparation completed: Yes  Pain : No/denies pain     BMI - recorded: 30.2 Nutritional Status: BMI > 30  Obese Nutritional Risks: None Diabetes: No  How often do you need to have someone help you when you read instructions, pamphlets, or other written materials from your doctor or pharmacy?: 1 - Never  Interpreter Needed?: No  Information entered by :: R. Alahna Dunne  LPN   Activities of Daily Living     01/21/2024   10:41 AM  In your present state of health, do you have any difficulty performing the following activities:  Hearing? 1  Comment wears aid  Vision? 0  Difficulty concentrating or making decisions? 0  Walking or climbing stairs? 0  Dressing or bathing? 0  Doing errands, shopping? 0  Preparing Food and eating ? N  Using the Toilet? N  In the past six months, have you accidently leaked urine? N  Do you have problems with loss of bowel control? N  Managing your Medications? N  Managing your Finances? N  Housekeeping or managing your Housekeeping? N    Patient Care Team: Sherlene Shams, MD as PCP - General (Internal Medicine) Sherlene Shams, MD (Internal Medicine) Lemar Livings Merrily Pew, MD (General Surgery) Wyn Quaker Marlow Baars, MD as Referring Physician (Vascular Surgery) Alanda Slim, MD as Consulting Physician (Sleep Medicine)  Indicate any recent Medical Services you may have received from other than [facility] providers in the past year (date may be approximate).     Assessment:   This is a routine wellness examination for [employer].  Hearing/Vision screen Hearing Screening - Comments:: Wears aids Vision Screening - Comments:: glasses   Goals Addressed             This Visit's Progress    Patient Stated       Wants to lose a few pounds       Depression Screen     01/21/2024   11:02 AM 04/23/2023    9:41 AM 01/19/2023    8:54 AM 04/17/2022   11:39 AM 01/17/2022   10:00 AM 08/12/2021   10:36 AM 01/06/2021   12:43 PM  PHQ 2/9 Scores  PHQ - 2 Score 0 0 0 0 0 0 0  PHQ- 9 Score 0          Fall Risk     01/21/2024   10:41 AM 04/23/2023    9:41 AM 01/12/2023   11:41 AM 04/17/2022   11:39 AM 01/17/2022  10:00 AM  Fall Risk   Falls in the past year? 0 0 0 0 0  Number falls in past yr: 0 0   0  Injury with Fall? 0 0 0    Risk for fall due to : No Fall Risks No Fall Risks  No Fall Risks   Follow up Falls prevention discussed;Falls  evaluation completed Falls evaluation completed Falls evaluation completed;Falls prevention discussed Falls evaluation completed Falls evaluation completed    MEDICARE RISK AT HOME:  Medicare Risk at Home Any stairs in or around the home?: (Patient-Rptd) Yes If so, are there any without handrails?: (Patient-Rptd) No Home free of loose throw rugs in walkways, pet beds, electrical cords, etc?: (Patient-Rptd) No Adequate lighting in your home to reduce risk of falls?: (Patient-Rptd) Yes Life alert?: (Patient-Rptd) Yes Use of a cane, walker or w/c?: (Patient-Rptd) No Grab bars in the bathroom?: (Patient-Rptd) Yes Shower chair or bench in shower?: (Patient-Rptd) No Elevated toilet seat or a handicapped toilet?: (Patient-Rptd) No  TIMED UP AND GO:  Was the test performed?  No  Cognitive Function: 6CIT completed    11/02/2016    8:55 AM  MMSE - Mini Mental State Exam  Orientation to time 5  Orientation to Place 5  Registration 3  Attention/ Calculation 5  Recall 3  Language- name 2 objects 2  Language- repeat 1  Language- follow 3 step command 3  Language- read & follow direction 1  Write a sentence 1  Copy design 1  Total score 30        01/21/2024   11:07 AM 01/19/2023    8:55 AM 01/17/2022   12:45 PM 01/06/2021   12:57 PM 11/10/2019    8:55 AM  6CIT Screen  What Year? 0 points 0 points 0 points 0 points 0 points  What month? 0 points 0 points 0 points 0 points 0 points  What time? 0 points 0 points 0 points 0 points 0 points  Count back from 20 0 points 0 points  0 points   Months in reverse 0 points 0 points  0 points   Repeat phrase 0 points 0 points  0 points   Total Score 0 points 0 points  0 points     Immunizations Immunization History  Administered Date(s) Administered   Fluad Quad(high Dose 65+) 07/28/2019   Fluad Trivalent(High Dose 65+) 08/15/2023   Influenza Split 09/01/2015   Influenza, High Dose Seasonal PF 08/14/2016, 08/19/2018    Influenza-Unspecified 08/25/2014, 08/01/2017, 08/10/2020   PFIZER(Purple Top)SARS-COV-2 Vaccination 11/28/2019, 12/17/2019, 08/19/2020, 03/20/2021, 08/24/2021   PNEUMOCOCCAL CONJUGATE-20 04/17/2022   Pneumococcal Conjugate-13 05/04/2015   Pneumococcal Polysaccharide-23 09/19/2005   Respiratory Syncytial Virus Vaccine,Recomb Aduvanted(Arexvy) 08/02/2022   Tdap 08/08/2010, 04/18/2022   Zoster Recombinant(Shingrix) 07/10/2018, 09/18/2018    Screening Tests Health Maintenance  Topic Date Due   COVID-19 Vaccine (6 - 2024-25 season) 07/15/2023   Medicare Annual Wellness (AWV)  01/20/2025   Colonoscopy  01/18/2026   DTaP/Tdap/Td (3 - Td or Tdap) 04/18/2032   Pneumonia Vaccine 63+ Years old  Completed   INFLUENZA VACCINE  Completed   Zoster Vaccines- Shingrix  Completed   HPV VACCINES  Aged Out    Health Maintenance  Health Maintenance Due  Topic Date Due   COVID-19 Vaccine (6 - 2024-25 season) 07/15/2023   Health Maintenance Items Addressed: See Nurse Notes  Additional Screening:  Vision Screening: Recommended annual ophthalmology exams for early detection of glaucoma and other disorders of the eye. Up to date  [facility]  Dental Screening: Recommended annual dental exams for proper oral hygiene  Community Resource Referral / Chronic Care Management: CRR required this visit?  No   CCM required this visit?  No     Plan:     I have personally reviewed and noted the following in the patient's chart:   Medical and social history Use of alcohol, tobacco or illicit drugs  Current medications and supplements including opioid prescriptions. Patient is not currently taking opioid prescriptions. Functional ability and status Nutritional status Physical activity Advanced directives List of other physicians Hospitalizations, surgeries, and ER visits in previous 12 months Vitals Screenings to include cognitive, depression, and falls Referrals and appointments  In addition,  I have reviewed and discussed with patient certain preventive protocols, quality metrics, and best practice recommendations. A written personalized care plan for preventive services as well as general preventive health recommendations were provided to patient.     Sydell Axon, LPN   1/61/0960   After Visit Summary: (MyChart) Due to this being a telephonic visit, the after visit summary with patients personalized plan was offered to patient via MyChart   Notes: Nothing significant to report at this time.

## 2024-01-31 DIAGNOSIS — H902 Conductive hearing loss, unspecified: Secondary | ICD-10-CM | POA: Diagnosis not present

## 2024-01-31 DIAGNOSIS — H6123 Impacted cerumen, bilateral: Secondary | ICD-10-CM | POA: Diagnosis not present

## 2024-02-04 DIAGNOSIS — H90A22 Sensorineural hearing loss, unilateral, left ear, with restricted hearing on the contralateral side: Secondary | ICD-10-CM | POA: Diagnosis not present

## 2024-02-04 DIAGNOSIS — H9122 Sudden idiopathic hearing loss, left ear: Secondary | ICD-10-CM | POA: Diagnosis not present

## 2024-02-18 DIAGNOSIS — D2262 Melanocytic nevi of left upper limb, including shoulder: Secondary | ICD-10-CM | POA: Diagnosis not present

## 2024-02-18 DIAGNOSIS — Z85828 Personal history of other malignant neoplasm of skin: Secondary | ICD-10-CM | POA: Diagnosis not present

## 2024-02-18 DIAGNOSIS — D2272 Melanocytic nevi of left lower limb, including hip: Secondary | ICD-10-CM | POA: Diagnosis not present

## 2024-02-18 DIAGNOSIS — D225 Melanocytic nevi of trunk: Secondary | ICD-10-CM | POA: Diagnosis not present

## 2024-02-18 DIAGNOSIS — L570 Actinic keratosis: Secondary | ICD-10-CM | POA: Diagnosis not present

## 2024-02-18 DIAGNOSIS — D0421 Carcinoma in situ of skin of right ear and external auricular canal: Secondary | ICD-10-CM | POA: Diagnosis not present

## 2024-02-18 DIAGNOSIS — D2261 Melanocytic nevi of right upper limb, including shoulder: Secondary | ICD-10-CM | POA: Diagnosis not present

## 2024-02-18 DIAGNOSIS — D485 Neoplasm of uncertain behavior of skin: Secondary | ICD-10-CM | POA: Diagnosis not present

## 2024-02-25 DIAGNOSIS — H9122 Sudden idiopathic hearing loss, left ear: Secondary | ICD-10-CM | POA: Diagnosis not present

## 2024-02-25 DIAGNOSIS — H903 Sensorineural hearing loss, bilateral: Secondary | ICD-10-CM | POA: Diagnosis not present

## 2024-03-04 ENCOUNTER — Encounter: Payer: Self-pay | Admitting: Sleep Medicine

## 2024-03-04 ENCOUNTER — Ambulatory Visit: Payer: Self-pay | Admitting: Sleep Medicine

## 2024-03-04 VITALS — BP 130/82 | HR 63 | Ht 68.75 in | Wt 205.0 lb

## 2024-03-04 DIAGNOSIS — I10 Essential (primary) hypertension: Secondary | ICD-10-CM

## 2024-03-04 DIAGNOSIS — Z87891 Personal history of nicotine dependence: Secondary | ICD-10-CM | POA: Diagnosis not present

## 2024-03-04 DIAGNOSIS — G4733 Obstructive sleep apnea (adult) (pediatric): Secondary | ICD-10-CM | POA: Diagnosis not present

## 2024-03-04 NOTE — Progress Notes (Signed)
 Name:Jamie Zhang MRN: [id number] DOB: 08-Jan-1941   CHIEF COMPLAINT:  CPAP F/U   HISTORY OF PRESENT ILLNESS:  Jamie Zhang is an 83 y.o. w/ a h/o OSA, HTN, GERD and hyperlipidemia who presents for CPAP follow up visit. Reports using CPAP therapy every night, which is confirmed by compliance data. He is currently using the Airfit F20 full face mask, which is comfortable. Reports feeling more refreshed upon awakening with CPAP therapy.    EPWORTH SLEEP SCORE    10/24/2018   11:00 AM  Results of the Epworth flowsheet  Sitting and reading 2  Watching TV 2  Sitting, inactive in a public place (e.g. a theatre or a meeting) 1  As a passenger in a car for an hour without a break 1  Lying down to rest in the afternoon when circumstances permit 1  Sitting and talking to someone 1  Sitting quietly after a lunch without alcohol 2  In a car, while stopped for a few minutes in traffic 0  Total score 10     PAST MEDICAL HISTORY :   has a past medical history of Arthritis, Cataract (01/02/2011), Colon polyps (2000), GERD (gastroesophageal reflux disease), Hemorrhoids (2006), HLD (hyperlipidemia), Iron deficiency anemia (03/20/2006), Migraine, Osteopenia, RLS (restless legs syndrome) (03/20/2006), Schatzki's ring, Skin cancer (01/04/2012), and Sleep apnea.  has a past surgical history that includes Cataract Surgery (Bilateral); Rotator cuff repair (Left); Breast biopsy (Left); Inguinal hernia repair (Left); Colonoscopy with propofol  (N/A, 04/18/2017); and ENDOVASCULAR REPAIR/STENT GRAFT (N/A, 11/03/2020). Prior to Admission medications   Medication Sig Start Date End Date Taking? Authorizing Provider  acetaminophen  (TYLENOL ) 325 MG tablet Take 650 mg by mouth every 6 (six) hours as needed.   Yes [provider]  aspirin  EC 81 MG tablet Take 81 mg by mouth daily.   Yes [provider]  atorvastatin  (LIPITOR) 20 MG tablet TAKE 1 TABLET BY MOUTH ONCE DAILY 01/22/24  Yes Tullo,  Teresa L, MD  Calcium  Citrate-Vitamin D  (CITRACAL + D PO) Take 1 tablet by mouth daily.   Yes [provider]  Cholecalciferol  (VITAMIN D3) 10000 UNITS TABS Take 10,000 Units by mouth daily.   Yes [provider]  docusate sodium  (COLACE) 100 MG capsule Take 100 mg by mouth every other day.   Yes [provider]  famotidine  (PEPCID ) 40 MG tablet Take 1 tablet by mouth daily.   Yes [provider]  fluticasone  (FLONASE ) 50 MCG/ACT nasal spray SHAKE LIQUID AND USE 2 SPRAYS IN EACH NOSTRIL DAILY 10/24/23  Yes Tullo, Teresa L, MD  losartan  (COZAAR ) 50 MG tablet Take 1 tablet (50 mg total) by mouth daily. 10/24/23  Yes Thersia Flax, MD   No Known Allergies  FAMILY HISTORY:  family history includes Alcohol abuse in his father, mother, and sister; Arthritis in his mother; Cancer (age of onset: 22) in his sister; Cancer (age of onset: 69) in his father; Hypertension in his mother. SOCIAL HISTORY:  reports that he quit smoking about 45 years ago. His smoking use included cigarettes. He started smoking about 66 years ago. He has a 42 pack-year smoking history. He has never used smokeless tobacco. He reports current alcohol use. He reports that he does not use drugs.   Review of Systems:  Gen:  Denies  fever, sweats, chills weight loss  HEENT: Denies blurred vision, double vision, ear pain, eye pain, hearing loss, nose bleeds, sore throat Cardiac:  No dizziness, chest pain or  heaviness, chest tightness,edema, No JVD Resp:   No cough, -sputum production, -shortness of breath,-wheezing, -hemoptysis,  Gi: Denies swallowing difficulty, stomach pain, nausea or vomiting, diarrhea, constipation, bowel incontinence Gu:  Denies bladder incontinence, burning urine Ext:   Denies Joint pain, stiffness or swelling Skin: Denies  skin rash, easy bruising or bleeding or hives Endoc:  Denies polyuria, polydipsia , polyphagia or weight change Psych:   Denies depression, insomnia  or hallucinations  Other:  All other systems negative  VITAL SIGNS: BP 130/82   Pulse 63   Ht 5' 8.75" (1.746 m)   Wt 205 lb (93 kg)   SpO2 95%   BMI 30.49 kg/m    Physical Examination:   General Appearance: No distress  EYES PERRLA, EOM intact.   NECK Supple, No JVD Pulmonary: normal breath sounds, No wheezing.  CardiovascularNormal S1,S2.  No m/r/g.   Abdomen: Benign, Soft, non-tender. Skin:   warm, no rashes, no ecchymosis  Extremities: normal, no cyanosis, clubbing. Neuro:without focal findings,  speech normal  PSYCHIATRIC: Mood, affect within normal limits.   ASSESSMENT AND PLAN  OSA Patient is using and benefiting from CPAP therapy. Discussed the consequences of untreated sleep apnea. Advised not to drive drowsy for safety of patient and others. Will complete further evaluation with a home sleep study and follow up to review results.    CAD  Stable, on current management. Following with PCP.    Patient  satisfied with Plan of action and management. All questions answered  I spent a total of 33 minutes reviewing chart data, face-to-face evaluation with the patient, counseling and coordination of care as detailed above.    Jamie Zhang, M.D.  Sleep Medicine [facility]

## 2024-03-04 NOTE — Patient Instructions (Signed)

## 2024-03-07 ENCOUNTER — Ambulatory Visit (INDEPENDENT_AMBULATORY_CARE_PROVIDER_SITE_OTHER): Payer: Medicare Other | Admitting: Vascular Surgery

## 2024-03-07 ENCOUNTER — Encounter (INDEPENDENT_AMBULATORY_CARE_PROVIDER_SITE_OTHER): Payer: Self-pay | Admitting: Vascular Surgery

## 2024-03-07 ENCOUNTER — Ambulatory Visit (INDEPENDENT_AMBULATORY_CARE_PROVIDER_SITE_OTHER): Payer: Medicare Other

## 2024-03-07 VITALS — BP 108/71 | HR 51 | Resp 18 | Ht 69.0 in | Wt 204.8 lb

## 2024-03-07 DIAGNOSIS — E781 Pure hyperglyceridemia: Secondary | ICD-10-CM

## 2024-03-07 DIAGNOSIS — R7303 Prediabetes: Secondary | ICD-10-CM

## 2024-03-07 DIAGNOSIS — I7143 Infrarenal abdominal aortic aneurysm, without rupture: Secondary | ICD-10-CM | POA: Diagnosis not present

## 2024-03-07 DIAGNOSIS — I10 Essential (primary) hypertension: Secondary | ICD-10-CM | POA: Diagnosis not present

## 2024-03-07 NOTE — Progress Notes (Signed)
 MRN : [id number]  Jamie Zhang is a 83 y.o. ([birth year]) male who presents with chief complaint of  Chief Complaint  Patient presents with   Follow-up    f/u in 1 year with [facility]  .  History of Present Illness: Patient returns today in follow up of his AAA.  He is several years s/p endovascular AAA repair. He is doing well.  No aneurysm related symptoms. Specifically, the patient denies new back or abdominal pain, or signs of peripheral embolization.  His aortic duplex today shows a stable 3.68 cm aortic sac size with no evidence of endoleak.  Current Outpatient Medications  Medication Sig Dispense Refill   acetaminophen  (TYLENOL ) 325 MG tablet Take 650 mg by mouth every 6 (six) hours as needed.     aspirin  EC 81 MG tablet Take 81 mg by mouth daily.     atorvastatin  (LIPITOR) 20 MG tablet TAKE 1 TABLET BY MOUTH ONCE DAILY 90 tablet 3   Calcium  Citrate-Vitamin D  (CITRACAL + D PO) Take 1 tablet by mouth daily.     Cholecalciferol  (VITAMIN D3) 10000 UNITS TABS Take 10,000 Units by mouth daily.     docusate sodium  (COLACE) 100 MG capsule Take 100 mg by mouth every other day.     famotidine  (PEPCID ) 40 MG tablet Take 1 tablet by mouth daily.     fluticasone  (FLONASE ) 50 MCG/ACT nasal spray SHAKE LIQUID AND USE 2 SPRAYS IN EACH NOSTRIL DAILY 48 g 1   losartan  (COZAAR ) 50 MG tablet Take 1 tablet (50 mg total) by mouth daily. 90 tablet 1   Current Facility-Administered Medications  Medication Dose Route Frequency Provider Last Rate Last Admin   dexamethasone  (DECADRON ) injection 4 mg  4 mg Other Once Floyce Hutching, DPM       triamcinolone  acetonide (KENALOG ) 10 MG/ML injection 10 mg  10 mg Other Once Floyce Hutching, DPM        Past Medical History:  Diagnosis Date   Arthritis    Cataract 01/02/2011   Bilateral - s/p excision   Colon polyps 2000   Hyperplastic - Repeat colonoscopy 2015   GERD (gastroesophageal reflux disease)    Pantoprazole    Hemorrhoids 2006   HLD  (hyperlipidemia)    Iron deficiency anemia 03/20/2006   Donating too much blood   Migraine    No longer has problems   Osteopenia    on Fosamax    RLS (restless legs syndrome) 03/20/2006   2/2 iron deficiency   Schatzki's ring    Skin cancer 01/04/2012   Basal cell nose, squamous cell scalp - Dr. Alvie Ax   Sleep apnea     Past Surgical History:  Procedure Laterality Date   BREAST BIOPSY Left    benign   Cataract Surgery Bilateral    COLONOSCOPY WITH PROPOFOL  N/A 04/18/2017   Procedure: COLONOSCOPY WITH PROPOFOL ;  Surgeon: Marshall Skeeter, MD;  Location: [facility] ENDOSCOPY;  Service: Endoscopy;  Laterality: N/A;   ENDOVASCULAR REPAIR/STENT GRAFT N/A 11/03/2020   Procedure: ENDOVASCULAR REPAIR/STENT GRAFT;  Surgeon: Celso College, MD;  Location: [facility] INV[facility]IVE [facility];  Service: Cardiovascular;  Laterality: N/A;   INGUINAL HERNIA REPAIR Left    ROTATOR CUFF REPAIR Left      Social History   Tobacco Use   Smoking status: Former    Current packs/day: 0.00    Average packs/day: 2.0 packs/day for 21.0 years (42.0 ttl pk-yrs)    Types: Cigarettes    Start date: 26  Quit date: 56    Years since quitting: 45.3   Smokeless tobacco: Never  Vaping Use   Vaping status: Never Used  Substance Use Topics   Alcohol use: Yes    Comment: Occasional wine glass   Drug use: No      Family History  Problem Relation Age of Onset   Alcohol abuse Mother    Arthritis Mother    Hypertension Mother    Cancer Father 73       Throat Cancer - died   Alcohol abuse Father    Cancer Sister 58       Breast Cancer   Alcohol abuse Sister      No Known Allergies     REVIEW OF SYSTEMS (Negative unless checked)   Constitutional: [] Weight loss  [] Fever  [] Chills Cardiac: [] Chest pain   [] Chest pressure   [] Palpitations   [] Shortness of breath when laying flat   [] Shortness of breath at rest   [] Shortness of breath with exertion. Vascular:  [] Pain in legs with walking   [] Pain in legs at rest    [] Pain in legs when laying flat   [] Claudication   [] Pain in feet when walking  [] Pain in feet at rest  [] Pain in feet when laying flat   [] History of DVT   [] Phlebitis   [] Swelling in legs   [] Varicose veins   [] Non-healing ulcers Pulmonary:   [] Uses home oxygen   [] Productive cough   [] Hemoptysis   [] Wheeze  [] COPD   [] Asthma Neurologic:  [] Dizziness  [] Blackouts   [] Seizures   [] History of stroke   [] History of TIA  [] Aphasia   [] Temporary blindness   [] Dysphagia   [] Weakness or numbness in arms   [] Weakness or numbness in legs Musculoskeletal:  [x] Arthritis   [] Joint swelling   [] Joint pain   [x] Low back pain Hematologic:  [] Easy bruising  [] Easy bleeding   [] Hypercoagulable state   [] Anemic   Gastrointestinal:  [] Blood in stool   [] Vomiting blood  [x] Gastroesophageal reflux/heartburn   [] Abdominal pain Genitourinary:  [] Chronic kidney disease   [] Difficult urination  [] Frequent urination  [] Burning with urination   [] Hematuria Skin:  [] Rashes   [] Ulcers   [] Wounds Psychological:  [] History of anxiety   []  History of major depression.  Physical Examination  BP 108/71   Pulse (!) 51   Resp 18   Ht 5\' 9"  (1.753 m)   Wt 204 lb 12.8 oz (92.9 kg)   BMI 30.24 kg/m  Gen:  WD/WN, NAD. Appears younger than stated age. Head: [place]/AT, No temporalis wasting. Ear/Nose/Throat: Hearing grossly intact, nares w/o erythema or drainage Eyes: Conjunctiva clear. Sclera non-icteric Neck: Supple.  Trachea midline Pulmonary:  Good air movement, no use of accessory muscles.  Cardiac: bradycardic Vascular:  Vessel Right Left  Radial Palpable Palpable           Musculoskeletal: M/S 5/5 throughout.  No deformity or atrophy. No edema. Neurologic: Sensation grossly intact in extremities.  Symmetrical.  Speech is fluent.  Psychiatric: Judgment intact, Mood & affect appropriate for pt's clinical situation. Dermatologic: No rashes or ulcers noted.  No cellulitis or open wounds.      Labs No results found  for this or any previous visit (from the past 2160 hours).  Radiology No results found.  Assessment/Plan  AAA (abdominal aortic aneurysm) without rupture His aortic duplex today shows a stable 3.68 cm aortic sac size with no evidence of endoleak.  Doing well several years after repair.  Continue to  follow on an annual basis.  No changes today.  Hypertension blood pressure control important in reducing the progression of atherosclerotic disease and aneurysmal disease. On appropriate oral medications.   HLD (hyperlipidemia) lipid control important in reducing the progression of atherosclerotic disease. Continue statin therapy   Prediabetes blood glucose control important in reducing the progression of atherosclerotic disease. Also, involved in wound healing. On appropriate medications.  Mikki Alexander, MD  03/07/2024 11:13 AM    This note was created with Dragon medical transcription system.  Any errors from dictation are purely unintentional

## 2024-03-07 NOTE — Assessment & Plan Note (Signed)
 His aortic duplex today shows a stable 3.68 cm aortic sac size with no evidence of endoleak.  Doing well several years after repair.  Continue to follow on an annual basis.  No changes today.

## 2024-04-01 ENCOUNTER — Encounter (INDEPENDENT_AMBULATORY_CARE_PROVIDER_SITE_OTHER): Payer: Self-pay

## 2024-04-04 DIAGNOSIS — G4733 Obstructive sleep apnea (adult) (pediatric): Secondary | ICD-10-CM | POA: Diagnosis not present

## 2024-04-08 DIAGNOSIS — D0421 Carcinoma in situ of skin of right ear and external auricular canal: Secondary | ICD-10-CM | POA: Diagnosis not present

## 2024-05-02 ENCOUNTER — Ambulatory Visit (INDEPENDENT_AMBULATORY_CARE_PROVIDER_SITE_OTHER): Payer: Medicare Other | Admitting: Internal Medicine

## 2024-05-02 ENCOUNTER — Encounter: Payer: Self-pay | Admitting: Internal Medicine

## 2024-05-02 VITALS — BP 124/72 | HR 54 | Ht 69.0 in | Wt 203.2 lb

## 2024-05-02 DIAGNOSIS — R5383 Other fatigue: Secondary | ICD-10-CM

## 2024-05-02 DIAGNOSIS — M85859 Other specified disorders of bone density and structure, unspecified thigh: Secondary | ICD-10-CM | POA: Diagnosis not present

## 2024-05-02 DIAGNOSIS — Z974 Presence of external hearing-aid: Secondary | ICD-10-CM | POA: Insufficient documentation

## 2024-05-02 DIAGNOSIS — D518 Other vitamin B12 deficiency anemias: Secondary | ICD-10-CM | POA: Diagnosis not present

## 2024-05-02 DIAGNOSIS — D126 Benign neoplasm of colon, unspecified: Secondary | ICD-10-CM

## 2024-05-02 DIAGNOSIS — Z8781 Personal history of (healed) traumatic fracture: Secondary | ICD-10-CM

## 2024-05-02 DIAGNOSIS — R200 Anesthesia of skin: Secondary | ICD-10-CM | POA: Diagnosis not present

## 2024-05-02 DIAGNOSIS — N1831 Chronic kidney disease, stage 3a: Secondary | ICD-10-CM | POA: Diagnosis not present

## 2024-05-02 DIAGNOSIS — E782 Mixed hyperlipidemia: Secondary | ICD-10-CM

## 2024-05-02 DIAGNOSIS — I10 Essential (primary) hypertension: Secondary | ICD-10-CM | POA: Diagnosis not present

## 2024-05-02 DIAGNOSIS — R7303 Prediabetes: Secondary | ICD-10-CM | POA: Diagnosis not present

## 2024-05-02 DIAGNOSIS — G6289 Other specified polyneuropathies: Secondary | ICD-10-CM | POA: Diagnosis not present

## 2024-05-02 LAB — COMPREHENSIVE METABOLIC PANEL WITH GFR
ALT: 12 U/L (ref 0–53)
AST: 13 U/L (ref 0–37)
Albumin: 4.4 g/dL (ref 3.5–5.2)
Alkaline Phosphatase: 44 U/L (ref 39–117)
BUN: 21 mg/dL (ref 6–23)
CO2: 29 meq/L (ref 19–32)
Calcium: 9.6 mg/dL (ref 8.4–10.5)
Chloride: 105 meq/L (ref 96–112)
Creatinine, Ser: 1 mg/dL (ref 0.40–1.50)
GFR: 69.34 mL/min (ref >60.00)
Glucose, Bld: 91 mg/dL (ref 70–99)
Potassium: 4.5 meq/L (ref 3.5–5.1)
Sodium: 141 meq/L (ref 135–145)
Total Bilirubin: 1.8 mg/dL — ABNORMAL HIGH (ref 0.2–1.2)
Total Protein: 6.3 g/dL (ref 6.0–8.3)

## 2024-05-02 LAB — HEMOGLOBIN A1C: Hgb A1c MFr Bld: 5.8 % (ref 4.6–6.5)

## 2024-05-02 LAB — LIPID PANEL
Cholesterol: 107 mg/dL (ref 0–200)
HDL: 56.9 mg/dL (ref >39.00)
LDL Cholesterol: 36 mg/dL (ref 0–99)
NonHDL: 50.38
Total CHOL/HDL Ratio: 2
Triglycerides: 71 mg/dL (ref 0.0–149.0)
VLDL: 14.2 mg/dL (ref 0.0–40.0)

## 2024-05-02 LAB — B12 AND FOLATE PANEL
Folate: 11.9 ng/mL (ref >5.9)
Vitamin B-12: 341 pg/mL (ref 211–911)

## 2024-05-02 LAB — TSH: TSH: 1.95 u[IU]/mL (ref 0.35–5.50)

## 2024-05-02 LAB — LDL CHOLESTEROL, DIRECT: Direct LDL: 34 mg/dL

## 2024-05-02 MED ORDER — FLUTICASONE PROPIONATE 50 MCG/ACT NA SUSP: NASAL | 1 refills | Status: AC

## 2024-05-02 MED ORDER — LOSARTAN POTASSIUM 50 MG PO TABS: 50.0000 mg | ORAL_TABLET | Freq: Every day | ORAL | 1 refills | Status: DC

## 2024-05-02 NOTE — Progress Notes (Unsigned)
 Subjective:  Patient ID: Jamie Zhang, male    DOB: Dec 25, 1940  Age: 83 y.o. MRN: [id number]  CC: The primary encounter diagnosis was Primary hypertension. A diagnosis of Prediabetes was also pertinent to this visit.   HPI Terry Ficks presents for  Chief Complaint  Patient presents with   Medical Management of Chronic Issues   1) CKD stage 3  2) AAA s/p repair:  stable per repeat US  in April by Mikki Alexander   3) HTN:  checks BP at home monthly  readings   4) stress :  wife Joella Musa is sowing signs of cognitive decline.  Has not been diagnosed with dementia  by neurology  yet., told she had age related changes .  But he is managing her medications and does not let her drive anymore .  Still has time to himself .   )  neuropathy:  affecting his feet,  worried about driving    Outpatient Medications Prior to Visit  Medication Sig Dispense Refill   acetaminophen  (TYLENOL ) 325 MG tablet Take 650 mg by mouth every 6 (six) hours as needed.     aspirin  EC 81 MG tablet Take 81 mg by mouth daily.     atorvastatin  (LIPITOR) 20 MG tablet TAKE 1 TABLET BY MOUTH ONCE DAILY 90 tablet 3   Calcium  Citrate-Vitamin D  (CITRACAL + D PO) Take 1 tablet by mouth daily.     Cholecalciferol  (VITAMIN D3) 10000 UNITS TABS Take 10,000 Units by mouth daily.     docusate sodium  (COLACE) 100 MG capsule Take 100 mg by mouth every other day.     famotidine  (PEPCID ) 40 MG tablet Take 1 tablet by mouth daily.     fluticasone  (FLONASE ) 50 MCG/ACT nasal spray SHAKE LIQUID AND USE 2 SPRAYS IN EACH NOSTRIL DAILY 48 g 1   losartan  (COZAAR ) 50 MG tablet Take 1 tablet (50 mg total) by mouth daily. 90 tablet 1   Facility-Administered Medications Prior to Visit  Medication Dose Route Frequency Provider Last Rate Last Admin   dexamethasone  (DECADRON ) injection 4 mg  4 mg Other Once Floyce Hutching, DPM       triamcinolone  acetonide (KENALOG ) 10 MG/ML injection 10 mg  10 mg Other Once Floyce Hutching, DPM        Review of  Systems;  Patient denies headache, fevers, malaise, unintentional weight loss, skin rash, eye pain, sinus congestion and sinus pain, sore throat, dysphagia,  hemoptysis , cough, dyspnea, wheezing, chest pain, palpitations, orthopnea, edema, abdominal pain, nausea, melena, diarrhea, constipation, flank pain, dysuria, hematuria, urinary  Frequency, nocturia, numbness, tingling, seizures,  Focal weakness, Loss of consciousness,  Tremor, insomnia, depression, anxiety, and suicidal ideation.      Objective:  BP 124/72   Pulse (!) 54   Ht 5' 9 (1.753 m)   Wt 203 lb 3.2 oz (92.2 kg)   SpO2 95%   BMI 30.01 kg/m   BP Readings from Last 3 Encounters:  05/02/24 124/72  03/07/24 108/71  03/04/24 130/82    Wt Readings from Last 3 Encounters:  05/02/24 203 lb 3.2 oz (92.2 kg)  03/07/24 204 lb 12.8 oz (92.9 kg)  03/04/24 205 lb (93 kg)    Physical Exam  Lab Results  Component Value Date   HGBA1C 5.9 10/24/2023   HGBA1C 6.0 04/23/2023   HGBA1C 6.0 08/25/2021    Lab Results  Component Value Date   CREATININE 1.05 10/24/2023   CREATININE 1.12 04/23/2023   CREATININE 1.09  04/17/2022    Lab Results  Component Value Date   WBC 6.1 10/24/2023   HGB 15.0 10/24/2023   HCT 44.6 10/24/2023   PLT 187.0 10/24/2023   GLUCOSE 96 10/24/2023   CHOL 117 10/24/2023   TRIG 60.0 10/24/2023   HDL 55.60 10/24/2023   LDLDIRECT 48.0 10/24/2023   LDLCALC 49 10/24/2023   ALT 24 10/24/2023   AST 18 10/24/2023   NA 143 10/24/2023   K 4.4 10/24/2023   CL 108 10/24/2023   CREATININE 1.05 10/24/2023   BUN 26 (H) 10/24/2023   CO2 28 10/24/2023   TSH 2.73 10/24/2023   PSA 1.19 08/08/2019   INR 1.1 11/01/2020   HGBA1C 5.9 10/24/2023   MICROALBUR 1.0 10/24/2023    DG Bone Density Result Date: 06/20/2022 EXAM: DUAL X-RAY ABSORPTIOMETRY (DXA) FOR BONE MINERAL DENSITY IMPRESSION: Your patient Matin Mattioli completed a BMD test on 06/20/2022 using the [employer] DXA System (software version: 14.10)  manufactured by [employer]. The following summarizes the results of our evaluation. Technologist: SCE PATIENT BIOGRAPHICAL: Name: Viktor, Philipp Patient ID: [id number] Birth Date: [birth year] Height: 68.5 in. Gender: Male Exam Date: 06/20/2022 Weight: 200.7 lbs. Indications: Advanced Age, Caucasian, Height Loss Fractures: Treatments: Calcium , Vitamin D  DENSITOMETRY RESULTS: Site         Region     Measured Date Measured Age WHO Classification Young Adult T-score BMD         %Change vs. Previous Significant Change (*) DualFemur Neck Right 06/20/2022 82.1 Osteopenia -1.6 0.857 g/cm2 -4.5% - DualFemur Neck Right 05/02/2018 78.0 Osteopenia -1.3 0.897 g/cm2 -1.4% - DualFemur Neck Right 06/16/2015 75.1 Osteopenia -1.2 0.910 g/cm2 - - DualFemur Total Mean 06/20/2022 82.1 Osteopenia -1.2 0.928 g/cm2 -2.6% Yes DualFemur Total Mean 05/02/2018 78.0 Normal -1.0 0.953 g/cm2 -0.6% - DualFemur Total Mean 06/16/2015 75.1 Normal -1.0 0.959 g/cm2 - - Left Forearm Radius 33% 06/20/2022 82.1 Normal -0.5 0.944 g/cm2 -0.1% - Left Forearm Radius 33% 05/02/2018 78.0 Normal -0.4 0.945 g/cm2 1.2% - Left Forearm Radius 33% 06/16/2015 75.1 Normal -0.6 0.934 g/cm2 - - ASSESSMENT: The BMD measured at Femur Neck Right is 0.857 g/cm2 with a T-score of -1.6. This patient is considered osteopenic according to the World Health Organization [facility]) criteria. The scan quality is good. Lumbar spine was not utilized due to advanced degenerative changes. Compared with prior study, there has been a significant decrease in the total hip. World Science writer [facility]) criteria for post-menopausal, Caucasian Women: Normal:                   T-score at or above -1 SD Osteopenia/low bone mass: T-score between -1 and -2.5 SD Osteoporosis:             T-score at or below -2.5 SD RECOMMENDATIONS: 1. All patients should optimize calcium  and vitamin D  intake. 2. Consider FDA-approved medical therapies in postmenopausal women and men aged 25 years and  older, based on the following: a. A hip or vertebral(clinical or morphometric) fracture b. T-score < -2.5 at the femoral neck or spine after appropriate evaluation to exclude secondary causes c. Low bone mass (T-score between -1.0 and -2.5 at the femoral neck or spine) and a 10-year probability of a hip fracture > 3% or a 10-year probability of a major osteoporosis-related fracture > 20% based on the [place] -adapted WHO algorithm 3. Clinician judgment and/or patient preferences may indicate treatment for people with 10-year fracture probabilities above or below these levels FOLLOW-UP: People with diagnosed cases  of osteoporosis or osteopenia should be regularly tested for bone mineral density. For patients eligible for Medicare, routine testing is allowed once every 2 years. The testing frequency can be increased to one year for patients who have rapidly progressing disease, or for those who are receiving medical therapy to restore bone mass. I have reviewed this report, and agree with the above findings. [facility] Dear Dr Madelon Scheuermann, Your patient Terry Ficks completed a FRAX assessment on 06/20/2022 using the [employer] iDXA DXA System (analysis version: 14.10) manufactured by [employer]. The following summarizes the results of our evaluation. PATIENT BIOGRAPHICAL: Name: Faheem, Ziemann Patient ID: [id number] Birth Date: [birth year] Height:    68.5 in. Gender:     [age]      [age]       Weight:    200.7 lbs. Ethnicity:  White                            Exam Date: 06/20/2022 FRAX* RESULTS:  (version: 3.5) 10-year Probability of Fracture1 Major Osteoporotic Fracture2 Hip Fracture 7.6% 2.9% Population: [place]  (Caucasian) Risk Factors: None Based on Femur (Right) Neck BMD 1 -The 10-year probability of fracture may be lower than reported if the patient has received treatment. 2 -Major Osteoporotic Fracture: Clinical Spine, Forearm, Hip or Shoulder *FRAX is a Armed forces logistics/support/administrative officer of the [employer] of [employer] for Metabolic Bone Disease, a World Science writer (WHO) [employer]. ASSESSMENT: The probability of a major osteoporotic fracture is 7.6% within the next ten years. The probability of a hip fracture is 2.9% within the next ten years. . Electronically Signed   By: Alinda Apley M.D.   On: 06/20/2022 09:40    Assessment & Plan:  .Primary hypertension  Prediabetes     I spent 34 minutes on the day of this face to face encounter reviewing patient's  most recent visit with cardiology,  nephrology,  and neurology,  prior relevant surgical and non surgical procedures, recent  labs and imaging studies, counseling on weight management,  reviewing the assessment and plan with patient, and post visit ordering and reviewing of  diagnostics and therapeutics with patient  .   Follow-up: No follow-ups on file.   Thersia Flax, MD

## 2024-05-02 NOTE — Assessment & Plan Note (Signed)
 No foot drop .  Feels the loss of sensation ihas become  progressive.    Not limiting activity.

## 2024-05-02 NOTE — Assessment & Plan Note (Signed)
 2 TAs were removed during y 2018 colonoscopy  .  He was advised to repeat in 5 yrs. He met with the gastroenterologist and jointly decided to stop screening due to his age

## 2024-05-02 NOTE — Patient Instructions (Addendum)
 Your blood pressure is currently at goal I of 120/70 ;   not 130/80.  Lower BP is recommended  because of the aortic  aneurysm    and the kidney disease   You can schedule an RN visit to have your blood pressure  machine calibrated   Please consider going to [facility]'s support group

## 2024-05-05 ENCOUNTER — Ambulatory Visit: Payer: Self-pay | Admitting: Internal Medicine

## 2024-05-05 DIAGNOSIS — R5383 Other fatigue: Secondary | ICD-10-CM | POA: Insufficient documentation

## 2024-05-05 NOTE — Assessment & Plan Note (Signed)
 Managed with statin therapy,  LDL is at goal of < 70. LFTs normal .  No changes today Lab Results  Component Value Date   CHOL 107 05/02/2024   HDL 56.90 05/02/2024   LDLCALC 36 05/02/2024   LDLDIRECT 34.0 05/02/2024   TRIG 71.0 05/02/2024   CHOLHDL 2 05/02/2024    Lab Results  Component Value Date   ALT 12 05/02/2024   AST 13 05/02/2024   ALKPHOS 44 05/02/2024   BILITOT 1.8 (H) 05/02/2024

## 2024-05-05 NOTE — Assessment & Plan Note (Signed)
 Reviewed goals of BP management with goal 120/70 due to CKD and AAA.  Continue losartan  50 g daily. RN visit needed to calibrate home machine for monitoring

## 2024-05-05 NOTE — Assessment & Plan Note (Signed)
 He appears clinically depressed but denies symptoms and defers therapy.  Encouraged to participate in the support groups offered by [facility] for spouses of patients with cognitive decline

## 2024-05-05 NOTE — Assessment & Plan Note (Signed)
 Seen by nephrology. Indices have improved with dc NSAIDS.  Continue Follow u p annually with nephrology  Lab Results  Component Value Date   CREATININE 1.00 05/02/2024

## 2024-05-05 NOTE — Assessment & Plan Note (Signed)
 He has obtain HA's since his last visit

## 2024-05-05 NOTE — Assessment & Plan Note (Signed)
 Treated with 5 yrs of alendronate .  Post discontinuation of therapy he remains osteopenic byl ast DEXA 2023

## 2024-05-05 NOTE — Assessment & Plan Note (Addendum)
 Reviewed 2023 DEXA:  scores are in  osteopenic range and BD has decreased since 2019.  Jamie Zhang  He has had a Slight decrease in hip density by 2023 DEXA ,  After stopping alendronate   several years ago. Jamie Zhang HE DENIES ANY HISTORY OF VERTEBRAL FRACTURES, BUT HIS 2011 OFFICE NOTE FROM the osteoporosis clinic notes mild to moderate wedge deformities from T7 to L1  .     Recent thoracic /lumbar spine films are negative for fractures.

## 2024-05-06 ENCOUNTER — Ambulatory Visit (INDEPENDENT_AMBULATORY_CARE_PROVIDER_SITE_OTHER)

## 2024-05-06 DIAGNOSIS — I10 Essential (primary) hypertension: Secondary | ICD-10-CM

## 2024-05-06 NOTE — Progress Notes (Signed)
 Patient here for nurse visit BP check per order from Dr Marylynn   Patient reports compliance with prescribed BP medications: yes  Last dose of BP medication: 05/06/24  BP Readings from Last 3 Encounters:  05/06/24 118/74  05/02/24 124/72  03/07/24 108/71   Pulse Readings from Last 3 Encounters:  05/06/24 72  05/02/24 (!) 54  03/07/24 (!) 51    Pt came in to office to have home BP machine callibrated, I took his bp in his left arm with his cuff BP was 119/70 and I took it in his left arm with the manual cuff and bp was 118/74

## 2024-05-11 ENCOUNTER — Encounter: Payer: Self-pay | Admitting: Internal Medicine

## 2024-05-12 ENCOUNTER — Encounter: Payer: Self-pay | Admitting: Internal Medicine

## 2024-05-12 ENCOUNTER — Ambulatory Visit (INDEPENDENT_AMBULATORY_CARE_PROVIDER_SITE_OTHER): Admitting: Internal Medicine

## 2024-05-12 VITALS — BP 120/66 | HR 59 | Ht 69.0 in | Wt 200.0 lb

## 2024-05-12 DIAGNOSIS — G8929 Other chronic pain: Secondary | ICD-10-CM

## 2024-05-12 DIAGNOSIS — R29898 Other symptoms and signs involving the musculoskeletal system: Secondary | ICD-10-CM | POA: Diagnosis not present

## 2024-05-12 DIAGNOSIS — D3132 Benign neoplasm of left choroid: Secondary | ICD-10-CM | POA: Diagnosis not present

## 2024-05-12 DIAGNOSIS — M545 Low back pain, unspecified: Secondary | ICD-10-CM

## 2024-05-12 DIAGNOSIS — Z961 Presence of intraocular lens: Secondary | ICD-10-CM | POA: Diagnosis not present

## 2024-05-12 DIAGNOSIS — H26491 Other secondary cataract, right eye: Secondary | ICD-10-CM | POA: Diagnosis not present

## 2024-05-12 DIAGNOSIS — H43813 Vitreous degeneration, bilateral: Secondary | ICD-10-CM | POA: Diagnosis not present

## 2024-05-12 NOTE — Progress Notes (Unsigned)
 Subjective:  Patient ID: Jamie Zhang, male    DOB: Feb 23, 1941  Age: 83 y.o. MRN: [id number]  CC: There were no encounter diagnoses.   HPI Jamie Zhang presents for  Chief Complaint  Patient presents with   leg numbness    Left lower leg numbness. The numbness comes and goes. Pt has had several episodes where all of sudden his leg goes numb and can barely walk.     3 EPISODES .  FIRST EPISODE OF LEFT LEG FEELING DEAD  FOR 5 MINUTES ,  OCCURRED AFTER PROLONGED SITTING IN A RECLINER . 2ND EPISODE OCCURRED AFTER WALKING ON A TREADMILL .  NO PAIN  3RD EPISODE OCCURRED AFTER GETTING OUT OF THE DRIVER'S SEAT ,    Outpatient Medications Prior to Visit  Medication Sig Dispense Refill   acetaminophen  (TYLENOL ) 325 MG tablet Take 650 mg by mouth every 6 (six) hours as needed.     aspirin  EC 81 MG tablet Take 81 mg by mouth daily.     atorvastatin  (LIPITOR) 20 MG tablet TAKE 1 TABLET BY MOUTH ONCE DAILY 90 tablet 3   Calcium  Citrate-Vitamin D  (CITRACAL + D PO) Take 1 tablet by mouth daily.     Cholecalciferol  (VITAMIN D3) 10000 UNITS TABS Take 10,000 Units by mouth daily.     docusate sodium  (COLACE) 100 MG capsule Take 100 mg by mouth every other day.     famotidine  (PEPCID ) 40 MG tablet Take 1 tablet by mouth daily.     fluticasone  (FLONASE ) 50 MCG/ACT nasal spray SHAKE LIQUID AND USE 2 SPRAYS IN EACH NOSTRIL DAILY 48 g 1   losartan  (COZAAR ) 50 MG tablet Take 1 tablet (50 mg total) by mouth daily. 90 tablet 1   Facility-Administered Medications Prior to Visit  Medication Dose Route Frequency Provider Last Rate Last Admin   dexamethasone  (DECADRON ) injection 4 mg  4 mg Other Once Silva Juliene SAUNDERS, DPM       triamcinolone  acetonide (KENALOG ) 10 MG/ML injection 10 mg  10 mg Other Once Silva Juliene SAUNDERS, DPM        Review of Systems;  Patient denies headache, fevers, malaise, unintentional weight loss, skin rash, eye pain, sinus congestion and sinus pain, sore throat, dysphagia,  hemoptysis  , cough, dyspnea, wheezing, chest pain, palpitations, orthopnea, edema, abdominal pain, nausea, melena, diarrhea, constipation, flank pain, dysuria, hematuria, urinary  Frequency, nocturia, numbness, tingling, seizures,  Focal weakness, Loss of consciousness,  Tremor, insomnia, depression, anxiety, and suicidal ideation.      Objective:  BP 120/66   Pulse (!) 59   Ht 5' 9 (1.753 m)   Wt 200 lb (90.7 kg)   SpO2 95%   BMI 29.53 kg/m   BP Readings from Last 3 Encounters:  05/12/24 120/66  05/06/24 118/74  05/02/24 124/72    Wt Readings from Last 3 Encounters:  05/12/24 200 lb (90.7 kg)  05/02/24 203 lb 3.2 oz (92.2 kg)  03/07/24 204 lb 12.8 oz (92.9 kg)    Physical Exam Vitals reviewed.  Constitutional:      General: He is not in acute distress.    Appearance: Normal appearance. He is normal weight. He is not ill-appearing, toxic-appearing or diaphoretic.  HENT:     Head: Normocephalic.   Eyes:     General: No scleral icterus.       Right eye: No discharge.        Left eye: No discharge.     Conjunctiva/sclera: Conjunctivae normal.  Cardiovascular:     Rate and Rhythm: Normal rate and regular rhythm.     Heart sounds: Normal heart sounds.  Pulmonary:     Effort: Pulmonary effort is normal. No respiratory distress.     Breath sounds: Normal breath sounds.   Musculoskeletal:        General: Normal range of motion.     Cervical back: Normal range of motion.   Skin:    General: Skin is warm and dry.   Neurological:     General: No focal deficit present.     Mental Status: He is alert and oriented to person, place, and time. Mental status is at baseline.     Motor: Motor function is intact.     Coordination: Coordination is intact.     Deep Tendon Reflexes: Reflexes abnormal.     Reflex Scores:      Patellar reflexes are 1+ on the right side and 1+ on the left side.      Achilles reflexes are 0 on the right side and 0 on the left side.  Psychiatric:         Mood and Affect: Mood normal.        Behavior: Behavior normal.        Thought Content: Thought content normal.        Judgment: Judgment normal.   Lab Results  Component Value Date   HGBA1C 5.8 05/02/2024   HGBA1C 5.9 10/24/2023   HGBA1C 6.0 04/23/2023    Lab Results  Component Value Date   CREATININE 1.00 05/02/2024   CREATININE 1.05 10/24/2023   CREATININE 1.12 04/23/2023    Lab Results  Component Value Date   WBC 6.1 10/24/2023   HGB 15.0 10/24/2023   HCT 44.6 10/24/2023   PLT 187.0 10/24/2023   GLUCOSE 91 05/02/2024   CHOL 107 05/02/2024   TRIG 71.0 05/02/2024   HDL 56.90 05/02/2024   LDLDIRECT 34.0 05/02/2024   LDLCALC 36 05/02/2024   ALT 12 05/02/2024   AST 13 05/02/2024   NA 141 05/02/2024   K 4.5 05/02/2024   CL 105 05/02/2024   CREATININE 1.00 05/02/2024   BUN 21 05/02/2024   CO2 29 05/02/2024   TSH 1.95 05/02/2024   PSA 1.19 08/08/2019   INR 1.1 11/01/2020   HGBA1C 5.8 05/02/2024    DG Bone Density Result Date: 06/20/2022 EXAM: DUAL X-RAY ABSORPTIOMETRY (DXA) FOR BONE MINERAL DENSITY IMPRESSION: Your patient Jamie Zhang completed a BMD test on 06/20/2022 using the [employer] DXA System (software version: 14.10) manufactured by [employer]. The following summarizes the results of our evaluation. Technologist: SCE PATIENT BIOGRAPHICAL: Name: Jamie Zhang, Jamie Zhang Patient ID: [id number] Birth Date: [birth year] Height: 68.5 in. Gender: Male Exam Date: 06/20/2022 Weight: 200.7 lbs. Indications: Advanced Age, Caucasian, Height Loss Fractures: Treatments: Calcium , Vitamin D  DENSITOMETRY RESULTS: Site         Region     Measured Date Measured Age WHO Classification Young Adult T-score BMD         %Change vs. Previous Significant Change (*) DualFemur Neck Right 06/20/2021 82.1 Osteopenia -1.6 0.857 g/cm2 -4.5% - DualFemur Neck Right 05/02/2018 78.0 Osteopenia -1.3 0.897 g/cm2 -1.4% - DualFemur Neck Right 06/16/2015 75.1 Osteopenia -1.2 0.910 g/cm2 - - DualFemur Total  Mean 06/20/2022 82.1 Osteopenia -1.2 0.928 g/cm2 -2.6% Yes DualFemur Total Mean 05/02/2018 78.0 Normal -1.0 0.953 g/cm2 -0.6% - DualFemur Total Mean 06/16/2015 75.1 Normal -1.0 0.959 g/cm2 - - Left Forearm Radius 33% 06/20/2022 82.1 Normal -  0.5 0.944 g/cm2 -0.1% - Left Forearm Radius 33% 05/02/2018 78.0 Normal -0.4 0.945 g/cm2 1.2% - Left Forearm Radius 33% 06/16/2015 75.1 Normal -0.6 0.934 g/cm2 - - ASSESSMENT: The BMD measured at Femur Neck Right is 0.857 g/cm2 with a T-score of -1.6. This patient is considered osteopenic according to the World Health Organization [facility]) criteria. The scan quality is good. Lumbar spine was not utilized due to advanced degenerative changes. Compared with prior study, there has been a significant decrease in the total hip. World Science writer [facility]) criteria for post-menopausal, Caucasian Women: Normal:                   T-score at or above -1 SD Osteopenia/low bone mass: T-score between -1 and -2.5 SD Osteoporosis:             T-score at or below -2.5 SD RECOMMENDATIONS: 1. All patients should optimize calcium  and vitamin D  intake. 2. Consider FDA-approved medical therapies in postmenopausal women and men aged 48 years and older, based on the following: a. A hip or vertebral(clinical or morphometric) fracture b. T-score < -2.5 at the femoral neck or spine after appropriate evaluation to exclude secondary causes c. Low bone mass (T-score between -1.0 and -2.5 at the femoral neck or spine) and a 10-year probability of a hip fracture > 3% or a 10-year probability of a major osteoporosis-related fracture > 20% based on the [place] -adapted WHO algorithm 3. Clinician judgment and/or patient preferences may indicate treatment for people with 10-year fracture probabilities above or below these levels FOLLOW-UP: People with diagnosed cases of osteoporosis or osteopenia should be regularly tested for bone mineral density. For patients eligible for Medicare, routine testing is allowed once  every 2 years. The testing frequency can be increased to one year for patients who have rapidly progressing disease, or for those who are receiving medical therapy to restore bone mass. I have reviewed this report, and agree with the above findings. [facility] Dear Dr Marylynn, Your patient Jamie Zhang completed a FRAX assessment on 06/20/2022 using the [employer] iDX[employer] DXA System (analysis version: 14.10) manufactured by [employer]. The following summarizes the results of our evaluation. PATIENT BIOGRAPHICAL: Name: Jamie Zhang, Jamie Zhang Patient ID: [id number] Birth Date: Jun 09, 1941 Height:    68.5 in. Gender:     [age]      [age]       Weight:    200.7 lbs. Ethnicity:  White                            Exam Date: 06/20/2022 FRAX* RESULTS:  (version: 3.5) 10-year Probability of Fracture1 Major Osteoporotic Fracture2 Hip Fracture 7.6% 2.9% Population: [place]  (Caucasian) Risk Factors: None Based on Femur (Right) Neck BMD 1 -The 10-year probability of fracture may be lower than reported if the patient has received treatment. 2 -Major Osteoporotic Fracture: Clinical Spine, Forearm, Hip or Shoulder *FRAX is a Armed forces logistics/support/administrative officer of the [employer] for Metabolic Bone Disease, a World Science writer (WHO) [employer]. ASSESSMENT: The probability of a major osteoporotic fracture is 7.6% within the next ten years. The probability of a hip fracture is 2.9% within the next ten years. . Electronically Signed   By: Rosaline Collet M.D.   On: 06/20/2022 09:40    Assessment & Plan:  .There are no diagnoses linked to this encounter.   I spent 34 minutes on  the day of this face to face encounter reviewing patient's  most recent visit with cardiology,  nephrology,  and neurology,  prior relevant surgical and non surgical procedures, recent  labs and imaging studies, counseling on weight management,  reviewing the assessment and plan with patient, and post visit ordering and  reviewing of  diagnostics and therapeutics with patient  .   Follow-up: No follow-ups on file.   Verneita LITTIE Kettering, MD

## 2024-05-12 NOTE — Patient Instructions (Addendum)
 I HAVE ORDERED AN MRI OF YOUR LUMBAR SPINE TO RULE OUT A STENOSIS OF A NERVE ROOT THAT MAY BE CAUSING THE EPISODES OF LEFT LEG NUMBNESS AND WEAKNESS   AFTER PROLONGED SITTING YOU SHOULD PUMP YOUR LEG STARTING AT THE HIP TO INCREASE THE BLOOD FLOW AND DECOMPRESS THE NERVES IN THE BACK OF YOUR LEG BEFORE TAKING OFF .

## 2024-05-12 NOTE — Telephone Encounter (Signed)
 Spoke with pt and scheduled him to see Dr. Tullo today at 2:30

## 2024-05-13 DIAGNOSIS — R29898 Other symptoms and signs involving the musculoskeletal system: Secondary | ICD-10-CM | POA: Insufficient documentation

## 2024-05-13 NOTE — Assessment & Plan Note (Signed)
 Exam is normal with the exception of decreased DTRS bilaterally.  Given his normal pulses and recent follow up by vascular surgery for surveillance of infrarenal aortic aneurysm,  will pursue MRI lumbar spine to rule out compression of nerve root as well as spinal cord lesions

## 2024-05-23 ENCOUNTER — Ambulatory Visit
Admission: RE | Admit: 2024-05-23 | Discharge: 2024-05-23 | Disposition: A | Discharge status: Home / Self Care | Source: Ambulatory Visit | Attending: Internal Medicine | Admitting: Internal Medicine

## 2024-05-23 DIAGNOSIS — G8929 Other chronic pain: Secondary | ICD-10-CM | POA: Diagnosis not present

## 2024-05-23 DIAGNOSIS — R29898 Other symptoms and signs involving the musculoskeletal system: Secondary | ICD-10-CM | POA: Insufficient documentation

## 2024-05-23 DIAGNOSIS — M545 Low back pain, unspecified: Secondary | ICD-10-CM | POA: Insufficient documentation

## 2024-05-23 DIAGNOSIS — M51369 Other intervertebral disc degeneration, lumbar region without mention of lumbar back pain or lower extremity pain: Secondary | ICD-10-CM | POA: Diagnosis not present

## 2024-05-23 DIAGNOSIS — M5126 Other intervertebral disc displacement, lumbar region: Secondary | ICD-10-CM | POA: Diagnosis not present

## 2024-05-23 DIAGNOSIS — M48061 Spinal stenosis, lumbar region without neurogenic claudication: Secondary | ICD-10-CM | POA: Diagnosis not present

## 2024-05-23 DIAGNOSIS — I714 Abdominal aortic aneurysm, without rupture, unspecified: Secondary | ICD-10-CM | POA: Diagnosis not present

## 2024-05-23 MED ORDER — GADOBUTROL 1 MMOL/ML IV SOLN
9.0000 mL | Freq: Once | INTRAVENOUS | Status: AC | PRN
  Administered 2024-05-23: 9 mL via INTRAVENOUS

## 2024-05-25 ENCOUNTER — Ambulatory Visit: Payer: Self-pay | Admitting: Internal Medicine

## 2024-05-26 NOTE — Addendum Note (Signed)
 Addended by: Ho Parisi on: 05/26/2024 09:13 AM   Modules accepted: Orders

## 2024-05-29 NOTE — Progress Notes (Signed)
 Referring Physician:  Marylynn Verneita CROME, MD [address] [place],  [place] [address]  Primary Physician:  Marylynn Verneita CROME, MD  History of Present Illness: 06/03/2024 [name] is here today with a chief complaint of intermittent left leg numbness below the knee.  He also believes this may extend to his foot and has been ongoing for approximately 2 months.  He states that this is exacerbated when he sits for prolonged period of time however has occurred once after being on the treadmill.  He is concerned because when he does have this happen he feels as though his leg is not there and is concerned about his walking.  He has some chronic mild back pain at baseline however his leg pain and associated numbness intermittently is not painful for him.    Duration: 2 month Quality: no pain Severity: 0/10  Precipitating: aggravated by no pattern Modifying factors: made better by no pattern Weakness: none Timing: no pain Bowel/Bladder Dysfunction: none  Conservative measures:  Multimodal medical therapy including regular antiinflammatories:  Tylenol  Injections:  no epidural steroid injections  Past Surgery: no spine surgery  Jamie Zhang has no symptoms of cervical myelopathy.  The symptoms are causing a significant impact on the patient's life.   Review of Systems:  A 10 point review of systems is negative, except for the pertinent positives and negatives detailed in the HPI.  Past Medical History: Past Medical History:  Diagnosis Date   Arthritis    Cataract 01/02/2011   Bilateral - s/p excision   Colon polyps 2000   Hyperplastic - Repeat colonoscopy 2015   GERD (gastroesophageal reflux disease)    Pantoprazole    Hemorrhoids 2006   HLD (hyperlipidemia)    Iron deficiency anemia 03/20/2006   Donating too much blood   Migraine    No longer has problems   Osteopenia    on Fosamax    RLS (restless legs syndrome) 03/20/2006   2/2 iron deficiency   Schatzki's ring     Skin cancer 01/04/2012   Basal cell nose, squamous cell scalp - Dr. Amanda   Sleep apnea     Past Surgical History: Past Surgical History:  Procedure Laterality Date   BREAST BIOPSY Left    benign   Cataract Surgery Bilateral    COLONOSCOPY WITH PROPOFOL  N/A 04/18/2017   Procedure: COLONOSCOPY WITH PROPOFOL ;  Surgeon: Dessa Reyes ORN, MD;  Location: [facility] ENDOSCOPY;  Service: Endoscopy;  Laterality: N/A;   ENDOVASCULAR REPAIR/STENT GRAFT N/A 11/03/2020   Procedure: ENDOVASCULAR REPAIR/STENT GRAFT;  Surgeon: Marea Selinda RAMAN, MD;  Location: [facility] INVASIVE CV LAB;  Service: Cardiovascular;  Laterality: N/A;   EYE SURGERY  CATARACTS   INGUINAL HERNIA REPAIR Left    ROTATOR CUFF REPAIR Left     Allergies: Allergies as of 06/03/2024   (No Known Allergies)    Medications: Outpatient Encounter Medications as of 06/03/2024  Medication Sig   acetaminophen  (TYLENOL ) 325 MG tablet Take 650 mg by mouth every 6 (six) hours as needed.   aspirin  EC 81 MG tablet Take 81 mg by mouth daily.   atorvastatin  (LIPITOR) 20 MG tablet TAKE 1 TABLET BY MOUTH ONCE DAILY   Calcium  Citrate-Vitamin D  (CITRACAL + D PO) Take 1 tablet by mouth daily.   Cholecalciferol  (VITAMIN D3) 10000 UNITS TABS Take 10,000 Units by mouth daily.   docusate sodium  (COLACE) 100 MG capsule Take 100 mg by mouth every other day.   famotidine  (PEPCID ) 40 MG tablet Take 1  tablet by mouth daily.   fluticasone  (FLONASE ) 50 MCG/ACT nasal spray SHAKE LIQUID AND USE 2 SPRAYS IN EACH NOSTRIL DAILY   losartan  (COZAAR ) 50 MG tablet Take 1 tablet (50 mg total) by mouth daily.   Facility-Administered Encounter Medications as of 06/03/2024  Medication   dexamethasone  (DECADRON ) injection 4 mg   triamcinolone  acetonide (KENALOG ) 10 MG/ML injection 10 mg    Social History: Social History   Tobacco Use   Smoking status: Former    Current packs/day: 0.00    Average packs/day: 2.0 packs/day for 21.0 years (42.0 ttl pk-yrs)    Types:  Cigarettes    Start date: 79    Quit date: 1980    Years since quitting: 45.5   Smokeless tobacco: Never  Vaping Use   Vaping status: Never Used  Substance Use Topics   Alcohol use: Yes    Comment: Occasional wine glass   Drug use: No    Family Medical History: Family History  Problem Relation Age of Onset   Alcohol abuse Mother    Arthritis Mother    Hypertension Mother    Cancer Father        Throat Cancer - died   Alcohol abuse Father    Cancer Sister 19       Breast Cancer   Alcohol abuse Sister    Early death Sister    Alcohol abuse Sister    Cancer Sister    Early death Sister     Physical Examination: @VITALWITHPAIN @  General: Patient is well developed, well nourished, calm, collected, and in no apparent distress. Attention to examination is appropriate.  Psychiatric: Patient is non-anxious.  Head:  Pupils equal, round, and reactive to light.  ENT:  Oral mucosa appears well hydrated.  Neck:   Supple.  Full range of motion.  Respiratory: Patient is breathing without any difficulty.  Extremities: No edema.  Vascular: Palpable dorsal pedal pulses.  Skin:   On exposed skin, there are no abnormal skin lesions.  NEUROLOGICAL:     Awake, alert, oriented to person, place, and time.  Speech is clear and fluent. Fund of knowledge is appropriate.   Cranial Nerves: Pupils equal round and reactive to light.  Facial tone is symmetric.     Strength:  Side Iliopsoas Quads Hamstring PF DF EHL  R 5 5 5 5 5 5   L 5 5 5 5 5 5     + tinel of peroneal nerve Trace reflexes throughout bilateral lower extremities. Clonus is not present.  Toes are down-going.  On examination, bilateral lower extremities are intact and equal to light touch. Gait is normal.   No difficulty with tandem gait.   No evidence of dysmetria noted.  Medical Decision Making  Imaging: EXAM: MRI LUMBAR SPINE WITHOUT AND WITH CONTRAST   TECHNIQUE: Multiplanar and multiecho pulse  sequences of the lumbar spine were obtained without and with intravenous contrast.   CONTRAST:  9mL GADAVIST  GADOBUTROL  1 MMOL/ML IV SOLN   COMPARISON:  CTA abdomen 10/20/2020.   FINDINGS: Segmentation:  Normal on the comparison.   Alignment: Stable lumbar lordosis since 2021. No significant scoliosis. Mild degenerative appearing retrolisthesis in the mid lumbar spine including L2-L3 and L3-L4.   Vertebrae: Normal background bone marrow signal. Maintained vertebral height. Intact visible sacrum and SI joints. Chronic degenerative endplate marrow signal changes and endplate spurring. No marrow edema or evidence of acute osseous abnormality.   Conus medullaris and cauda equina: Conus extends to the T12-L1 No lower spinal  cord or conus signal abnormality. No abnormal intradural enhancement. No dural thickening. Level. Conus and cauda equina appear normal.   Paraspinal and other soft tissues: Chronic abdominal aortic aneurysm, lobulated contour and size estimated up to 45 mm diameter, stable (series 8, image 18).   Right renal anterior midpole cortical atrophy since 2021 (series 8, image 7). Otherwise negative visible abdominal viscera. Negative visualized posterior paraspinal soft tissues.   Disc levels:   T11-T12 and   T12-L1:  Negative.   L1-L2: Disc desiccation, circumferential disc bulge and endplate spurring. No significant stenosis.   L2-L3: Mild retrolisthesis superimposed on circumferential disc bulge and endplate spurring. Borderline to mild spinal and lateral recess stenosis (descending L3 nerve levels). Moderate bilateral L2 neural foraminal stenosis, with asymmetric foraminal disc greater on the left (series 8, image 13).   L3-L4: Similar mild retrolisthesis, disc bulging and endplate spurring asymmetric to the right. Mild posterior element hypertrophy. Mild spinal and up to moderate lateral recess stenosis greater on the right (right L4 nerve level series  8, image 18). Moderate left and moderate to severe right L3 neural foraminal stenosis (series 5, image 5 on the right).   L4-L5: Subtle retrolisthesis. Circumferential disc and endplate spurring. Mild posterior element hypertrophy. No significant spinal stenosis. Mild lateral recess stenosis greater on the left (left L5 nerve level). Moderate to severe bilateral L4 neural foraminal stenosis (on the left series 5, image 12).   L5-S1: Circumferential disc bulging and endplate spurring asymmetric to the left. Mild facet hypertrophy. No spinal or lateral recess stenosis. Moderate to severe left and moderate right L5 neural foraminal stenosis.   IMPRESSION: 1. Chronic abdominal aortic aneurysm, with estimated 4.5 cm diameter stable from a 2021 CTA. If un-treated then recommend CTA or MRA, as appropriate, in 12 months. Reference: Journal of Vascular Surgery 67.1 (2018): 2-77. J Am Coll Radiol 2013;10:789-794.   2. 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 Subsequent mild spinal at L3-L4 with moderate lateral recess stenosis at that level greater on the right. Up to mild spinal and lateral recess stenosis at L2-L3. And moderate to severe neural foraminal stenosis at the right L3, bilateral L4, and left L5 nerve levels.      I have personally reviewed the images and agree with the above interpretation.  Assessment and Plan: Mr. Raatz is a pleasant 83 y.o. male is here today with a chief complaint of intermittent left leg numbness below the knee.  He also believes this may extend to his foot and has been ongoing for approximately 2 months.  He states that this is exacerbated when he sits for prolonged period of time however has occurred once after being on the treadmill.  He is concerned because when he does have this happen he feels as though his leg is not there and is concerned about his walking.  He has some chronic mild back pain at  baseline however his leg pain and associated numbness intermittently is not painful for him.  On examination he is full strength.  Does have a positive Tinel of his left peroneal nerve.  At baseline on exam he does not have any differences to sensation.  MRI was reviewed with some moderate to severe neuroforaminal stenosis on the left at L4 and L5.  Pleasure to see patient in clinic today.  Difficult to discern if patient potentially has a peroneal and tibial neuropathy caused by compression or irritation of the sciatic nerve.  Would consider saphenous involvement as  well although patient also has some numbness on the lateral aspect of his calf.  Would like him to undergo nerve conduction study and referral has been placed.  Will review results once complete.    Thank you for involving me in the care of this patient.   I spent a total of 30 minutes in both face-to-face and non-face-to-face activities for this visit on the date of this encounter.  Including preparing to see the patient, obtaining and reviewing separately obtained history, performing medically appropriate examination, counseling the patient, ordering additional test, documenting clinical information, independently interpreting results.  Lyle Decamp, PA-C Dept. of Neurosurgery

## 2024-06-03 ENCOUNTER — Other Ambulatory Visit: Payer: Self-pay

## 2024-06-03 ENCOUNTER — Ambulatory Visit: Admitting: Physician Assistant

## 2024-06-03 ENCOUNTER — Encounter: Payer: Self-pay | Admitting: Physician Assistant

## 2024-06-03 VITALS — BP 118/7 | Ht 68.75 in | Wt 199.4 lb

## 2024-06-03 DIAGNOSIS — R202 Paresthesia of skin: Secondary | ICD-10-CM

## 2024-06-03 DIAGNOSIS — R200 Anesthesia of skin: Secondary | ICD-10-CM | POA: Diagnosis not present

## 2024-06-05 ENCOUNTER — Ambulatory Visit: Admitting: Neurology

## 2024-06-05 DIAGNOSIS — M5416 Radiculopathy, lumbar region: Secondary | ICD-10-CM

## 2024-06-05 DIAGNOSIS — G629 Polyneuropathy, unspecified: Secondary | ICD-10-CM

## 2024-06-05 DIAGNOSIS — R202 Paresthesia of skin: Secondary | ICD-10-CM | POA: Diagnosis not present

## 2024-06-05 NOTE — Procedures (Signed)
 [facility]  [address] [place], [address]  [place], [place] [address] Tel: [phone] Fax: [phone] Test Date:  06/05/2024  Patient: Jamie Zhang DOB: [birth year] Physician: Tonita Blanch, DO  Sex: Male Height: 5' 8 Ref Phys: Lyle Ulis RIGGERS  ID#: [id number]   Technician:    History: This is a 83 year old man referred for evaluation of left lower leg paresthesias.  NCV & EMG Findings: Extensive electrodiagnostic testing of the left lower extremity and additional studies of the right shows:  Bilateral sural and superficial peroneal sensory responses are absent. Bilateral peroneal (EDB) and tibial motor responses show reduced amplitude.  Bilateral peroneal motor responses at the tibialis anterior is within normal limits. Bilateral tibial H reflex study shows prolonged latency. Chronic motor axon loss changes are seen affecting the muscles below the knee as well as the left gluteus medius muscle.  There is no evidence of accompanying active denervation.  Impression: The electrophysiologic findings are most consistent with a chronic and symmetric sensorimotor axonal polyneuropathy affecting the lower extremities. Findings also suggest an overlapping chronic L5 radiculopathy affecting the left lower extremity. Of note, there is no evidence of a left peroneal mononeuropathy.   ___________________________ Tonita Blanch, DO    Nerve Conduction Studies   Stim Site NR Peak (ms) Norm Peak (ms) O-P Amp (V) Norm O-P Amp  Left Sup Peroneal Anti Sensory (Ant Lat Mall)  32 C  12 cm *NR  <4.6  >3  Right Sup Peroneal Anti Sensory (Ant Lat Mall)  32 C  12 cm *NR  <4.6  >3  Left Sural Anti Sensory (Lat Mall)  32 C  Calf *NR  <4.6  >3  Right Sural Anti Sensory (Lat Mall)  32 C  Calf *NR  <4.6  >3     Stim Site NR Onset (ms) Norm Onset (ms) O-P Amp (mV) Norm O-P Amp Site1 Site2 Delta-0 (ms) Dist (cm) Vel (m/s) Norm Vel (m/s)  Left Peroneal Motor (Ext Dig Brev)  32 C   Ankle    4.5 <6.0 *1.1 >2.5 B Fib Ankle 9.3 38.0 41 >40  B Fib    13.8  0.7  Poplt B Fib 1.5 8.0 53 >40  Poplt    15.3  0.6         Right Peroneal Motor (Ext Dig Brev)  32 C  Ankle    3.5 <6.0 *1.9 >2.5 B Fib Ankle 10.1 40.0 40 >40  B Fib    13.6  1.9  Poplt B Fib 2.0 8.0 40 >40  Poplt    15.6  1.8         Left Peroneal TA Motor (Tib Ant)  32 C  Fib Head    3.3 <4.5 4.4 >3 Poplit Fib Head 1.4 8.0 57 >40  Poplit    4.7 <5.7 4.0         Right Peroneal TA Motor (Tib Ant)  32 C  Fib Head    3.4 <4.5 4.8 >3 Poplit Fib Head 1.4 8.0 57 >40  Poplit    4.8 <5.7 4.6         Left Tibial Motor (Abd Hall Brev)  32 C  Ankle    4.2 <6.0 *2.6 >4 Knee Ankle 9.9 40.0 40 >40  Knee    14.1  1.3         Right Tibial Motor (Abd Hall Brev)  32 C  Ankle    4.4 <6.0 *0.7 >4 Knee Ankle  0.0  >40  Knee *NR             Electromyography   Side Muscle Ins.Act Fibs Fasc Recrt Amp Dur Poly Activation Comment  Left AntTibialis Nml Nml Nml *1- *1+ *1+ *1+ Nml N/A  Left Gastroc Nml Nml Nml *1- *1+ *1+ *1+ Nml N/A  Left Flex Dig Long Nml Nml Nml *1- *1+ *1+ *1+ Nml N/A  Left BicepsFemS Nml Nml Nml Nml Nml Nml Nml Nml N/A  Left RectFemoris Nml Nml Nml Nml Nml Nml Nml Nml N/A  Left GluteusMed Nml Nml Nml *1- *1+ *1+ *1+ Nml N/A  Right AntTibialis Nml Nml Nml *1- *1+ *1+ *1+ Nml N/A  Right Gastroc Nml Nml Nml Nml Nml Nml Nml Nml N/A  Right GluteusMed Nml Nml Nml Nml Nml Nml Nml Nml N/A      Waveforms:

## 2024-06-09 ENCOUNTER — Ambulatory Visit: Payer: Self-pay | Admitting: Physician Assistant

## 2024-07-03 DIAGNOSIS — G4733 Obstructive sleep apnea (adult) (pediatric): Secondary | ICD-10-CM | POA: Diagnosis not present

## 2024-08-09 ENCOUNTER — Other Ambulatory Visit: Payer: Self-pay | Admitting: Internal Medicine

## 2024-09-22 DIAGNOSIS — D2262 Melanocytic nevi of left upper limb, including shoulder: Secondary | ICD-10-CM | POA: Diagnosis not present

## 2024-09-22 DIAGNOSIS — D2271 Melanocytic nevi of right lower limb, including hip: Secondary | ICD-10-CM | POA: Diagnosis not present

## 2024-09-22 DIAGNOSIS — L570 Actinic keratosis: Secondary | ICD-10-CM | POA: Diagnosis not present

## 2024-09-22 DIAGNOSIS — C44519 Basal cell carcinoma of skin of other part of trunk: Secondary | ICD-10-CM | POA: Diagnosis not present

## 2024-09-22 DIAGNOSIS — D2272 Melanocytic nevi of left lower limb, including hip: Secondary | ICD-10-CM | POA: Diagnosis not present

## 2024-09-22 DIAGNOSIS — D2261 Melanocytic nevi of right upper limb, including shoulder: Secondary | ICD-10-CM | POA: Diagnosis not present

## 2024-09-22 DIAGNOSIS — D485 Neoplasm of uncertain behavior of skin: Secondary | ICD-10-CM | POA: Diagnosis not present

## 2024-09-22 DIAGNOSIS — D045 Carcinoma in situ of skin of trunk: Secondary | ICD-10-CM | POA: Diagnosis not present

## 2024-09-22 DIAGNOSIS — Z85828 Personal history of other malignant neoplasm of skin: Secondary | ICD-10-CM | POA: Diagnosis not present

## 2024-10-31 ENCOUNTER — Encounter: Payer: Self-pay | Admitting: Internal Medicine

## 2024-10-31 ENCOUNTER — Ambulatory Visit: Admitting: Internal Medicine

## 2024-10-31 VITALS — BP 100/66 | HR 62 | Ht 68.75 in | Wt 202.0 lb

## 2024-10-31 DIAGNOSIS — M545 Low back pain, unspecified: Secondary | ICD-10-CM

## 2024-10-31 DIAGNOSIS — Z636 Dependent relative needing care at home: Secondary | ICD-10-CM | POA: Diagnosis not present

## 2024-10-31 DIAGNOSIS — I10 Essential (primary) hypertension: Secondary | ICD-10-CM

## 2024-10-31 DIAGNOSIS — I7143 Infrarenal abdominal aortic aneurysm, without rupture: Secondary | ICD-10-CM | POA: Diagnosis not present

## 2024-10-31 DIAGNOSIS — R7303 Prediabetes: Secondary | ICD-10-CM

## 2024-10-31 DIAGNOSIS — E663 Overweight: Secondary | ICD-10-CM | POA: Diagnosis not present

## 2024-10-31 DIAGNOSIS — N1831 Chronic kidney disease, stage 3a: Secondary | ICD-10-CM | POA: Diagnosis not present

## 2024-10-31 DIAGNOSIS — Z7189 Other specified counseling: Secondary | ICD-10-CM

## 2024-10-31 DIAGNOSIS — R5383 Other fatigue: Secondary | ICD-10-CM | POA: Diagnosis not present

## 2024-10-31 DIAGNOSIS — E782 Mixed hyperlipidemia: Secondary | ICD-10-CM | POA: Diagnosis not present

## 2024-10-31 DIAGNOSIS — G8929 Other chronic pain: Secondary | ICD-10-CM

## 2024-10-31 LAB — COMPREHENSIVE METABOLIC PANEL WITH GFR
ALT: 14 U/L (ref 3–53)
AST: 14 U/L (ref 5–37)
Albumin: 4.4 g/dL (ref 3.5–5.2)
Alkaline Phosphatase: 50 U/L (ref 39–117)
BUN: 19 mg/dL (ref 6–23)
CO2: 28 meq/L (ref 19–32)
Calcium: 9.8 mg/dL (ref 8.4–10.5)
Chloride: 104 meq/L (ref 96–112)
Creatinine, Ser: 1.06 mg/dL (ref 0.40–1.50)
GFR: 64.43 mL/min
Glucose, Bld: 92 mg/dL (ref 70–99)
Potassium: 4.3 meq/L (ref 3.5–5.1)
Sodium: 140 meq/L (ref 135–145)
Total Bilirubin: 1.9 mg/dL — ABNORMAL HIGH (ref 0.2–1.2)
Total Protein: 6.5 g/dL (ref 6.0–8.3)

## 2024-10-31 LAB — CBC WITH DIFFERENTIAL/PLATELET
Basophils Absolute: 0 K/uL (ref 0.0–0.1)
Basophils Relative: 0.7 % (ref 0.0–3.0)
Eosinophils Absolute: 0.1 K/uL (ref 0.0–0.7)
Eosinophils Relative: 1.1 % (ref 0.0–5.0)
HCT: 40.9 % (ref 39.0–52.0)
Hemoglobin: 13.8 g/dL (ref 13.0–17.0)
Lymphocytes Relative: 12.7 % (ref 12.0–46.0)
Lymphs Abs: 0.9 K/uL (ref 0.7–4.0)
MCHC: 33.6 g/dL (ref 30.0–36.0)
MCV: 93.3 fl (ref 78.0–100.0)
Monocytes Absolute: 0.6 K/uL (ref 0.1–1.0)
Monocytes Relative: 8.5 % (ref 3.0–12.0)
Neutro Abs: 5.2 K/uL (ref 1.4–7.7)
Neutrophils Relative %: 77 % (ref 43.0–77.0)
Platelets: 200 K/uL (ref 150.0–400.0)
RBC: 4.39 Mil/uL (ref 4.22–5.81)
RDW: 13.8 % (ref 11.5–15.5)
WBC: 6.8 K/uL (ref 4.0–10.5)

## 2024-10-31 NOTE — Assessment & Plan Note (Signed)
 Seen by nephrology. Indices have improved with dc NSAIDS.  Continue Follow u p annually with nephrology  Lab Results  Component Value Date   CREATININE 1.00 05/02/2024

## 2024-10-31 NOTE — Assessment & Plan Note (Signed)
 He appears to be handling his situation better based on today's visit.  He is taking time to exercise and take of himself.  He and wife ander are residents of [facility]

## 2024-10-31 NOTE — Patient Instructions (Signed)
 I'm glad your back pain has resolved, and I'm glad you are taking care of yourself!  I recommend that you give NAN chores to do;  people need structure    May the Lord give you peace and joy during this holiday season!  Verneita Kettering, MD

## 2024-10-31 NOTE — Progress Notes (Unsigned)
 "  Subjective:  Patient ID: Jamie Zhang, male    DOB: 01/17/1941  Age: 83 y.o. MRN: [id number]  CC: The primary encounter diagnosis was Chronic midline low back pain without sciatica. Diagnoses of Mixed hyperlipidemia, Prediabetes, Primary hypertension, Overweight (BMI 25.0-29.9), Stage 3a chronic kidney disease (HCC), Other fatigue, DNR (do not resuscitate) discussion, Caregiver stress, and Infrarenal abdominal aortic aneurysm (AAA) without rupture were also pertinent to this visit.   HPI Jamie Zhang presents for  Chief Complaint  Patient presents with   Medical Management of Chronic Issues    6 month follow up     1) lumbar spinal stenosis  by MRI.  Seeing neurosurgery  EMG studies were done and abnormal .   Pain and numbness aggravated by sitting down but has resolved . No weakness on exam today   2)  Hypertension: patient checks blood pressure twice weekly at home.  Readings have been for the most part <130/80 at rest . Patient is following a reduced salt diet most days and is taking medications as prescribed .    3)  Caregiver fatigue/stress: : doing ok,  Jamie Zhang is hard of hearing so he yells at her  he is also HOH so when he forgets to wear his heaing aids he can't understand her .  She is not agitated ,  sleeps well at night , but has become sedentary and lazy. Jamie Zhang is seeing a neurologist and a psychiatrist   4)  exercising 6 days per week     Outpatient Medications Prior to Visit  Medication Sig Dispense Refill   acetaminophen  (TYLENOL ) 325 MG tablet Take 650 mg by mouth every 6 (six) hours as needed.     aspirin  EC 81 MG tablet Take 81 mg by mouth daily.     atorvastatin  (LIPITOR) 20 MG tablet TAKE 1 TABLET BY MOUTH ONCE DAILY 90 tablet 3   Calcium  Citrate-Vitamin D  (CITRACAL + D PO) Take 1 tablet by mouth daily.     Cholecalciferol  (VITAMIN D3) 10000 UNITS TABS Take 10,000 Units by mouth daily.     docusate sodium  (COLACE) 100 MG capsule Take 100 mg by mouth every other day.      famotidine  (PEPCID ) 40 MG tablet Take 1 tablet by mouth daily.     fluticasone  (FLONASE ) 50 MCG/ACT nasal spray SHAKE LIQUID AND USE 2 SPRAYS IN EACH NOSTRIL DAILY 48 g 1   losartan  (COZAAR ) 50 MG tablet TAKE 1 TABLET BY MOUTH ONCE DAILY 90 tablet 1   Facility-Administered Medications Prior to Visit  Medication Dose Route Frequency Provider Last Rate Last Admin   dexamethasone  (DECADRON ) injection 4 mg  4 mg Other Once McDonald, Adam R, DPM       triamcinolone  acetonide (KENALOG ) 10 MG/ML injection 10 mg  10 mg Other Once Silva Juliene SAUNDERS, DPM        Review of Systems;  Patient denies headache, fevers, malaise, unintentional weight loss, skin rash, eye pain, sinus congestion and sinus pain, sore throat, dysphagia,  hemoptysis , cough, dyspnea, wheezing, chest pain, palpitations, orthopnea, edema, abdominal pain, nausea, melena, diarrhea, constipation, flank pain, dysuria, hematuria, urinary  Frequency, nocturia, numbness, tingling, seizures,  Focal weakness, Loss of consciousness,  Tremor, insomnia, depression, anxiety, and suicidal ideation.      Objective:  BP 100/66   Pulse 62   Ht 5' 8.75 (1.746 m)   Wt 202 lb (91.6 kg)   SpO2 98%   BMI 30.05 kg/m   BP Readings from  Last 3 Encounters:  10/31/24 100/66  06/03/24 (!) 118/7  05/12/24 120/66    Wt Readings from Last 3 Encounters:  10/31/24 202 lb (91.6 kg)  06/03/24 199 lb 6 oz (90.4 kg)  05/12/24 200 lb (90.7 kg)    Physical Exam Vitals reviewed.  Constitutional:      General: He is not in acute distress.    Appearance: Normal appearance. He is normal weight. He is not ill-appearing, toxic-appearing or diaphoretic.  HENT:     Head: Normocephalic and atraumatic.     Right Ear: Tympanic membrane, ear canal and external ear normal. There is no impacted cerumen.     Left Ear: Tympanic membrane, ear canal and external ear normal. There is no impacted cerumen.     Nose: Nose normal.     Mouth/Throat:     Mouth: Mucous  membranes are moist.     Pharynx: Oropharynx is clear.  Eyes:     General: No scleral icterus.       Right eye: No discharge.        Left eye: No discharge.     Conjunctiva/sclera: Conjunctivae normal.  Neck:     Thyroid : No thyromegaly.     Vascular: No carotid bruit or JVD.  Cardiovascular:     Rate and Rhythm: Normal rate and regular rhythm.     Heart sounds: Normal heart sounds.  Pulmonary:     Effort: Pulmonary effort is normal. No respiratory distress.     Breath sounds: Normal breath sounds.  Abdominal:     General: Bowel sounds are normal.     Palpations: Abdomen is soft. There is no mass.     Tenderness: There is no abdominal tenderness. There is no guarding or rebound.  Musculoskeletal:        General: Normal range of motion.     Cervical back: Normal range of motion and neck supple.  Lymphadenopathy:     Cervical: No cervical adenopathy.  Skin:    General: Skin is warm and dry.  Neurological:     General: No focal deficit present.     Mental Status: He is alert and oriented to person, place, and time. Mental status is at baseline.  Psychiatric:        Mood and Affect: Mood normal.        Behavior: Behavior normal.        Thought Content: Thought content normal.        Judgment: Judgment normal.     Lab Results  Component Value Date   HGBA1C 5.8 05/02/2024   HGBA1C 5.9 10/24/2023   HGBA1C 6.0 04/23/2023    Lab Results  Component Value Date   CREATININE 1.06 10/31/2024   CREATININE 1.00 05/02/2024   CREATININE 1.05 10/24/2023    Lab Results  Component Value Date   WBC 6.8 10/31/2024   HGB 13.8 10/31/2024   HCT 40.9 10/31/2024   PLT 200.0 10/31/2024   GLUCOSE 92 10/31/2024   CHOL 107 05/02/2024   TRIG 71.0 05/02/2024   HDL 56.90 05/02/2024   LDLDIRECT 34.0 05/02/2024   LDLCALC 36 05/02/2024   ALT 14 10/31/2024   AST 14 10/31/2024   NA 140 10/31/2024   K 4.3 10/31/2024   CL 104 10/31/2024   CREATININE 1.06 10/31/2024   BUN 19 10/31/2024    CO2 28 10/31/2024   TSH 1.95 05/02/2024   PSA 1.19 08/08/2019   INR 1.1 11/01/2020   HGBA1C 5.8 05/02/2024    MR LUMBAR  SPINE W WO CONTRAST Result Date: 05/25/2024 CLINICAL DATA:  83 year old male with persistent, recurrent episodes of left leg weakness and numbness. History of abdominal aortic aneurysm. EXAM: MRI LUMBAR SPINE WITHOUT AND WITH CONTRAST TECHNIQUE: Multiplanar and multiecho pulse sequences of the lumbar spine were obtained without and with intravenous contrast. CONTRAST:  9mL GADAVIST  GADOBUTROL  1 MMOL/ML IV SOLN COMPARISON:  CTA abdomen 10/20/2020. FINDINGS: Segmentation:  Normal on the comparison. Alignment: Stable lumbar lordosis since 2021. No significant scoliosis. Mild degenerative appearing retrolisthesis in the mid lumbar spine including L2-L3 and L3-L4. Vertebrae: Normal background bone marrow signal. Maintained vertebral height. Intact visible sacrum and SI joints. Chronic degenerative endplate marrow signal changes and endplate spurring. No marrow edema or evidence of acute osseous abnormality. Conus medullaris and cauda equina: Conus extends to the T12-L1 No lower spinal cord or conus signal abnormality. No abnormal intradural enhancement. No dural thickening. Level. Conus and cauda equina appear normal. Paraspinal and other soft tissues: Chronic abdominal aortic aneurysm, lobulated contour and size estimated up to 45 mm diameter, stable (series 8, image 18). Right renal anterior midpole cortical atrophy since 2021 (series 8, image 7). Otherwise negative visible abdominal viscera. Negative visualized posterior paraspinal soft tissues. Disc levels: T11-T12 and T12-L1:  Negative. L1-L2: Disc desiccation, circumferential disc bulge and endplate spurring. No significant stenosis. L2-L3: Mild retrolisthesis superimposed on circumferential disc bulge and endplate spurring. Borderline to mild spinal and lateral recess stenosis (descending L3 nerve levels). Moderate bilateral L2 neural  foraminal stenosis, with asymmetric foraminal disc greater on the left (series 8, image 13). L3-L4: Similar mild retrolisthesis, disc bulging and endplate spurring asymmetric to the right. Mild posterior element hypertrophy. Mild spinal and up to moderate lateral recess stenosis greater on the right (right L4 nerve level series 8, image 18). Moderate left and moderate to severe right L3 neural foraminal stenosis (series 5, image 5 on the right). L4-L5: Subtle retrolisthesis. Circumferential disc and endplate spurring. Mild posterior element hypertrophy. No significant spinal stenosis. Mild lateral recess stenosis greater on the left (left L5 nerve level). Moderate to severe bilateral L4 neural foraminal stenosis (on the left series 5, image 12). L5-S1: Circumferential disc bulging and endplate spurring asymmetric to the left. Mild facet hypertrophy. No spinal or lateral recess stenosis. Moderate to severe left and moderate right L5 neural foraminal stenosis. IMPRESSION: 1. Chronic abdominal aortic aneurysm, with estimated 4.5 cm diameter stable from a 2021 CTA. If un-treated then recommend CTA or MRA, as appropriate, in 12 months. Reference: Journal of Vascular Surgery 67.1 (2018): 2-77. J Am Coll Radiol 2013;10:789-794. 2. 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 Subsequent mild spinal at L3-L4 with moderate lateral recess stenosis at that level greater on the right. Up to mild spinal and lateral recess stenosis at L2-L3. And moderate to severe neural foraminal stenosis at the right L3, bilateral L4, and left L5 nerve levels. Electronically Signed   By: VEAR Hurst M.D.   On: 05/25/2024 15:49    Assessment & Plan:  .Chronic midline low back pain without sciatica Assessment & Plan: Pain has currently resolved despite spinal stenosis    Mixed hyperlipidemia Assessment & Plan: Managed with statin therapy,  LDL is at goal of < 70. LFTs normal .  No changes  today  Lab Results  Component Value Date   CHOL 107 05/02/2024   HDL 56.90 05/02/2024   LDLCALC 36 05/02/2024   LDLDIRECT 34.0 05/02/2024   TRIG 71.0 05/02/2024   CHOLHDL 2 05/02/2024  Lab Results  Component Value Date   ALT 14 10/31/2024   AST 14 10/31/2024   ALKPHOS 50 10/31/2024   BILITOT 1.9 (H) 10/31/2024      Orders: -     Comprehensive metabolic panel with GFR  Prediabetes  Primary hypertension Assessment & Plan: Reviewed goals of BP management with goal 120/70 due to CKD and AAA.  Continue losartan  50 mg daily. RN visit needed to calibrate home machine for monitoring   Overweight (BMI 25.0-29.9) -     CBC with Differential/Platelet  Stage 3a chronic kidney disease (HCC) Assessment & Plan: Seen by nephrology. Indices have improved with dc NSAIDS.  Continue Follow u p annually with nephrology  Lab Results  Component Value Date   CREATININE 1.00 05/02/2024      Other fatigue -     CBC with Differential/Platelet  DNR (do not resuscitate) discussion -     Do not attempt resuscitation (DNR)  Caregiver stress Assessment & Plan: He appears to be handling his situation better based on today's visit.  He is taking time to exercise and take of himself.  He and wife Jamie Zhang are residents of [facility]   Infrarenal abdominal aortic aneurysm (AAA) without rupture Assessment & Plan: Annual follow up with Dr Marea was done  in April.  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 Diameter is decreasing        Follow-up: Return in about 6 months (around 05/01/2025).   Jamie LITTIE Kettering, MD "

## 2024-10-31 NOTE — Assessment & Plan Note (Signed)
 Pain has currently resolved despite spinal stenosis

## 2024-11-02 ENCOUNTER — Ambulatory Visit: Payer: Self-pay | Admitting: Internal Medicine

## 2024-11-02 NOTE — Assessment & Plan Note (Signed)
 Annual follow up with Dr Wyn Quaker was done  in April.  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 Diameter is decreasing

## 2024-11-02 NOTE — Assessment & Plan Note (Signed)
 Reviewed goals of BP management with goal 120/70 due to CKD and AAA.  Continue losartan  50 mg daily. RN visit needed to calibrate home machine for monitoring

## 2024-11-02 NOTE — Assessment & Plan Note (Signed)
 Managed with statin therapy,  LDL is at goal of < 70. LFTs normal .  No changes today  Lab Results  Component Value Date   CHOL 107 05/02/2024   HDL 56.90 05/02/2024   LDLCALC 36 05/02/2024   LDLDIRECT 34.0 05/02/2024   TRIG 71.0 05/02/2024   CHOLHDL 2 05/02/2024    Lab Results  Component Value Date   ALT 14 10/31/2024   AST 14 10/31/2024   ALKPHOS 50 10/31/2024   BILITOT 1.9 (H) 10/31/2024

## 2025-01-23 ENCOUNTER — Ambulatory Visit: Payer: Self-pay

## 2025-02-18 ENCOUNTER — Ambulatory Visit: Admitting: Sleep Medicine

## 2025-03-06 ENCOUNTER — Other Ambulatory Visit (INDEPENDENT_AMBULATORY_CARE_PROVIDER_SITE_OTHER)

## 2025-03-06 ENCOUNTER — Ambulatory Visit (INDEPENDENT_AMBULATORY_CARE_PROVIDER_SITE_OTHER): Admitting: Vascular Surgery

## 2025-05-04 ENCOUNTER — Ambulatory Visit: Admitting: Internal Medicine
